# Patient Record
Sex: Male | Born: 1965 | Race: White | Hispanic: No | Smoking: Current every day smoker
Health system: Southern US, Community
[De-identification: ages and names within clinical notes are randomized; demographics above are authoritative.]

## PROBLEM LIST (undated history)

## (undated) DIAGNOSIS — M549 Dorsalgia, unspecified: Secondary | ICD-10-CM

## (undated) DIAGNOSIS — E785 Hyperlipidemia, unspecified: Secondary | ICD-10-CM

## (undated) DIAGNOSIS — E119 Type 2 diabetes mellitus without complications: Secondary | ICD-10-CM

## (undated) DIAGNOSIS — F329 Major depressive disorder, single episode, unspecified: Secondary | ICD-10-CM

## (undated) DIAGNOSIS — N200 Calculus of kidney: Secondary | ICD-10-CM

## (undated) DIAGNOSIS — K589 Irritable bowel syndrome without diarrhea: Secondary | ICD-10-CM

## (undated) DIAGNOSIS — F209 Schizophrenia, unspecified: Secondary | ICD-10-CM

## (undated) DIAGNOSIS — J45909 Unspecified asthma, uncomplicated: Secondary | ICD-10-CM

## (undated) DIAGNOSIS — F319 Bipolar disorder, unspecified: Secondary | ICD-10-CM

## (undated) DIAGNOSIS — I10 Essential (primary) hypertension: Secondary | ICD-10-CM

## (undated) DIAGNOSIS — IMO0002 Reserved for concepts with insufficient information to code with codable children: Secondary | ICD-10-CM

## (undated) DIAGNOSIS — I4510 Unspecified right bundle-branch block: Secondary | ICD-10-CM

## (undated) DIAGNOSIS — G44209 Tension-type headache, unspecified, not intractable: Secondary | ICD-10-CM

## (undated) DIAGNOSIS — T1491XA Suicide attempt, initial encounter: Secondary | ICD-10-CM

## (undated) DIAGNOSIS — R440 Auditory hallucinations: Secondary | ICD-10-CM

## (undated) DIAGNOSIS — K219 Gastro-esophageal reflux disease without esophagitis: Secondary | ICD-10-CM

## (undated) DIAGNOSIS — M1990 Unspecified osteoarthritis, unspecified site: Secondary | ICD-10-CM

## (undated) DIAGNOSIS — E669 Obesity, unspecified: Secondary | ICD-10-CM

## (undated) DIAGNOSIS — K760 Fatty (change of) liver, not elsewhere classified: Secondary | ICD-10-CM

## (undated) DIAGNOSIS — J449 Chronic obstructive pulmonary disease, unspecified: Secondary | ICD-10-CM

## (undated) DIAGNOSIS — F419 Anxiety disorder, unspecified: Secondary | ICD-10-CM

## (undated) DIAGNOSIS — G8929 Other chronic pain: Secondary | ICD-10-CM

## (undated) DIAGNOSIS — N289 Disorder of kidney and ureter, unspecified: Secondary | ICD-10-CM

## (undated) HISTORY — DX: Reserved for concepts with insufficient information to code with codable children: IMO0002

## (undated) HISTORY — DX: Major depressive disorder, single episode, unspecified: F32.9

## (undated) HISTORY — DX: Unspecified osteoarthritis, unspecified site: M19.90

## (undated) HISTORY — DX: Irritable bowel syndrome, unspecified: K58.9

## (undated) HISTORY — DX: Gastro-esophageal reflux disease without esophagitis: K21.9

## (undated) HISTORY — PX: OTHER SURGICAL HISTORY: SHX169

## (undated) HISTORY — DX: Type 2 diabetes mellitus without complications: E11.9

## (undated) HISTORY — DX: Unspecified right bundle-branch block: I45.10

## (undated) HISTORY — DX: Calculus of kidney: N20.0

## (undated) HISTORY — DX: Hyperlipidemia, unspecified: E78.5

## (undated) HISTORY — DX: Bipolar disorder, unspecified: F31.9

## (undated) HISTORY — DX: Essential (primary) hypertension: I10

## (undated) HISTORY — DX: Auditory hallucinations: R44.0

## (undated) HISTORY — DX: Tension-type headache, unspecified, not intractable: G44.209

## (undated) HISTORY — DX: Suicide attempt, initial encounter: T14.91XA

## (undated) HISTORY — DX: Dorsalgia, unspecified: M54.9

## (undated) HISTORY — DX: Anxiety disorder, unspecified: F41.9

## (undated) HISTORY — DX: Fatty (change of) liver, not elsewhere classified: K76.0

## (undated) HISTORY — DX: Schizophrenia, unspecified: F20.9

## (undated) HISTORY — DX: Obesity, unspecified: E66.9

## (undated) HISTORY — DX: Other chronic pain: G89.29

---

## 1995-02-27 DIAGNOSIS — F32A Depression, unspecified: Secondary | ICD-10-CM

## 1995-02-27 HISTORY — DX: Depression, unspecified: F32.A

## 1997-09-13 ENCOUNTER — Emergency Department (HOSPITAL_COMMUNITY): Admission: EM | Admit: 1997-09-13 | Discharge: 1997-09-13 | Payer: Self-pay | Admitting: Emergency Medicine

## 1999-02-27 HISTORY — PX: OTHER SURGICAL HISTORY: SHX169

## 2000-08-22 ENCOUNTER — Emergency Department (HOSPITAL_COMMUNITY): Admission: EM | Admit: 2000-08-22 | Discharge: 2000-08-22 | Payer: Self-pay | Admitting: Emergency Medicine

## 2000-09-25 ENCOUNTER — Inpatient Hospital Stay (HOSPITAL_COMMUNITY): Admission: EM | Admit: 2000-09-25 | Discharge: 2000-09-28 | Payer: Self-pay | Admitting: *Deleted

## 2000-11-26 ENCOUNTER — Ambulatory Visit (HOSPITAL_COMMUNITY): Admission: RE | Admit: 2000-11-26 | Discharge: 2000-11-26 | Payer: Self-pay | Admitting: Orthopaedic Surgery

## 2000-12-06 ENCOUNTER — Emergency Department (HOSPITAL_COMMUNITY): Admission: EM | Admit: 2000-12-06 | Discharge: 2000-12-06 | Payer: Self-pay | Admitting: Emergency Medicine

## 2000-12-11 ENCOUNTER — Encounter: Payer: Self-pay | Admitting: Orthopedic Surgery

## 2000-12-11 ENCOUNTER — Ambulatory Visit (HOSPITAL_COMMUNITY): Admission: RE | Admit: 2000-12-11 | Discharge: 2000-12-11 | Payer: Self-pay | Admitting: Orthopedic Surgery

## 2001-02-26 DIAGNOSIS — I10 Essential (primary) hypertension: Secondary | ICD-10-CM

## 2001-02-26 HISTORY — DX: Essential (primary) hypertension: I10

## 2002-06-19 ENCOUNTER — Emergency Department (HOSPITAL_COMMUNITY): Admission: EM | Admit: 2002-06-19 | Discharge: 2002-06-19 | Payer: Self-pay | Admitting: Internal Medicine

## 2002-06-19 ENCOUNTER — Encounter: Payer: Self-pay | Admitting: Internal Medicine

## 2003-02-27 DIAGNOSIS — T1491XA Suicide attempt, initial encounter: Secondary | ICD-10-CM

## 2003-02-27 HISTORY — DX: Suicide attempt, initial encounter: T14.91XA

## 2003-11-19 ENCOUNTER — Ambulatory Visit (HOSPITAL_COMMUNITY): Admission: RE | Admit: 2003-11-19 | Discharge: 2003-11-19 | Payer: Self-pay | Admitting: Orthopaedic Surgery

## 2003-12-14 ENCOUNTER — Inpatient Hospital Stay (HOSPITAL_COMMUNITY): Admission: RE | Admit: 2003-12-14 | Discharge: 2003-12-20 | Payer: Self-pay | Admitting: Psychiatry

## 2003-12-14 ENCOUNTER — Ambulatory Visit: Payer: Self-pay | Admitting: Psychiatry

## 2004-01-03 ENCOUNTER — Ambulatory Visit: Payer: Self-pay | Admitting: Psychiatry

## 2004-01-10 ENCOUNTER — Ambulatory Visit: Payer: Self-pay | Admitting: Psychiatry

## 2004-01-17 ENCOUNTER — Ambulatory Visit: Payer: Self-pay | Admitting: Psychiatry

## 2004-02-11 ENCOUNTER — Ambulatory Visit: Payer: Self-pay | Admitting: Psychiatry

## 2004-02-25 ENCOUNTER — Ambulatory Visit (HOSPITAL_COMMUNITY): Admission: RE | Admit: 2004-02-25 | Discharge: 2004-02-25 | Payer: Self-pay | Admitting: Family Medicine

## 2004-03-10 ENCOUNTER — Ambulatory Visit (HOSPITAL_COMMUNITY): Admission: RE | Admit: 2004-03-10 | Discharge: 2004-03-10 | Payer: Self-pay | Admitting: General Surgery

## 2004-03-24 ENCOUNTER — Ambulatory Visit: Payer: Self-pay | Admitting: Psychiatry

## 2004-05-03 ENCOUNTER — Ambulatory Visit: Payer: Self-pay | Admitting: Psychiatry

## 2004-06-15 ENCOUNTER — Ambulatory Visit: Payer: Self-pay | Admitting: Psychiatry

## 2004-06-29 ENCOUNTER — Ambulatory Visit: Payer: Self-pay | Admitting: Psychiatry

## 2004-07-17 ENCOUNTER — Emergency Department (HOSPITAL_COMMUNITY): Admission: EM | Admit: 2004-07-17 | Discharge: 2004-07-17 | Payer: Self-pay | Admitting: Emergency Medicine

## 2004-07-22 ENCOUNTER — Ambulatory Visit (HOSPITAL_COMMUNITY): Admission: RE | Admit: 2004-07-22 | Discharge: 2004-07-22 | Payer: Self-pay | Admitting: Orthopaedic Surgery

## 2004-08-09 ENCOUNTER — Ambulatory Visit: Payer: Self-pay | Admitting: Psychiatry

## 2004-08-30 ENCOUNTER — Emergency Department (HOSPITAL_COMMUNITY): Admission: EM | Admit: 2004-08-30 | Discharge: 2004-08-30 | Payer: Self-pay | Admitting: Emergency Medicine

## 2004-09-08 ENCOUNTER — Emergency Department (HOSPITAL_COMMUNITY): Admission: EM | Admit: 2004-09-08 | Discharge: 2004-09-08 | Payer: Self-pay | Admitting: Emergency Medicine

## 2004-09-12 ENCOUNTER — Ambulatory Visit: Payer: Self-pay | Admitting: Psychiatry

## 2004-10-31 ENCOUNTER — Ambulatory Visit: Payer: Self-pay | Admitting: Psychiatry

## 2004-12-10 ENCOUNTER — Emergency Department (HOSPITAL_COMMUNITY): Admission: EM | Admit: 2004-12-10 | Discharge: 2004-12-10 | Payer: Self-pay | Admitting: Emergency Medicine

## 2004-12-11 ENCOUNTER — Ambulatory Visit: Payer: Self-pay | Admitting: Psychiatry

## 2004-12-22 ENCOUNTER — Ambulatory Visit (HOSPITAL_COMMUNITY): Admission: RE | Admit: 2004-12-22 | Discharge: 2004-12-22 | Payer: Self-pay | Admitting: Orthopaedic Surgery

## 2005-01-16 ENCOUNTER — Ambulatory Visit: Payer: Self-pay | Admitting: Psychiatry

## 2005-02-05 ENCOUNTER — Ambulatory Visit: Payer: Self-pay | Admitting: Psychiatry

## 2005-03-21 ENCOUNTER — Ambulatory Visit: Payer: Self-pay | Admitting: Psychiatry

## 2005-04-04 ENCOUNTER — Ambulatory Visit: Payer: Self-pay | Admitting: Psychiatry

## 2005-05-03 ENCOUNTER — Ambulatory Visit (HOSPITAL_COMMUNITY): Payer: Self-pay | Admitting: Psychiatry

## 2005-05-15 ENCOUNTER — Ambulatory Visit (HOSPITAL_COMMUNITY): Payer: Self-pay | Admitting: Psychiatry

## 2005-05-29 ENCOUNTER — Ambulatory Visit (HOSPITAL_COMMUNITY): Payer: Self-pay | Admitting: Psychiatry

## 2005-06-07 ENCOUNTER — Ambulatory Visit (HOSPITAL_COMMUNITY): Payer: Self-pay | Admitting: Psychiatry

## 2005-06-14 ENCOUNTER — Ambulatory Visit (HOSPITAL_COMMUNITY): Payer: Self-pay | Admitting: Psychiatry

## 2005-06-28 ENCOUNTER — Ambulatory Visit (HOSPITAL_COMMUNITY): Payer: Self-pay | Admitting: Psychiatry

## 2005-07-19 ENCOUNTER — Ambulatory Visit (HOSPITAL_COMMUNITY): Payer: Self-pay | Admitting: Psychiatry

## 2005-08-07 ENCOUNTER — Ambulatory Visit (HOSPITAL_COMMUNITY): Payer: Self-pay | Admitting: Psychiatry

## 2005-08-09 ENCOUNTER — Ambulatory Visit (HOSPITAL_COMMUNITY): Payer: Self-pay | Admitting: Psychiatry

## 2005-08-30 ENCOUNTER — Ambulatory Visit (HOSPITAL_COMMUNITY): Payer: Self-pay | Admitting: Psychiatry

## 2005-09-18 ENCOUNTER — Ambulatory Visit (HOSPITAL_COMMUNITY): Payer: Self-pay | Admitting: Psychiatry

## 2005-10-02 ENCOUNTER — Ambulatory Visit (HOSPITAL_COMMUNITY): Payer: Self-pay | Admitting: Psychiatry

## 2005-10-04 ENCOUNTER — Ambulatory Visit (HOSPITAL_COMMUNITY): Payer: Self-pay | Admitting: Psychiatry

## 2005-10-08 ENCOUNTER — Ambulatory Visit: Payer: Self-pay | Admitting: Family Medicine

## 2005-10-12 ENCOUNTER — Encounter (INDEPENDENT_AMBULATORY_CARE_PROVIDER_SITE_OTHER): Payer: Self-pay | Admitting: Family Medicine

## 2005-10-22 ENCOUNTER — Ambulatory Visit: Payer: Self-pay | Admitting: Family Medicine

## 2005-10-23 ENCOUNTER — Ambulatory Visit (HOSPITAL_COMMUNITY): Payer: Self-pay | Admitting: Psychiatry

## 2005-11-06 ENCOUNTER — Ambulatory Visit (HOSPITAL_COMMUNITY): Payer: Self-pay | Admitting: Psychiatry

## 2005-11-22 ENCOUNTER — Encounter (INDEPENDENT_AMBULATORY_CARE_PROVIDER_SITE_OTHER): Payer: Self-pay | Admitting: Family Medicine

## 2005-11-27 ENCOUNTER — Ambulatory Visit (HOSPITAL_COMMUNITY): Payer: Self-pay | Admitting: Psychiatry

## 2005-11-29 ENCOUNTER — Ambulatory Visit (HOSPITAL_COMMUNITY): Payer: Self-pay | Admitting: Psychiatry

## 2005-12-03 ENCOUNTER — Ambulatory Visit: Payer: Self-pay | Admitting: Family Medicine

## 2005-12-04 ENCOUNTER — Encounter (INDEPENDENT_AMBULATORY_CARE_PROVIDER_SITE_OTHER): Payer: Self-pay | Admitting: Family Medicine

## 2005-12-17 ENCOUNTER — Ambulatory Visit: Payer: Self-pay | Admitting: Family Medicine

## 2005-12-18 ENCOUNTER — Encounter (INDEPENDENT_AMBULATORY_CARE_PROVIDER_SITE_OTHER): Payer: Self-pay | Admitting: Family Medicine

## 2005-12-18 ENCOUNTER — Ambulatory Visit (HOSPITAL_COMMUNITY): Admission: RE | Admit: 2005-12-18 | Discharge: 2005-12-18 | Payer: Self-pay | Admitting: Family Medicine

## 2005-12-19 ENCOUNTER — Ambulatory Visit (HOSPITAL_COMMUNITY): Payer: Self-pay | Admitting: Psychiatry

## 2005-12-21 ENCOUNTER — Telehealth (INDEPENDENT_AMBULATORY_CARE_PROVIDER_SITE_OTHER): Payer: Self-pay | Admitting: Family Medicine

## 2005-12-27 ENCOUNTER — Ambulatory Visit (HOSPITAL_COMMUNITY): Payer: Self-pay | Admitting: Psychiatry

## 2006-01-07 ENCOUNTER — Ambulatory Visit: Payer: Self-pay | Admitting: Family Medicine

## 2006-01-09 ENCOUNTER — Ambulatory Visit (HOSPITAL_COMMUNITY): Payer: Self-pay | Admitting: Psychiatry

## 2006-01-11 ENCOUNTER — Encounter (INDEPENDENT_AMBULATORY_CARE_PROVIDER_SITE_OTHER): Payer: Self-pay | Admitting: Family Medicine

## 2006-01-24 ENCOUNTER — Ambulatory Visit (HOSPITAL_COMMUNITY): Payer: Self-pay | Admitting: Psychiatry

## 2006-02-04 ENCOUNTER — Ambulatory Visit: Payer: Self-pay | Admitting: Family Medicine

## 2006-02-14 ENCOUNTER — Ambulatory Visit (HOSPITAL_COMMUNITY): Payer: Self-pay | Admitting: Psychiatry

## 2006-02-28 ENCOUNTER — Ambulatory Visit (HOSPITAL_COMMUNITY): Payer: Self-pay | Admitting: Psychiatry

## 2006-03-07 ENCOUNTER — Telehealth (INDEPENDENT_AMBULATORY_CARE_PROVIDER_SITE_OTHER): Payer: Self-pay | Admitting: Family Medicine

## 2006-03-07 ENCOUNTER — Ambulatory Visit (HOSPITAL_COMMUNITY): Payer: Self-pay | Admitting: Psychiatry

## 2006-03-08 ENCOUNTER — Encounter (INDEPENDENT_AMBULATORY_CARE_PROVIDER_SITE_OTHER): Payer: Self-pay | Admitting: Family Medicine

## 2006-03-18 ENCOUNTER — Ambulatory Visit: Payer: Self-pay | Admitting: Family Medicine

## 2006-04-04 ENCOUNTER — Ambulatory Visit (HOSPITAL_COMMUNITY): Payer: Self-pay | Admitting: Psychiatry

## 2006-04-29 ENCOUNTER — Ambulatory Visit: Payer: Self-pay | Admitting: Family Medicine

## 2006-04-29 ENCOUNTER — Encounter: Payer: Self-pay | Admitting: Family Medicine

## 2006-04-29 DIAGNOSIS — K7689 Other specified diseases of liver: Secondary | ICD-10-CM

## 2006-04-29 DIAGNOSIS — E663 Overweight: Secondary | ICD-10-CM

## 2006-04-29 DIAGNOSIS — K589 Irritable bowel syndrome without diarrhea: Secondary | ICD-10-CM | POA: Insufficient documentation

## 2006-04-29 DIAGNOSIS — F411 Generalized anxiety disorder: Secondary | ICD-10-CM | POA: Insufficient documentation

## 2006-04-29 DIAGNOSIS — F319 Bipolar disorder, unspecified: Secondary | ICD-10-CM

## 2006-04-29 DIAGNOSIS — E785 Hyperlipidemia, unspecified: Secondary | ICD-10-CM

## 2006-04-29 DIAGNOSIS — I10 Essential (primary) hypertension: Secondary | ICD-10-CM

## 2006-04-29 DIAGNOSIS — F329 Major depressive disorder, single episode, unspecified: Secondary | ICD-10-CM

## 2006-04-29 DIAGNOSIS — M549 Dorsalgia, unspecified: Secondary | ICD-10-CM | POA: Insufficient documentation

## 2006-04-29 LAB — CONVERTED CEMR LAB
Cholesterol, target level: 200 mg/dL
HDL goal, serum: 40 mg/dL
LDL Goal: 160 mg/dL

## 2006-05-01 ENCOUNTER — Encounter (INDEPENDENT_AMBULATORY_CARE_PROVIDER_SITE_OTHER): Payer: Self-pay | Admitting: Family Medicine

## 2006-05-02 ENCOUNTER — Ambulatory Visit (HOSPITAL_COMMUNITY): Payer: Self-pay | Admitting: Psychiatry

## 2006-05-03 LAB — CONVERTED CEMR LAB
AST: 7 units/L (ref 0–37)
Albumin: 5.1 g/dL (ref 3.5–5.2)
BUN: 19 mg/dL (ref 6–23)
Basophils Relative: 0 % (ref 0–1)
Calcium: 9.7 mg/dL (ref 8.4–10.5)
Chloride: 105 meq/L (ref 96–112)
Glucose, Bld: 91 mg/dL (ref 70–99)
HDL: 36 mg/dL — ABNORMAL LOW (ref >39)
Lymphs Abs: 3 10*3/uL (ref 0.7–3.3)
Monocytes Relative: 6 % (ref 3–11)
Neutro Abs: 5.2 10*3/uL (ref 1.7–7.7)
Neutrophils Relative %: 54 % (ref 43–77)
Potassium: 5.4 meq/L — ABNORMAL HIGH (ref 3.5–5.3)
RBC: 5.09 M/uL (ref 4.22–5.81)
TSH: 1.893 microintl units/mL (ref 0.350–5.50)
WBC: 9.7 10*3/uL (ref 4.0–10.5)

## 2006-05-06 ENCOUNTER — Ambulatory Visit (HOSPITAL_COMMUNITY): Admission: RE | Admit: 2006-05-06 | Discharge: 2006-05-06 | Payer: Self-pay | Admitting: Family Medicine

## 2006-05-06 ENCOUNTER — Encounter (INDEPENDENT_AMBULATORY_CARE_PROVIDER_SITE_OTHER): Payer: Self-pay | Admitting: Family Medicine

## 2006-05-28 ENCOUNTER — Ambulatory Visit: Payer: Self-pay | Admitting: Family Medicine

## 2006-05-29 ENCOUNTER — Encounter (INDEPENDENT_AMBULATORY_CARE_PROVIDER_SITE_OTHER): Payer: Self-pay | Admitting: Family Medicine

## 2006-05-29 ENCOUNTER — Ambulatory Visit (HOSPITAL_COMMUNITY): Admission: RE | Admit: 2006-05-29 | Discharge: 2006-05-29 | Payer: Self-pay | Admitting: Family Medicine

## 2006-05-29 LAB — CONVERTED CEMR LAB: Potassium: 4.3 meq/L (ref 3.5–5.3)

## 2006-05-30 ENCOUNTER — Ambulatory Visit (HOSPITAL_COMMUNITY): Payer: Self-pay | Admitting: Psychiatry

## 2006-05-31 ENCOUNTER — Ambulatory Visit (HOSPITAL_COMMUNITY): Payer: Self-pay | Admitting: Psychiatry

## 2006-06-04 ENCOUNTER — Encounter (INDEPENDENT_AMBULATORY_CARE_PROVIDER_SITE_OTHER): Payer: Self-pay | Admitting: Family Medicine

## 2006-06-21 ENCOUNTER — Ambulatory Visit (HOSPITAL_COMMUNITY): Payer: Self-pay | Admitting: Psychiatry

## 2006-07-03 ENCOUNTER — Encounter (INDEPENDENT_AMBULATORY_CARE_PROVIDER_SITE_OTHER): Payer: Self-pay | Admitting: Family Medicine

## 2006-07-09 ENCOUNTER — Ambulatory Visit: Payer: Self-pay | Admitting: Family Medicine

## 2006-07-10 ENCOUNTER — Encounter (INDEPENDENT_AMBULATORY_CARE_PROVIDER_SITE_OTHER): Payer: Self-pay | Admitting: Family Medicine

## 2006-07-11 LAB — CONVERTED CEMR LAB
ALT: 18 units/L (ref 0–53)
AST: 7 units/L (ref 0–37)
Albumin: 4.8 g/dL (ref 3.5–5.2)
Alkaline Phosphatase: 72 units/L (ref 39–117)
Glucose, Bld: 96 mg/dL (ref 70–99)
Potassium: 4.7 meq/L (ref 3.5–5.3)
Sodium: 135 meq/L (ref 135–145)
Total Protein: 7.3 g/dL (ref 6.0–8.3)

## 2006-07-17 ENCOUNTER — Ambulatory Visit (HOSPITAL_COMMUNITY): Payer: Self-pay | Admitting: Psychiatry

## 2006-08-07 ENCOUNTER — Ambulatory Visit (HOSPITAL_COMMUNITY): Payer: Self-pay | Admitting: Psychiatry

## 2006-08-26 ENCOUNTER — Ambulatory Visit: Payer: Self-pay | Admitting: Family Medicine

## 2006-08-27 ENCOUNTER — Ambulatory Visit (HOSPITAL_COMMUNITY): Payer: Self-pay | Admitting: Psychiatry

## 2006-08-28 ENCOUNTER — Ambulatory Visit (HOSPITAL_COMMUNITY): Payer: Self-pay | Admitting: Psychiatry

## 2006-09-17 ENCOUNTER — Ambulatory Visit (HOSPITAL_COMMUNITY): Payer: Self-pay | Admitting: Psychiatry

## 2006-10-01 ENCOUNTER — Ambulatory Visit (HOSPITAL_COMMUNITY): Payer: Self-pay | Admitting: Psychiatry

## 2006-10-04 ENCOUNTER — Encounter (INDEPENDENT_AMBULATORY_CARE_PROVIDER_SITE_OTHER): Payer: Self-pay | Admitting: Family Medicine

## 2006-10-09 ENCOUNTER — Encounter (INDEPENDENT_AMBULATORY_CARE_PROVIDER_SITE_OTHER): Payer: Self-pay | Admitting: Family Medicine

## 2006-10-14 ENCOUNTER — Ambulatory Visit (HOSPITAL_COMMUNITY): Payer: Self-pay | Admitting: Psychiatry

## 2006-10-29 ENCOUNTER — Ambulatory Visit (HOSPITAL_COMMUNITY): Payer: Self-pay | Admitting: Psychiatry

## 2006-11-11 ENCOUNTER — Ambulatory Visit (HOSPITAL_COMMUNITY): Payer: Self-pay | Admitting: Psychiatry

## 2006-11-18 ENCOUNTER — Ambulatory Visit (HOSPITAL_COMMUNITY): Payer: Self-pay | Admitting: Psychiatry

## 2006-11-21 ENCOUNTER — Ambulatory Visit: Payer: Self-pay | Admitting: Family Medicine

## 2006-11-25 ENCOUNTER — Encounter (INDEPENDENT_AMBULATORY_CARE_PROVIDER_SITE_OTHER): Payer: Self-pay | Admitting: Family Medicine

## 2006-11-28 ENCOUNTER — Encounter (INDEPENDENT_AMBULATORY_CARE_PROVIDER_SITE_OTHER): Payer: Self-pay | Admitting: Family Medicine

## 2006-11-28 ENCOUNTER — Ambulatory Visit (HOSPITAL_COMMUNITY): Payer: Self-pay | Admitting: Psychiatry

## 2006-11-29 LAB — CONVERTED CEMR LAB
Basophils Absolute: 0.1 10*3/uL (ref 0.0–0.1)
Basophils Relative: 1 % (ref 0–1)
Eosinophils Absolute: 1.1 10*3/uL — ABNORMAL HIGH (ref 0.0–0.7)
MCHC: 34.1 g/dL (ref 30.0–36.0)
Neutro Abs: 5.9 10*3/uL (ref 1.7–7.7)
Neutrophils Relative %: 55 % (ref 43–77)
Platelets: 404 10*3/uL — ABNORMAL HIGH (ref 150–400)
RBC: 4.95 M/uL (ref 4.22–5.81)
RDW: 14.8 % — ABNORMAL HIGH (ref 11.5–14.0)

## 2006-12-03 ENCOUNTER — Telehealth (INDEPENDENT_AMBULATORY_CARE_PROVIDER_SITE_OTHER): Payer: Self-pay | Admitting: *Deleted

## 2006-12-04 ENCOUNTER — Encounter (INDEPENDENT_AMBULATORY_CARE_PROVIDER_SITE_OTHER): Payer: Self-pay | Admitting: Family Medicine

## 2006-12-05 ENCOUNTER — Encounter (INDEPENDENT_AMBULATORY_CARE_PROVIDER_SITE_OTHER): Payer: Self-pay | Admitting: Family Medicine

## 2006-12-05 ENCOUNTER — Ambulatory Visit (HOSPITAL_COMMUNITY): Payer: Self-pay | Admitting: Psychiatry

## 2006-12-17 ENCOUNTER — Encounter (INDEPENDENT_AMBULATORY_CARE_PROVIDER_SITE_OTHER): Payer: Self-pay | Admitting: Family Medicine

## 2006-12-19 ENCOUNTER — Ambulatory Visit: Payer: Self-pay | Admitting: Family Medicine

## 2006-12-20 ENCOUNTER — Ambulatory Visit (HOSPITAL_COMMUNITY): Payer: Self-pay | Admitting: Psychiatry

## 2007-01-03 ENCOUNTER — Ambulatory Visit (HOSPITAL_COMMUNITY): Payer: Self-pay | Admitting: Psychiatry

## 2007-01-14 ENCOUNTER — Encounter (INDEPENDENT_AMBULATORY_CARE_PROVIDER_SITE_OTHER): Payer: Self-pay | Admitting: Family Medicine

## 2007-01-17 ENCOUNTER — Ambulatory Visit (HOSPITAL_COMMUNITY): Payer: Self-pay | Admitting: Psychiatry

## 2007-01-20 ENCOUNTER — Encounter (INDEPENDENT_AMBULATORY_CARE_PROVIDER_SITE_OTHER): Payer: Self-pay | Admitting: Family Medicine

## 2007-01-31 ENCOUNTER — Ambulatory Visit (HOSPITAL_COMMUNITY): Payer: Self-pay | Admitting: Psychiatry

## 2007-02-06 ENCOUNTER — Ambulatory Visit (HOSPITAL_COMMUNITY): Payer: Self-pay | Admitting: Psychiatry

## 2007-02-07 ENCOUNTER — Ambulatory Visit (HOSPITAL_COMMUNITY): Admission: RE | Admit: 2007-02-07 | Discharge: 2007-02-07 | Payer: Self-pay | Admitting: Family Medicine

## 2007-02-07 ENCOUNTER — Ambulatory Visit: Payer: Self-pay | Admitting: Family Medicine

## 2007-02-08 ENCOUNTER — Encounter (INDEPENDENT_AMBULATORY_CARE_PROVIDER_SITE_OTHER): Payer: Self-pay | Admitting: Family Medicine

## 2007-02-08 LAB — CONVERTED CEMR LAB
ALT: 34 units/L (ref 0–53)
AST: 10 units/L (ref 0–37)
Albumin: 4.3 g/dL (ref 3.5–5.2)
Amylase: 17 units/L (ref 0–105)
BUN: 15 mg/dL (ref 6–23)
Basophils Absolute: 0 10*3/uL (ref 0.0–0.1)
CO2: 23 meq/L (ref 19–32)
Calcium: 8.9 mg/dL (ref 8.4–10.5)
Chloride: 104 meq/L (ref 96–112)
Creatinine, Ser: 1.13 mg/dL (ref 0.40–1.50)
Hemoglobin: 14.4 g/dL (ref 13.0–17.0)
Lymphocytes Relative: 28 % (ref 12–46)
Monocytes Absolute: 0.8 10*3/uL (ref 0.1–1.0)
Monocytes Relative: 6 % (ref 3–12)
Neutro Abs: 6.9 10*3/uL (ref 1.7–7.7)
Neutrophils Relative %: 57 % (ref 43–77)
Potassium: 3.9 meq/L (ref 3.5–5.3)
RBC: 4.51 M/uL (ref 4.22–5.81)
WBC: 12.1 10*3/uL — ABNORMAL HIGH (ref 4.0–10.5)

## 2007-02-10 ENCOUNTER — Encounter (INDEPENDENT_AMBULATORY_CARE_PROVIDER_SITE_OTHER): Payer: Self-pay | Admitting: Family Medicine

## 2007-02-10 ENCOUNTER — Telehealth (INDEPENDENT_AMBULATORY_CARE_PROVIDER_SITE_OTHER): Payer: Self-pay | Admitting: *Deleted

## 2007-02-13 ENCOUNTER — Ambulatory Visit (HOSPITAL_COMMUNITY): Payer: Self-pay | Admitting: Psychiatry

## 2007-02-14 ENCOUNTER — Ambulatory Visit (HOSPITAL_COMMUNITY): Payer: Self-pay | Admitting: Psychiatry

## 2007-02-14 ENCOUNTER — Ambulatory Visit: Payer: Self-pay | Admitting: Family Medicine

## 2007-02-27 HISTORY — PX: OTHER SURGICAL HISTORY: SHX169

## 2007-02-28 ENCOUNTER — Ambulatory Visit (HOSPITAL_COMMUNITY): Payer: Self-pay | Admitting: Psychiatry

## 2007-03-06 ENCOUNTER — Ambulatory Visit (HOSPITAL_COMMUNITY): Payer: Self-pay | Admitting: Psychiatry

## 2007-03-14 ENCOUNTER — Ambulatory Visit: Payer: Self-pay | Admitting: Family Medicine

## 2007-03-14 ENCOUNTER — Ambulatory Visit (HOSPITAL_COMMUNITY): Payer: Self-pay | Admitting: Psychiatry

## 2007-03-15 ENCOUNTER — Encounter (INDEPENDENT_AMBULATORY_CARE_PROVIDER_SITE_OTHER): Payer: Self-pay | Admitting: Family Medicine

## 2007-03-15 LAB — CONVERTED CEMR LAB
Eosinophils Absolute: 1.3 10*3/uL — ABNORMAL HIGH (ref 0.0–0.7)
Eosinophils Relative: 10 % — ABNORMAL HIGH (ref 0–5)
HCT: 46.1 % (ref 39.0–52.0)
Hemoglobin: 15.6 g/dL (ref 13.0–17.0)
Lymphocytes Relative: 26 % (ref 12–46)
Lymphs Abs: 3.2 10*3/uL (ref 0.7–4.0)
MCV: 93.1 fL (ref 78.0–100.0)
Monocytes Absolute: 0.8 10*3/uL (ref 0.1–1.0)
Monocytes Relative: 6 % (ref 3–12)
RBC: 4.95 M/uL (ref 4.22–5.81)
WBC: 12.2 10*3/uL — ABNORMAL HIGH (ref 4.0–10.5)

## 2007-03-17 ENCOUNTER — Telehealth (INDEPENDENT_AMBULATORY_CARE_PROVIDER_SITE_OTHER): Payer: Self-pay | Admitting: *Deleted

## 2007-03-17 ENCOUNTER — Encounter (INDEPENDENT_AMBULATORY_CARE_PROVIDER_SITE_OTHER): Payer: Self-pay | Admitting: Family Medicine

## 2007-03-18 ENCOUNTER — Telehealth (INDEPENDENT_AMBULATORY_CARE_PROVIDER_SITE_OTHER): Payer: Self-pay | Admitting: *Deleted

## 2007-03-28 ENCOUNTER — Ambulatory Visit (HOSPITAL_COMMUNITY): Payer: Self-pay | Admitting: Psychiatry

## 2007-04-03 ENCOUNTER — Ambulatory Visit (HOSPITAL_COMMUNITY): Payer: Self-pay | Admitting: Psychiatry

## 2007-04-08 ENCOUNTER — Encounter (INDEPENDENT_AMBULATORY_CARE_PROVIDER_SITE_OTHER): Payer: Self-pay | Admitting: Family Medicine

## 2007-04-10 ENCOUNTER — Telehealth (INDEPENDENT_AMBULATORY_CARE_PROVIDER_SITE_OTHER): Payer: Self-pay | Admitting: *Deleted

## 2007-04-17 ENCOUNTER — Ambulatory Visit (HOSPITAL_COMMUNITY): Payer: Self-pay | Admitting: Psychiatry

## 2007-04-25 ENCOUNTER — Encounter (INDEPENDENT_AMBULATORY_CARE_PROVIDER_SITE_OTHER): Payer: Self-pay | Admitting: Family Medicine

## 2007-05-01 ENCOUNTER — Ambulatory Visit (HOSPITAL_COMMUNITY): Payer: Self-pay | Admitting: Psychiatry

## 2007-05-13 ENCOUNTER — Ambulatory Visit (HOSPITAL_COMMUNITY): Payer: Self-pay | Admitting: Psychiatry

## 2007-05-22 ENCOUNTER — Ambulatory Visit: Payer: Self-pay | Admitting: Family Medicine

## 2007-05-23 LAB — CONVERTED CEMR LAB
Basophils Relative: 1 % (ref 0–1)
Eosinophils Absolute: 1.3 10*3/uL — ABNORMAL HIGH (ref 0.0–0.7)
Eosinophils Relative: 10 % — ABNORMAL HIGH (ref 0–5)
HCT: 42.9 % (ref 39.0–52.0)
Lymphs Abs: 3.1 10*3/uL (ref 0.7–4.0)
MCHC: 35.4 g/dL (ref 30.0–36.0)
MCV: 91.5 fL (ref 78.0–100.0)
Neutrophils Relative %: 59 % (ref 43–77)
Platelets: 477 10*3/uL — ABNORMAL HIGH (ref 150–400)
WBC: 12.4 10*3/uL — ABNORMAL HIGH (ref 4.0–10.5)

## 2007-05-27 ENCOUNTER — Telehealth (INDEPENDENT_AMBULATORY_CARE_PROVIDER_SITE_OTHER): Payer: Self-pay | Admitting: *Deleted

## 2007-05-27 ENCOUNTER — Ambulatory Visit (HOSPITAL_COMMUNITY): Payer: Self-pay | Admitting: Psychiatry

## 2007-06-05 ENCOUNTER — Ambulatory Visit: Payer: Self-pay | Admitting: Family Medicine

## 2007-06-05 ENCOUNTER — Ambulatory Visit (HOSPITAL_COMMUNITY): Admission: RE | Admit: 2007-06-05 | Discharge: 2007-06-05 | Payer: Self-pay | Admitting: Family Medicine

## 2007-06-05 ENCOUNTER — Telehealth (INDEPENDENT_AMBULATORY_CARE_PROVIDER_SITE_OTHER): Payer: Self-pay | Admitting: *Deleted

## 2007-06-10 ENCOUNTER — Ambulatory Visit (HOSPITAL_COMMUNITY): Payer: Self-pay | Admitting: Psychiatry

## 2007-07-02 ENCOUNTER — Ambulatory Visit (HOSPITAL_COMMUNITY): Payer: Self-pay | Admitting: Psychiatry

## 2007-07-03 ENCOUNTER — Ambulatory Visit (HOSPITAL_COMMUNITY): Payer: Self-pay | Admitting: Psychiatry

## 2007-07-10 ENCOUNTER — Ambulatory Visit (HOSPITAL_COMMUNITY): Payer: Self-pay | Admitting: Psychiatry

## 2007-07-18 ENCOUNTER — Ambulatory Visit (HOSPITAL_COMMUNITY): Payer: Self-pay | Admitting: Psychiatry

## 2007-07-25 ENCOUNTER — Ambulatory Visit (HOSPITAL_COMMUNITY): Payer: Self-pay | Admitting: Psychiatry

## 2007-08-01 ENCOUNTER — Ambulatory Visit (HOSPITAL_COMMUNITY): Payer: Self-pay | Admitting: Psychiatry

## 2007-08-01 ENCOUNTER — Ambulatory Visit: Payer: Self-pay | Admitting: Family Medicine

## 2007-08-09 ENCOUNTER — Inpatient Hospital Stay (HOSPITAL_COMMUNITY): Admission: EM | Admit: 2007-08-09 | Discharge: 2007-08-25 | Payer: Self-pay | Admitting: Emergency Medicine

## 2007-08-13 ENCOUNTER — Encounter (INDEPENDENT_AMBULATORY_CARE_PROVIDER_SITE_OTHER): Payer: Self-pay | Admitting: Orthopedic Surgery

## 2007-08-14 ENCOUNTER — Ambulatory Visit: Payer: Self-pay | Admitting: Internal Medicine

## 2007-08-15 ENCOUNTER — Encounter (INDEPENDENT_AMBULATORY_CARE_PROVIDER_SITE_OTHER): Payer: Self-pay | Admitting: Family Medicine

## 2007-08-17 ENCOUNTER — Encounter (INDEPENDENT_AMBULATORY_CARE_PROVIDER_SITE_OTHER): Payer: Self-pay | Admitting: Internal Medicine

## 2007-09-01 ENCOUNTER — Ambulatory Visit: Payer: Self-pay | Admitting: Family Medicine

## 2007-09-01 DIAGNOSIS — S41109A Unspecified open wound of unspecified upper arm, initial encounter: Secondary | ICD-10-CM | POA: Insufficient documentation

## 2007-09-01 DIAGNOSIS — S46999A Other injury of unspecified muscle, fascia and tendon at shoulder and upper arm level, unspecified arm, initial encounter: Secondary | ICD-10-CM

## 2007-09-02 LAB — CONVERTED CEMR LAB
Basophils Relative: 1 % (ref 0–1)
Chloride: 106 meq/L (ref 96–112)
Eosinophils Absolute: 0.8 10*3/uL — ABNORMAL HIGH (ref 0.0–0.7)
Lymphs Abs: 2.5 10*3/uL (ref 0.7–4.0)
MCHC: 30.2 g/dL (ref 30.0–36.0)
MCV: 96.4 fL (ref 78.0–100.0)
Neutro Abs: 6 10*3/uL (ref 1.7–7.7)
Neutrophils Relative %: 60 % (ref 43–77)
Platelets: 594 10*3/uL — ABNORMAL HIGH (ref 150–400)
Potassium: 4.3 meq/L (ref 3.5–5.3)
RBC: 3.36 M/uL — ABNORMAL LOW (ref 4.22–5.81)
Sodium: 140 meq/L (ref 135–145)
WBC: 10.1 10*3/uL (ref 4.0–10.5)

## 2007-09-08 ENCOUNTER — Ambulatory Visit (HOSPITAL_COMMUNITY): Payer: Self-pay | Admitting: Psychiatry

## 2007-09-15 ENCOUNTER — Ambulatory Visit: Payer: Self-pay | Admitting: Family Medicine

## 2007-09-16 ENCOUNTER — Ambulatory Visit (HOSPITAL_COMMUNITY): Payer: Self-pay | Admitting: Psychiatry

## 2007-09-16 LAB — CONVERTED CEMR LAB
Eosinophils Absolute: 1.4 10*3/uL — ABNORMAL HIGH (ref 0.0–0.7)
Iron: 58 ug/dL (ref 42–165)
Lymphocytes Relative: 29 % (ref 12–46)
Lymphs Abs: 2.9 10*3/uL (ref 0.7–4.0)
Monocytes Relative: 6 % (ref 3–12)
Neutro Abs: 5.2 10*3/uL (ref 1.7–7.7)
Neutrophils Relative %: 52 % (ref 43–77)
Platelets: 490 10*3/uL — ABNORMAL HIGH (ref 150–400)
RBC: 3.92 M/uL — ABNORMAL LOW (ref 4.22–5.81)
Retic Ct Pct: 2.5 % (ref 0.4–3.1)
Saturation Ratios: 18 % — ABNORMAL LOW (ref 20–55)
UIBC: 269 ug/dL
WBC: 10 10*3/uL (ref 4.0–10.5)

## 2007-09-22 ENCOUNTER — Ambulatory Visit (HOSPITAL_COMMUNITY): Payer: Self-pay | Admitting: Psychiatry

## 2007-09-29 ENCOUNTER — Ambulatory Visit: Payer: Self-pay | Admitting: Family Medicine

## 2007-10-01 ENCOUNTER — Encounter (INDEPENDENT_AMBULATORY_CARE_PROVIDER_SITE_OTHER): Payer: Self-pay | Admitting: Family Medicine

## 2007-10-06 ENCOUNTER — Ambulatory Visit (HOSPITAL_COMMUNITY): Payer: Self-pay | Admitting: Psychiatry

## 2007-10-06 LAB — CONVERTED CEMR LAB
ALT: 34 units/L (ref 0–53)
Albumin: 4.8 g/dL (ref 3.5–5.2)
CO2: 21 meq/L (ref 19–32)
Chloride: 104 meq/L (ref 96–112)
Cholesterol: 253 mg/dL — ABNORMAL HIGH (ref 0–200)
Potassium: 4.6 meq/L (ref 3.5–5.3)
Sodium: 139 meq/L (ref 135–145)
Total Bilirubin: 0.4 mg/dL (ref 0.3–1.2)
Total Protein: 7.9 g/dL (ref 6.0–8.3)
VLDL: 48 mg/dL — ABNORMAL HIGH (ref 0–40)

## 2007-10-16 ENCOUNTER — Ambulatory Visit (HOSPITAL_COMMUNITY): Payer: Self-pay | Admitting: Psychiatry

## 2007-10-20 ENCOUNTER — Ambulatory Visit (HOSPITAL_COMMUNITY): Payer: Self-pay | Admitting: Psychiatry

## 2007-11-02 ENCOUNTER — Encounter (INDEPENDENT_AMBULATORY_CARE_PROVIDER_SITE_OTHER): Payer: Self-pay | Admitting: Family Medicine

## 2007-11-06 ENCOUNTER — Ambulatory Visit (HOSPITAL_COMMUNITY): Payer: Self-pay | Admitting: Psychiatry

## 2007-11-07 ENCOUNTER — Ambulatory Visit (HOSPITAL_COMMUNITY): Payer: Self-pay | Admitting: Psychiatry

## 2007-11-10 ENCOUNTER — Ambulatory Visit: Payer: Self-pay | Admitting: Family Medicine

## 2007-11-10 LAB — CONVERTED CEMR LAB
Bilirubin Urine: NEGATIVE
Blood in Urine, dipstick: NEGATIVE
Glucose, Urine, Semiquant: NEGATIVE
Ketones, urine, test strip: NEGATIVE
Nitrite: POSITIVE
Specific Gravity, Urine: 1.01

## 2007-11-28 ENCOUNTER — Ambulatory Visit (HOSPITAL_COMMUNITY): Payer: Self-pay | Admitting: Psychiatry

## 2007-12-09 ENCOUNTER — Encounter (INDEPENDENT_AMBULATORY_CARE_PROVIDER_SITE_OTHER): Payer: Self-pay | Admitting: Family Medicine

## 2007-12-15 ENCOUNTER — Encounter (HOSPITAL_COMMUNITY): Admission: RE | Admit: 2007-12-15 | Discharge: 2008-01-14 | Payer: Self-pay | Admitting: Neurology

## 2007-12-19 ENCOUNTER — Ambulatory Visit (HOSPITAL_COMMUNITY): Payer: Self-pay | Admitting: Psychiatry

## 2007-12-22 ENCOUNTER — Ambulatory Visit: Payer: Self-pay | Admitting: Family Medicine

## 2007-12-22 LAB — CONVERTED CEMR LAB
Blood Glucose, Fasting: 80 mg/dL
Glucose, Urine, Semiquant: NEGATIVE
Protein, U semiquant: 30
Specific Gravity, Urine: 1.01
WBC Urine, dipstick: NEGATIVE
pH: 5.5

## 2008-01-02 ENCOUNTER — Ambulatory Visit (HOSPITAL_COMMUNITY): Payer: Self-pay | Admitting: Psychiatry

## 2008-01-15 ENCOUNTER — Encounter (HOSPITAL_COMMUNITY): Admission: RE | Admit: 2008-01-15 | Discharge: 2008-02-26 | Payer: Self-pay | Admitting: Neurology

## 2008-01-15 ENCOUNTER — Ambulatory Visit (HOSPITAL_COMMUNITY): Payer: Self-pay | Admitting: Psychiatry

## 2008-01-19 ENCOUNTER — Ambulatory Visit: Payer: Self-pay | Admitting: Family Medicine

## 2008-02-03 ENCOUNTER — Ambulatory Visit (HOSPITAL_COMMUNITY): Payer: Self-pay | Admitting: Psychiatry

## 2008-02-06 ENCOUNTER — Ambulatory Visit (HOSPITAL_COMMUNITY): Payer: Self-pay | Admitting: Psychiatry

## 2008-02-25 ENCOUNTER — Ambulatory Visit (HOSPITAL_COMMUNITY): Payer: Self-pay | Admitting: Psychiatry

## 2008-03-02 ENCOUNTER — Encounter (HOSPITAL_COMMUNITY): Admission: RE | Admit: 2008-03-02 | Discharge: 2008-03-19 | Payer: Self-pay | Admitting: Neurology

## 2008-03-10 ENCOUNTER — Ambulatory Visit (HOSPITAL_COMMUNITY): Payer: Self-pay | Admitting: Psychiatry

## 2008-03-11 ENCOUNTER — Ambulatory Visit (HOSPITAL_COMMUNITY): Payer: Self-pay | Admitting: Psychiatry

## 2008-03-19 ENCOUNTER — Ambulatory Visit (HOSPITAL_COMMUNITY): Payer: Self-pay | Admitting: Psychiatry

## 2008-03-31 ENCOUNTER — Ambulatory Visit (HOSPITAL_COMMUNITY): Payer: Self-pay | Admitting: Psychiatry

## 2008-04-08 ENCOUNTER — Ambulatory Visit (HOSPITAL_COMMUNITY): Payer: Self-pay | Admitting: Psychiatry

## 2008-04-12 ENCOUNTER — Ambulatory Visit: Payer: Self-pay | Admitting: Family Medicine

## 2008-04-20 ENCOUNTER — Ambulatory Visit (HOSPITAL_COMMUNITY): Payer: Self-pay | Admitting: Psychiatry

## 2008-04-26 ENCOUNTER — Ambulatory Visit: Payer: Self-pay | Admitting: Family Medicine

## 2008-04-27 ENCOUNTER — Ambulatory Visit (HOSPITAL_COMMUNITY): Payer: Self-pay | Admitting: Psychiatry

## 2008-04-27 ENCOUNTER — Encounter (INDEPENDENT_AMBULATORY_CARE_PROVIDER_SITE_OTHER): Payer: Self-pay | Admitting: Family Medicine

## 2008-04-29 ENCOUNTER — Encounter (INDEPENDENT_AMBULATORY_CARE_PROVIDER_SITE_OTHER): Payer: Self-pay | Admitting: *Deleted

## 2008-04-29 ENCOUNTER — Emergency Department (HOSPITAL_COMMUNITY): Admission: EM | Admit: 2008-04-29 | Discharge: 2008-04-29 | Payer: Self-pay | Admitting: Emergency Medicine

## 2008-04-29 LAB — CONVERTED CEMR LAB
ALT: 20 units/L (ref 0–53)
AST: 7 units/L (ref 0–37)
Band Neutrophils: 0 % (ref 0–10)
Basophils Relative: 0 % (ref 0–1)
Cholesterol: 290 mg/dL — ABNORMAL HIGH (ref 0–200)
Creatinine, Ser: 1.24 mg/dL (ref 0.40–1.50)
Eosinophils Absolute: 1.2 10*3/uL — ABNORMAL HIGH (ref 0.0–0.7)
MCHC: 34.2 g/dL (ref 30.0–36.0)
Neutrophils Relative %: 50 % (ref 43–77)
RDW: 13.8 % (ref 11.5–15.5)
Total Bilirubin: 0.3 mg/dL (ref 0.3–1.2)
Total CHOL/HDL Ratio: 10
VLDL: 77 mg/dL — ABNORMAL HIGH (ref 0–40)

## 2008-04-30 ENCOUNTER — Encounter (INDEPENDENT_AMBULATORY_CARE_PROVIDER_SITE_OTHER): Payer: Self-pay | Admitting: Internal Medicine

## 2008-04-30 ENCOUNTER — Encounter (INDEPENDENT_AMBULATORY_CARE_PROVIDER_SITE_OTHER): Payer: Self-pay | Admitting: Family Medicine

## 2008-05-06 ENCOUNTER — Encounter (INDEPENDENT_AMBULATORY_CARE_PROVIDER_SITE_OTHER): Payer: Self-pay | Admitting: Family Medicine

## 2008-05-06 ENCOUNTER — Ambulatory Visit (HOSPITAL_COMMUNITY): Admission: RE | Admit: 2008-05-06 | Discharge: 2008-05-06 | Payer: Self-pay | Admitting: Family Medicine

## 2008-05-17 ENCOUNTER — Ambulatory Visit (HOSPITAL_COMMUNITY): Payer: Self-pay | Admitting: Psychiatry

## 2008-05-18 ENCOUNTER — Telehealth (INDEPENDENT_AMBULATORY_CARE_PROVIDER_SITE_OTHER): Payer: Self-pay | Admitting: *Deleted

## 2008-05-21 ENCOUNTER — Telehealth (INDEPENDENT_AMBULATORY_CARE_PROVIDER_SITE_OTHER): Payer: Self-pay | Admitting: *Deleted

## 2008-05-31 ENCOUNTER — Ambulatory Visit: Payer: Self-pay | Admitting: Family Medicine

## 2008-05-31 DIAGNOSIS — F17200 Nicotine dependence, unspecified, uncomplicated: Secondary | ICD-10-CM

## 2008-05-31 DIAGNOSIS — J449 Chronic obstructive pulmonary disease, unspecified: Secondary | ICD-10-CM

## 2008-05-31 DIAGNOSIS — J301 Allergic rhinitis due to pollen: Secondary | ICD-10-CM | POA: Insufficient documentation

## 2008-05-31 DIAGNOSIS — D759 Disease of blood and blood-forming organs, unspecified: Secondary | ICD-10-CM | POA: Insufficient documentation

## 2008-05-31 DIAGNOSIS — K219 Gastro-esophageal reflux disease without esophagitis: Secondary | ICD-10-CM | POA: Insufficient documentation

## 2008-06-03 ENCOUNTER — Ambulatory Visit (HOSPITAL_COMMUNITY): Payer: Self-pay | Admitting: Psychiatry

## 2008-06-07 ENCOUNTER — Ambulatory Visit (HOSPITAL_COMMUNITY): Payer: Self-pay | Admitting: Psychiatry

## 2008-06-22 ENCOUNTER — Ambulatory Visit (HOSPITAL_COMMUNITY): Payer: Self-pay | Admitting: Psychiatry

## 2008-06-22 ENCOUNTER — Encounter (INDEPENDENT_AMBULATORY_CARE_PROVIDER_SITE_OTHER): Payer: Self-pay | Admitting: Family Medicine

## 2008-07-06 ENCOUNTER — Ambulatory Visit (HOSPITAL_COMMUNITY): Payer: Self-pay | Admitting: Psychiatry

## 2008-07-12 ENCOUNTER — Ambulatory Visit: Payer: Self-pay | Admitting: Family Medicine

## 2008-07-13 ENCOUNTER — Encounter (INDEPENDENT_AMBULATORY_CARE_PROVIDER_SITE_OTHER): Payer: Self-pay | Admitting: Family Medicine

## 2008-07-22 ENCOUNTER — Emergency Department (HOSPITAL_COMMUNITY): Admission: EM | Admit: 2008-07-22 | Discharge: 2008-07-22 | Payer: Self-pay | Admitting: Emergency Medicine

## 2008-08-02 ENCOUNTER — Ambulatory Visit: Payer: Self-pay | Admitting: Family Medicine

## 2008-08-02 LAB — CONVERTED CEMR LAB
Basophils Absolute: 0 10*3/uL (ref 0.0–0.1)
Basophils Relative: 0 % (ref 0–1)
Eosinophils Absolute: 1.1 10*3/uL — ABNORMAL HIGH (ref 0.0–0.7)
Lymphs Abs: 3.5 10*3/uL (ref 0.7–4.0)
MCV: 87.7 fL (ref 78.0–100.0)
Neutrophils Relative %: 50 % (ref 43–77)
Platelets: 410 10*3/uL — ABNORMAL HIGH (ref 150–400)
RDW: 14.1 % (ref 11.5–15.5)
WBC: 10.9 10*3/uL — ABNORMAL HIGH (ref 4.0–10.5)

## 2008-08-03 ENCOUNTER — Ambulatory Visit (HOSPITAL_COMMUNITY): Payer: Self-pay | Admitting: Psychiatry

## 2008-08-09 ENCOUNTER — Ambulatory Visit: Payer: Self-pay | Admitting: Family Medicine

## 2008-08-09 ENCOUNTER — Ambulatory Visit (HOSPITAL_COMMUNITY): Payer: Self-pay | Admitting: Psychiatry

## 2008-08-11 ENCOUNTER — Encounter (INDEPENDENT_AMBULATORY_CARE_PROVIDER_SITE_OTHER): Payer: Self-pay | Admitting: Family Medicine

## 2008-08-26 ENCOUNTER — Ambulatory Visit (HOSPITAL_COMMUNITY): Payer: Self-pay | Admitting: Psychiatry

## 2008-08-31 ENCOUNTER — Emergency Department (HOSPITAL_COMMUNITY): Admission: EM | Admit: 2008-08-31 | Discharge: 2008-08-31 | Payer: Self-pay | Admitting: Emergency Medicine

## 2008-09-10 ENCOUNTER — Ambulatory Visit (HOSPITAL_COMMUNITY): Payer: Self-pay | Admitting: Psychiatry

## 2008-09-24 ENCOUNTER — Ambulatory Visit (HOSPITAL_COMMUNITY): Payer: Self-pay | Admitting: Psychiatry

## 2008-09-29 ENCOUNTER — Encounter (HOSPITAL_COMMUNITY): Admission: RE | Admit: 2008-09-29 | Discharge: 2008-10-29 | Payer: Self-pay | Admitting: Sports Medicine

## 2008-09-30 ENCOUNTER — Ambulatory Visit (HOSPITAL_COMMUNITY): Payer: Self-pay | Admitting: Psychiatry

## 2008-10-07 ENCOUNTER — Ambulatory Visit (HOSPITAL_COMMUNITY): Payer: Self-pay | Admitting: Psychiatry

## 2008-10-28 ENCOUNTER — Ambulatory Visit: Payer: Self-pay | Admitting: Family Medicine

## 2008-10-28 DIAGNOSIS — R490 Dysphonia: Secondary | ICD-10-CM | POA: Insufficient documentation

## 2008-10-28 DIAGNOSIS — R55 Syncope and collapse: Secondary | ICD-10-CM | POA: Insufficient documentation

## 2008-10-28 DIAGNOSIS — R1013 Epigastric pain: Secondary | ICD-10-CM | POA: Insufficient documentation

## 2008-10-28 DIAGNOSIS — E162 Hypoglycemia, unspecified: Secondary | ICD-10-CM

## 2008-11-02 ENCOUNTER — Ambulatory Visit (HOSPITAL_COMMUNITY): Admission: RE | Admit: 2008-11-02 | Discharge: 2008-11-02 | Payer: Self-pay | Admitting: Family Medicine

## 2008-11-02 ENCOUNTER — Ambulatory Visit (HOSPITAL_COMMUNITY): Payer: Self-pay | Admitting: Psychiatry

## 2008-11-04 ENCOUNTER — Ambulatory Visit (HOSPITAL_COMMUNITY): Payer: Self-pay | Admitting: Psychiatry

## 2008-11-08 LAB — CONVERTED CEMR LAB
AST: 5 units/L (ref 0–37)
Albumin: 3.8 g/dL (ref 3.5–5.2)
Alkaline Phosphatase: 96 units/L (ref 39–117)
Amphetamine Screen, Ur: NEGATIVE
Amylase: 12 units/L (ref 0–105)
Barbiturate Quant, Ur: NEGATIVE
Basophils Absolute: 0 10*3/uL (ref 0.0–0.1)
Basophils Relative: 0 % (ref 0–1)
Cocaine Metabolites: NEGATIVE
Creatinine,U: 82.1 mg/dL
Hemoglobin, Urine: NEGATIVE
Hemoglobin: 13.3 g/dL (ref 13.0–17.0)
LDL Cholesterol: 84 mg/dL (ref 0–99)
Leukocytes, UA: NEGATIVE
Lymphocytes Relative: 24 % (ref 12–46)
Neutro Abs: 6.3 10*3/uL (ref 1.7–7.7)
Neutrophils Relative %: 56 % (ref 43–77)
Nitrite: NEGATIVE
Phencyclidine (PCP): NEGATIVE
Platelets: 434 10*3/uL — ABNORMAL HIGH (ref 150–400)
Potassium: 4.3 meq/L (ref 3.5–5.3)
Prolactin: 3.6 ng/mL (ref 2.1–17.1)
RDW: 14.5 % (ref 11.5–15.5)
Sodium: 140 meq/L (ref 135–145)
Specific Gravity, Urine: 1.008 (ref 1.005–1.030)
TSH: 1.302 microintl units/mL (ref 0.350–4.500)
Total Bilirubin: 0.3 mg/dL (ref 0.3–1.2)
Total Protein: 6.2 g/dL (ref 6.0–8.3)
Urine Glucose: NEGATIVE mg/dL
VLDL: 54 mg/dL — ABNORMAL HIGH (ref 0–40)
pH: 7 (ref 5.0–8.0)

## 2008-11-09 ENCOUNTER — Ambulatory Visit (HOSPITAL_COMMUNITY): Payer: Self-pay | Admitting: Psychiatry

## 2008-11-11 ENCOUNTER — Ambulatory Visit: Payer: Self-pay | Admitting: Family Medicine

## 2008-11-11 ENCOUNTER — Encounter (INDEPENDENT_AMBULATORY_CARE_PROVIDER_SITE_OTHER): Payer: Self-pay | Admitting: *Deleted

## 2008-11-12 ENCOUNTER — Encounter (INDEPENDENT_AMBULATORY_CARE_PROVIDER_SITE_OTHER): Payer: Self-pay | Admitting: Family Medicine

## 2008-11-12 ENCOUNTER — Encounter (INDEPENDENT_AMBULATORY_CARE_PROVIDER_SITE_OTHER): Payer: Self-pay | Admitting: *Deleted

## 2008-11-15 ENCOUNTER — Encounter (INDEPENDENT_AMBULATORY_CARE_PROVIDER_SITE_OTHER): Payer: Self-pay | Admitting: Family Medicine

## 2008-11-26 ENCOUNTER — Ambulatory Visit (HOSPITAL_COMMUNITY): Payer: Self-pay | Admitting: Psychiatry

## 2008-12-06 ENCOUNTER — Telehealth: Payer: Self-pay | Admitting: Family Medicine

## 2008-12-10 ENCOUNTER — Ambulatory Visit (HOSPITAL_COMMUNITY): Payer: Self-pay | Admitting: Psychiatry

## 2008-12-16 ENCOUNTER — Ambulatory Visit (HOSPITAL_COMMUNITY): Payer: Self-pay | Admitting: Psychiatry

## 2009-01-03 ENCOUNTER — Ambulatory Visit: Payer: Self-pay | Admitting: Family Medicine

## 2009-01-06 ENCOUNTER — Ambulatory Visit (HOSPITAL_COMMUNITY): Payer: Self-pay | Admitting: Psychiatry

## 2009-01-11 ENCOUNTER — Encounter: Payer: Self-pay | Admitting: Family Medicine

## 2009-01-19 ENCOUNTER — Ambulatory Visit (HOSPITAL_COMMUNITY): Payer: Self-pay | Admitting: Psychiatry

## 2009-01-27 ENCOUNTER — Encounter: Payer: Self-pay | Admitting: Family Medicine

## 2009-02-02 ENCOUNTER — Ambulatory Visit: Payer: Self-pay | Admitting: Family Medicine

## 2009-02-02 DIAGNOSIS — M79609 Pain in unspecified limb: Secondary | ICD-10-CM | POA: Insufficient documentation

## 2009-02-03 ENCOUNTER — Ambulatory Visit (HOSPITAL_COMMUNITY): Payer: Self-pay | Admitting: Psychiatry

## 2009-02-15 ENCOUNTER — Ambulatory Visit (HOSPITAL_COMMUNITY): Payer: Self-pay | Admitting: Psychiatry

## 2009-03-03 ENCOUNTER — Encounter: Payer: Self-pay | Admitting: Family Medicine

## 2009-03-03 ENCOUNTER — Ambulatory Visit (HOSPITAL_COMMUNITY): Payer: Self-pay | Admitting: Psychiatry

## 2009-04-01 ENCOUNTER — Encounter: Payer: Self-pay | Admitting: Family Medicine

## 2009-04-01 LAB — CONVERTED CEMR LAB
AST: 7 units/L (ref 0–37)
Albumin: 4.5 g/dL (ref 3.5–5.2)
Bilirubin, Direct: 0.1 mg/dL (ref 0.0–0.3)
CO2: 25 meq/L (ref 19–32)
Calcium: 9.6 mg/dL (ref 8.4–10.5)
Chloride: 101 meq/L (ref 96–112)
Glucose, Bld: 84 mg/dL (ref 70–99)
HDL: 39 mg/dL — ABNORMAL LOW (ref >39)
LDL Cholesterol: 109 mg/dL — ABNORMAL HIGH (ref 0–99)
Potassium: 4.9 meq/L (ref 3.5–5.3)
Sodium: 138 meq/L (ref 135–145)
Total Bilirubin: 0.6 mg/dL (ref 0.3–1.2)
Total CHOL/HDL Ratio: 4.4
VLDL: 23 mg/dL (ref 0–40)

## 2009-04-05 ENCOUNTER — Ambulatory Visit: Payer: Self-pay | Admitting: Family Medicine

## 2009-04-05 DIAGNOSIS — M24549 Contracture, unspecified hand: Secondary | ICD-10-CM

## 2009-04-11 ENCOUNTER — Telehealth: Payer: Self-pay | Admitting: Family Medicine

## 2009-04-28 ENCOUNTER — Ambulatory Visit: Payer: Self-pay | Admitting: Family Medicine

## 2009-04-28 DIAGNOSIS — F1021 Alcohol dependence, in remission: Secondary | ICD-10-CM

## 2009-04-28 DIAGNOSIS — L089 Local infection of the skin and subcutaneous tissue, unspecified: Secondary | ICD-10-CM | POA: Insufficient documentation

## 2009-04-29 ENCOUNTER — Ambulatory Visit (HOSPITAL_COMMUNITY): Payer: Self-pay | Admitting: Psychiatry

## 2009-05-12 ENCOUNTER — Ambulatory Visit (HOSPITAL_COMMUNITY): Payer: Self-pay | Admitting: Psychiatry
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
## 2009-05-20 ENCOUNTER — Telehealth: Payer: Self-pay | Admitting: Physician Assistant

## 2009-05-24 ENCOUNTER — Telehealth: Payer: Self-pay | Admitting: Family Medicine

## 2009-05-25 IMAGING — US US RENAL
1 series · 14 of 25 positions shown · non-contrast
Comparison: 03/10/2004

CLINICAL DATA: Renal failure

RENAL/URINARY TRACT ULTRASOUND
TECHNIQUE: Complete ultrasound examination of the urinary tract
was performed including evaluation of the kidneys renal collecting
systems and urinary bladder.

[Series 1: unknown · 0.45mm/px · 14 of 29 slices shown]
[im 1/29]
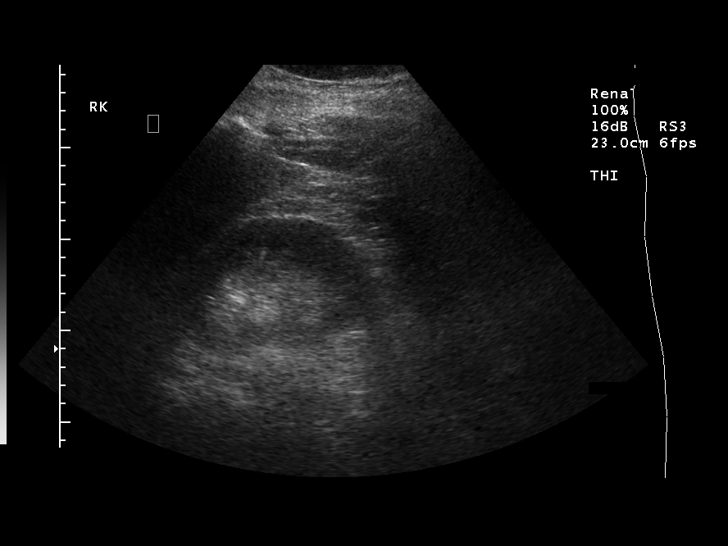
[im 3/29]
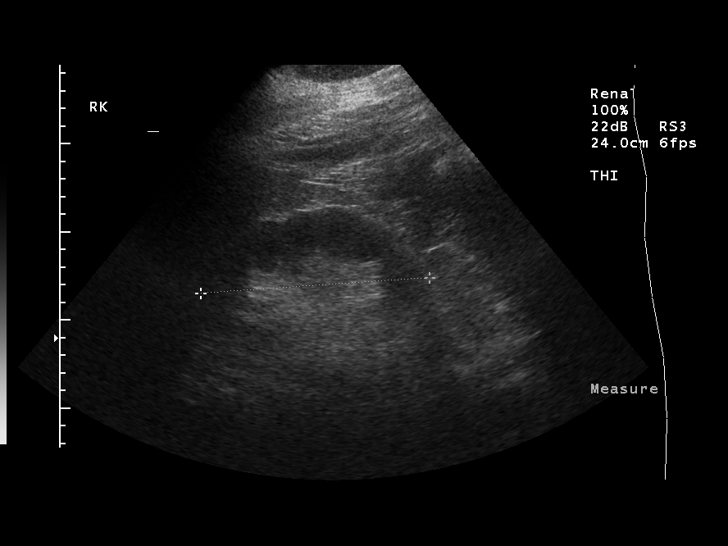
[im 5/29]
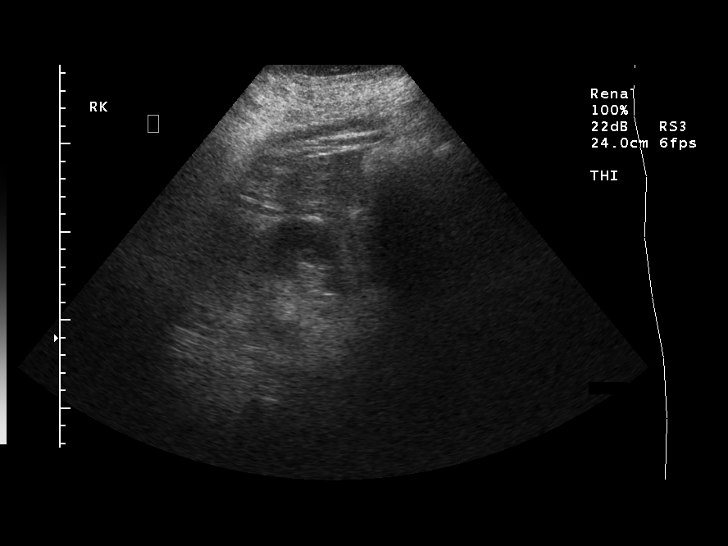
[im 8/29]
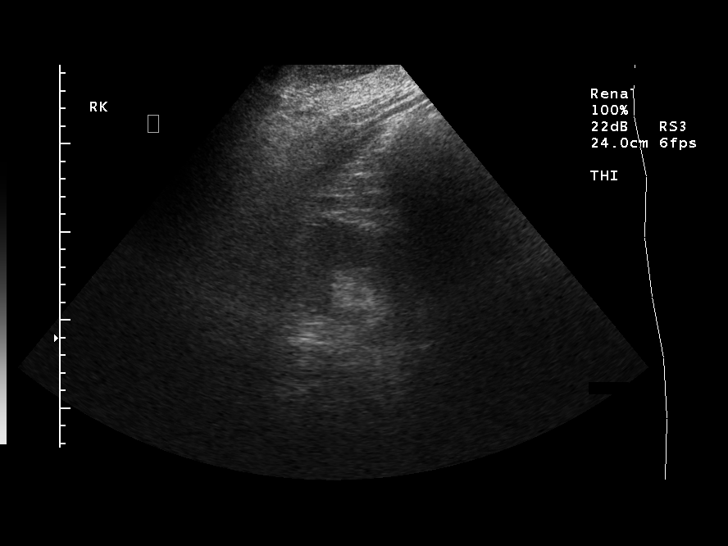
[im 10/29]
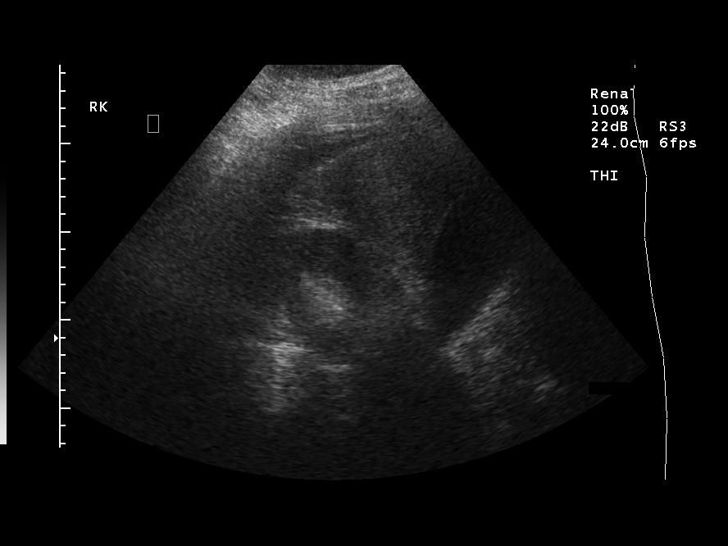
[im 11/29]
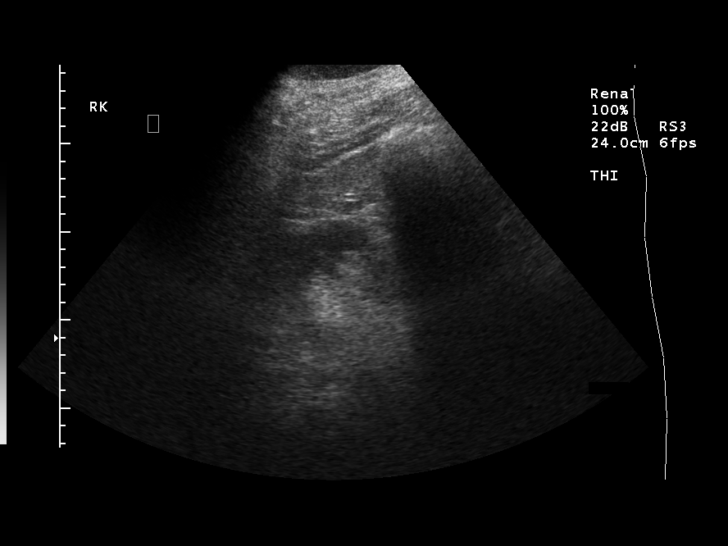
[im 13/29]
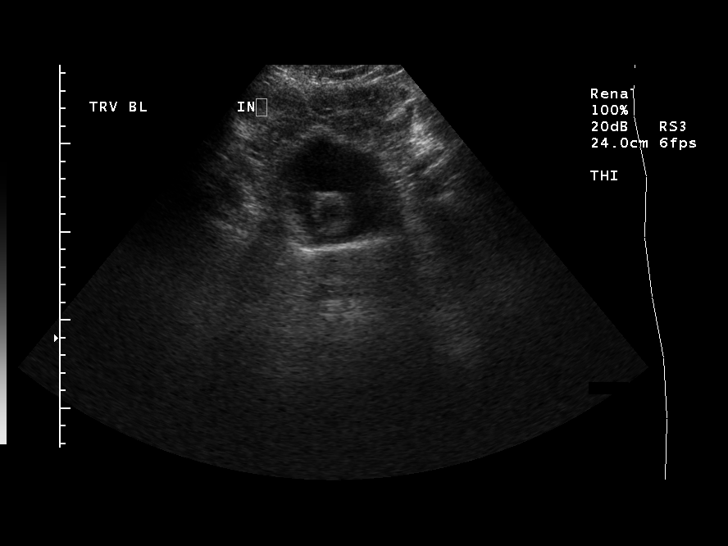
[im 16/29]
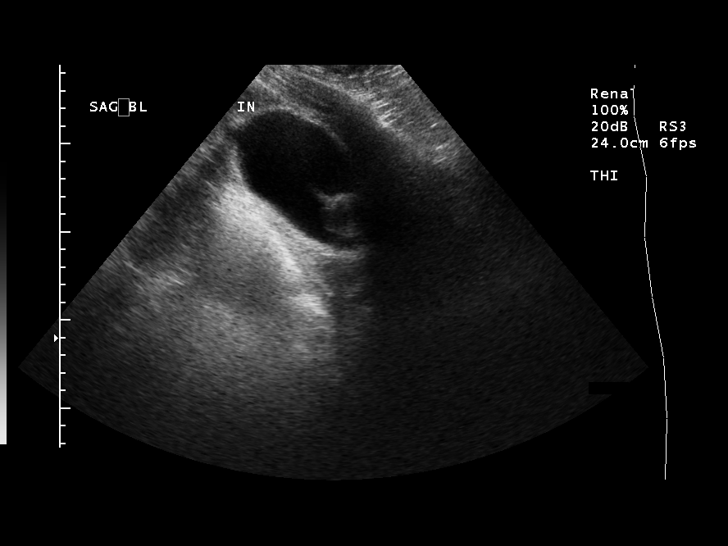
[im 18/29]
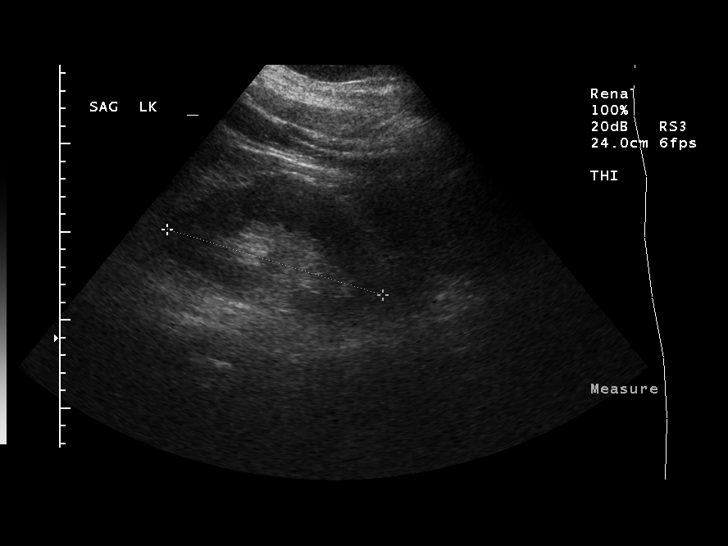
[im 19/29]
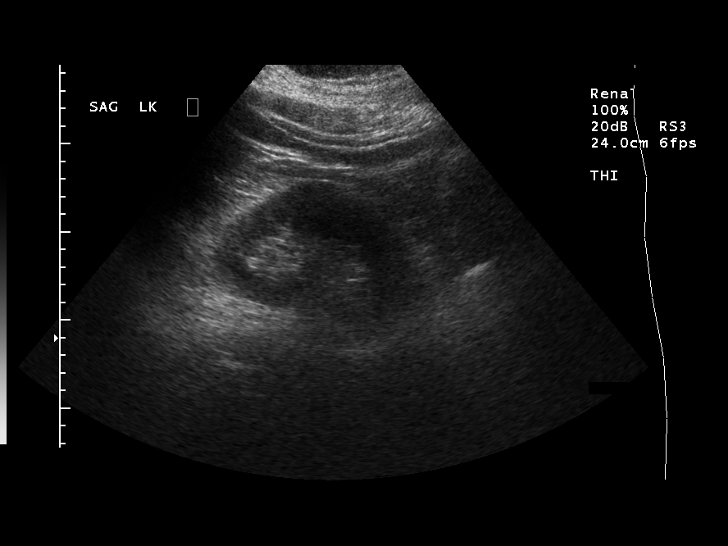
[im 22/29]
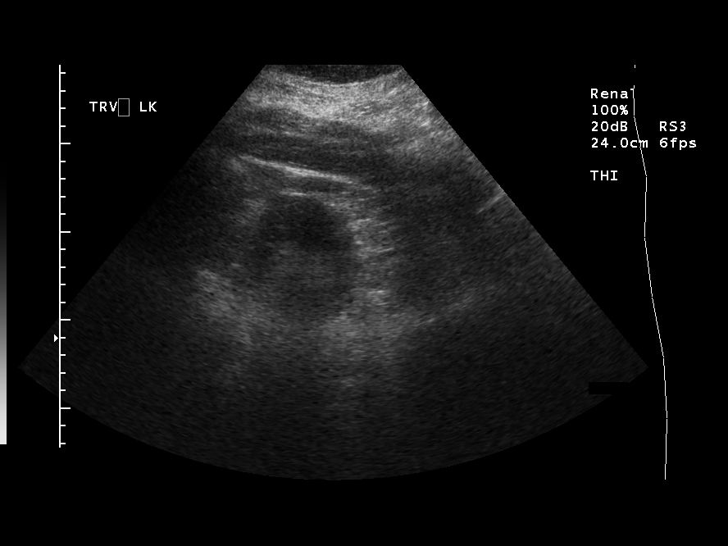
[im 24/29]
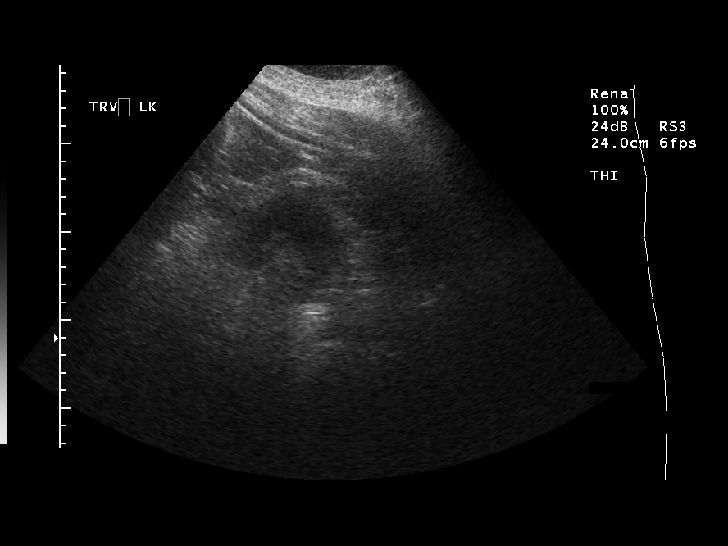
[im 26/29]
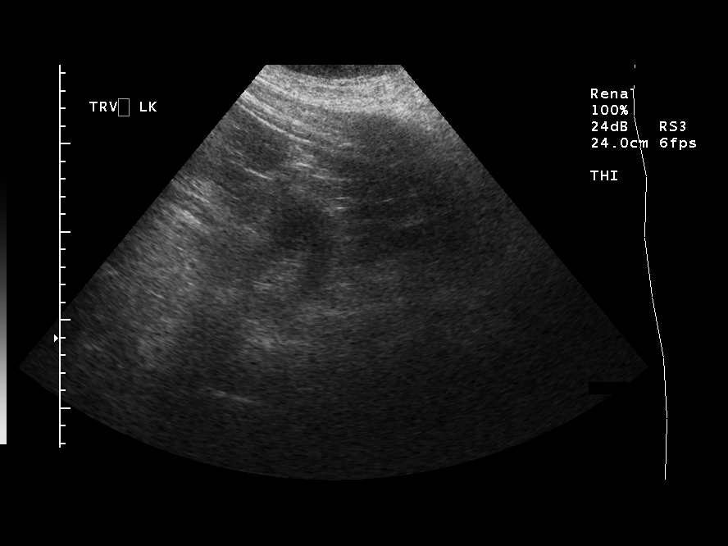
[im 29/29]
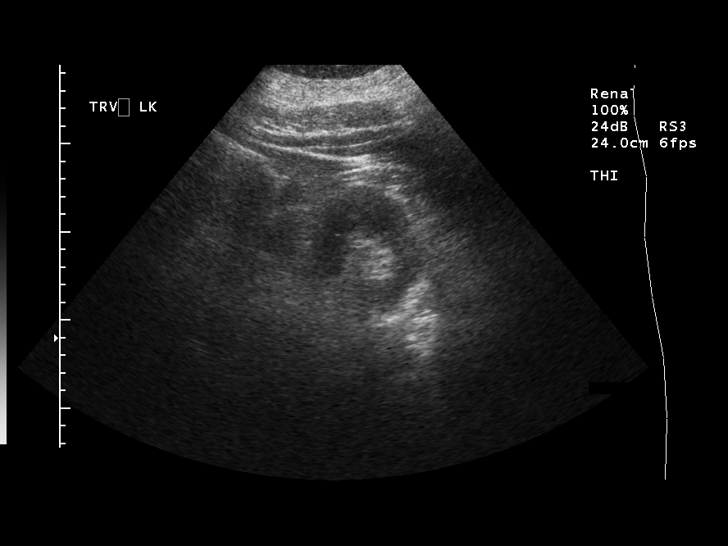

[14 of 25 positions shown; findings below may reference images not displayed]

FINDINGS: The right kidney measures least 13 cm in length, left
12.8 cm.  No hydronephrosis or focal renal lesion.  Urinary bladder
partially decompressed by Foley catheter.
IMPRESSION: 1.  Negative

## 2009-06-02 ENCOUNTER — Telehealth: Payer: Self-pay | Admitting: Family Medicine

## 2009-06-03 ENCOUNTER — Encounter: Payer: Self-pay | Admitting: Family Medicine

## 2009-06-06 ENCOUNTER — Ambulatory Visit (HOSPITAL_COMMUNITY): Payer: Self-pay | Admitting: Psychiatry

## 2009-06-10 ENCOUNTER — Telehealth: Payer: Self-pay | Admitting: Family Medicine

## 2009-06-28 ENCOUNTER — Ambulatory Visit (HOSPITAL_COMMUNITY): Payer: Self-pay | Admitting: Psychiatry

## 2009-06-28 ENCOUNTER — Encounter: Payer: Self-pay | Admitting: Family Medicine

## 2009-07-03 ENCOUNTER — Emergency Department (HOSPITAL_COMMUNITY): Admission: EM | Admit: 2009-07-03 | Discharge: 2009-07-03 | Payer: Self-pay | Admitting: Emergency Medicine

## 2009-07-03 ENCOUNTER — Encounter: Payer: Self-pay | Admitting: Family Medicine

## 2009-07-05 ENCOUNTER — Ambulatory Visit: Payer: Self-pay | Admitting: Family Medicine

## 2009-07-05 DIAGNOSIS — N10 Acute tubulo-interstitial nephritis: Secondary | ICD-10-CM | POA: Insufficient documentation

## 2009-07-05 LAB — CONVERTED CEMR LAB
Glucose, Urine, Semiquant: NEGATIVE
Specific Gravity, Urine: 1.025

## 2009-07-06 LAB — CONVERTED CEMR LAB
ALT: 34 units/L (ref 0–53)
AST: 15 units/L (ref 0–37)
BUN: 19 mg/dL (ref 6–23)
Bilirubin, Direct: 0.1 mg/dL (ref 0.0–0.3)
Calcium: 8.4 mg/dL (ref 8.4–10.5)
Cholesterol: 129 mg/dL (ref 0–200)
Glucose, Bld: 110 mg/dL — ABNORMAL HIGH (ref 70–99)
Indirect Bilirubin: 0.3 mg/dL (ref 0.0–0.9)
Potassium: 4.7 meq/L (ref 3.5–5.3)
Sodium: 136 meq/L (ref 135–145)
Total CHOL/HDL Ratio: 11.7
Total Protein: 6.6 g/dL (ref 6.0–8.3)
Triglycerides: 394 mg/dL — ABNORMAL HIGH (ref <150)
VLDL: 79 mg/dL — ABNORMAL HIGH (ref 0–40)

## 2009-07-07 ENCOUNTER — Encounter: Payer: Self-pay | Admitting: Family Medicine

## 2009-07-11 ENCOUNTER — Ambulatory Visit (HOSPITAL_COMMUNITY): Payer: Self-pay | Admitting: Psychiatry

## 2009-07-12 ENCOUNTER — Telehealth: Payer: Self-pay | Admitting: Physician Assistant

## 2009-07-13 ENCOUNTER — Encounter: Payer: Self-pay | Admitting: Family Medicine

## 2009-08-01 ENCOUNTER — Encounter: Payer: Self-pay | Admitting: Family Medicine

## 2009-08-02 ENCOUNTER — Ambulatory Visit (HOSPITAL_COMMUNITY): Payer: Self-pay | Admitting: Psychiatry

## 2009-08-02 ENCOUNTER — Telehealth: Payer: Self-pay | Admitting: Family Medicine

## 2009-08-09 ENCOUNTER — Ambulatory Visit: Payer: Self-pay | Admitting: Family Medicine

## 2009-08-09 LAB — CONVERTED CEMR LAB
Alkaline Phosphatase: 86 units/L (ref 39–117)
BUN: 19 mg/dL (ref 6–23)
Bilirubin Urine: NEGATIVE
CO2: 23 meq/L (ref 19–32)
Creatinine, Ser: 1.22 mg/dL (ref 0.40–1.50)
Eosinophils Absolute: 1 10*3/uL — ABNORMAL HIGH (ref 0.0–0.7)
Eosinophils Relative: 10 % — ABNORMAL HIGH (ref 0–5)
Glucose, Bld: 85 mg/dL (ref 70–99)
HCT: 42.2 % (ref 39.0–52.0)
Hemoglobin: 13.8 g/dL (ref 13.0–17.0)
Lymphs Abs: 2.9 10*3/uL (ref 0.7–4.0)
MCHC: 32.7 g/dL (ref 30.0–36.0)
MCV: 95 fL (ref 78.0–100.0)
Monocytes Absolute: 0.8 10*3/uL (ref 0.1–1.0)
Monocytes Relative: 8 % (ref 3–12)
Neutrophils Relative %: 50 % (ref 43–77)
RBC: 4.44 M/uL (ref 4.22–5.81)
Total Bilirubin: 0.5 mg/dL (ref 0.3–1.2)
Urobilinogen, UA: 0.2
WBC: 9.3 10*3/uL (ref 4.0–10.5)

## 2009-08-10 ENCOUNTER — Encounter: Payer: Self-pay | Admitting: Physician Assistant

## 2009-08-11 ENCOUNTER — Ambulatory Visit (HOSPITAL_COMMUNITY): Payer: Self-pay | Admitting: Psychiatry

## 2009-08-31 ENCOUNTER — Ambulatory Visit (HOSPITAL_COMMUNITY): Payer: Self-pay | Admitting: Psychiatry

## 2009-09-01 ENCOUNTER — Encounter: Payer: Self-pay | Admitting: Family Medicine

## 2009-09-29 ENCOUNTER — Encounter: Payer: Self-pay | Admitting: Family Medicine

## 2009-09-29 ENCOUNTER — Ambulatory Visit (HOSPITAL_COMMUNITY): Payer: Self-pay | Admitting: Psychiatry

## 2009-10-14 ENCOUNTER — Encounter: Payer: Self-pay | Admitting: Family Medicine

## 2009-10-20 ENCOUNTER — Ambulatory Visit (HOSPITAL_COMMUNITY): Payer: Self-pay | Admitting: Psychiatry

## 2009-10-21 ENCOUNTER — Encounter: Payer: Self-pay | Admitting: Family Medicine

## 2009-10-26 ENCOUNTER — Encounter: Payer: Self-pay | Admitting: Family Medicine

## 2009-11-10 ENCOUNTER — Ambulatory Visit: Payer: Self-pay | Admitting: Family Medicine

## 2009-11-10 ENCOUNTER — Ambulatory Visit (HOSPITAL_COMMUNITY): Payer: Self-pay | Admitting: Psychiatry

## 2009-11-10 DIAGNOSIS — N23 Unspecified renal colic: Secondary | ICD-10-CM

## 2009-11-10 DIAGNOSIS — K625 Hemorrhage of anus and rectum: Secondary | ICD-10-CM | POA: Insufficient documentation

## 2009-11-16 ENCOUNTER — Ambulatory Visit (HOSPITAL_COMMUNITY): Payer: Self-pay | Admitting: Psychiatry

## 2009-11-18 ENCOUNTER — Telehealth: Payer: Self-pay | Admitting: Family Medicine

## 2009-11-28 ENCOUNTER — Encounter: Payer: Self-pay | Admitting: Family Medicine

## 2009-12-01 ENCOUNTER — Ambulatory Visit (HOSPITAL_COMMUNITY): Payer: Self-pay | Admitting: Psychiatry

## 2009-12-07 ENCOUNTER — Telehealth: Payer: Self-pay | Admitting: Family Medicine

## 2009-12-08 ENCOUNTER — Ambulatory Visit (HOSPITAL_COMMUNITY): Payer: Self-pay | Admitting: Psychiatry

## 2009-12-12 ENCOUNTER — Encounter (INDEPENDENT_AMBULATORY_CARE_PROVIDER_SITE_OTHER): Payer: Self-pay | Admitting: *Deleted

## 2009-12-22 ENCOUNTER — Ambulatory Visit (HOSPITAL_COMMUNITY): Payer: Self-pay | Admitting: Psychiatry

## 2009-12-28 ENCOUNTER — Encounter: Payer: Self-pay | Admitting: Family Medicine

## 2010-01-09 ENCOUNTER — Encounter: Payer: Self-pay | Admitting: Family Medicine

## 2010-01-10 ENCOUNTER — Ambulatory Visit (HOSPITAL_COMMUNITY): Payer: Self-pay | Admitting: Psychiatry

## 2010-01-26 ENCOUNTER — Ambulatory Visit (HOSPITAL_COMMUNITY): Payer: Self-pay | Admitting: Psychiatry

## 2010-01-26 ENCOUNTER — Encounter: Payer: Self-pay | Admitting: Family Medicine

## 2010-01-31 LAB — CONVERTED CEMR LAB
Albumin: 4.4 g/dL (ref 3.5–5.2)
Alkaline Phosphatase: 72 units/L (ref 39–117)
Bilirubin, Direct: <0.1 mg/dL (ref 0.0–0.3)
CO2: 27 meq/L (ref 19–32)
Chloride: 104 meq/L (ref 96–112)
Creatinine, Ser: 1.44 mg/dL (ref 0.40–1.50)
Glucose, Bld: 90 mg/dL (ref 70–99)
HDL: 40 mg/dL (ref >39)
LDL Cholesterol: 184 mg/dL — ABNORMAL HIGH (ref 0–99)
PSA: 0.46 ng/mL (ref <=4.00)
Total Bilirubin: 0.3 mg/dL (ref 0.3–1.2)

## 2010-02-07 ENCOUNTER — Ambulatory Visit (HOSPITAL_COMMUNITY): Payer: Self-pay | Admitting: Psychiatry

## 2010-02-09 ENCOUNTER — Ambulatory Visit: Payer: Self-pay | Admitting: Family Medicine

## 2010-02-10 LAB — CONVERTED CEMR LAB: TSH: 2.884 microintl units/mL (ref 0.350–4.500)

## 2010-02-13 ENCOUNTER — Telehealth: Payer: Self-pay | Admitting: Family Medicine

## 2010-02-16 ENCOUNTER — Ambulatory Visit (HOSPITAL_COMMUNITY): Payer: Self-pay | Admitting: Psychiatry

## 2010-02-28 ENCOUNTER — Encounter: Payer: Self-pay | Admitting: Family Medicine

## 2010-03-06 ENCOUNTER — Ambulatory Visit (HOSPITAL_COMMUNITY)
Admission: RE | Admit: 2010-03-06 | Discharge: 2010-03-06 | Payer: Self-pay | Source: Home / Self Care | Attending: Psychiatry | Admitting: Psychiatry

## 2010-03-13 ENCOUNTER — Telehealth: Payer: Self-pay | Admitting: Family Medicine

## 2010-03-19 ENCOUNTER — Encounter: Payer: Self-pay | Admitting: Family Medicine

## 2010-03-19 ENCOUNTER — Encounter: Payer: Self-pay | Admitting: *Deleted

## 2010-03-20 ENCOUNTER — Telehealth: Payer: Self-pay | Admitting: Family Medicine

## 2010-03-20 ENCOUNTER — Ambulatory Visit (HOSPITAL_COMMUNITY): Admit: 2010-03-20 | Payer: Self-pay | Admitting: Psychiatry

## 2010-03-23 ENCOUNTER — Telehealth: Payer: Self-pay | Admitting: Family Medicine

## 2010-03-23 ENCOUNTER — Ambulatory Visit (HOSPITAL_COMMUNITY): Admit: 2010-03-23 | Payer: Self-pay | Admitting: Psychiatry

## 2010-03-30 ENCOUNTER — Telehealth (INDEPENDENT_AMBULATORY_CARE_PROVIDER_SITE_OTHER): Payer: Self-pay | Admitting: *Deleted

## 2010-03-30 NOTE — Progress Notes (Signed)
  Phone Note From Pharmacy   Caller: aarp Summary of Call: ventolin hfa not covered Initial call taken by: Adella Hare LPN,  May 24, 2009 11:58 AM  Follow-up for Phone Call        try proventil same directions, or rarther call pharmacy and find out what is, the directions for use is the same as long as it is albuterol, questions, pls ask  Follow-up by: Syliva Overman MD,  May 24, 2009 8:52 PM  Additional Follow-up for Phone Call Additional follow up Details #1::        sent proventil in the place of pro air Additional Follow-up by: Adella Hare LPN,  May 26, 2009 3:44 PM    New/Updated Medications: PROVENTIL HFA 108 (90 BASE) MCG/ACT AERS (ALBUTEROL SULFATE) two puffs every six hours  discontinue pro air PROVENTIL HFA 108 (90 BASE) MCG/ACT AERS (ALBUTEROL SULFATE) two puffs every six hours as needed discontinue pro air Prescriptions: PROVENTIL HFA 108 (90 BASE) MCG/ACT AERS (ALBUTEROL SULFATE) two puffs every six hours as needed discontinue pro air  #1 x 2   Entered by:   Adella Hare LPN   Authorized by:   Syliva Overman MD   Signed by:   Adella Hare LPN on 16/11/9602   Method used:   Electronically to        [facility] (retail)       [address]       [place], [place]  [address]       Ph: [phone]       Fax: [phone]   RxID:   [id number] PROVENTIL HFA 108 (90 BASE) MCG/ACT AERS (ALBUTEROL SULFATE) two puffs every six hours  discontinue pro air  #1 x 2   Entered by:   Adella Hare LPN   Authorized by:   Syliva Overman MD   Signed by:   Adella Hare LPN on 12/23/2534   Method used:   Electronically to        [facility] (retail)       [address]       [place], [place]  [address]       Ph: [phone]       Fax: [phone]   RxID:   [id number]

## 2010-03-30 NOTE — Progress Notes (Signed)
Summary: erectile disfuctional  Phone Note Call from Patient   Summary of Call: pt needs to speak with nurse about having some erectile disfuction and would like to get a rx [phone]. Initial call taken by: Rudene Anda,  November 18, 2009 1:58 PM  Follow-up for Phone Call        pls let pt know viagra has beensent to his pharmacy, it is very expensive and may not  be  covered , then i suggest he justbuy 1 pill at a time Follow-up by: Syliva Overman MD,  November 18, 2009 2:30 PM  Additional Follow-up for Phone Call Additional follow up Details #1::        Phone Call Completed Additional Follow-up by: Adella Hare LPN,  November 18, 2009 2:45 PM    New/Updated Medications: VIAGRA 50 MG TABS (SILDENAFIL CITRATE) one tablet 30 minutes bwefore intercourse as needed Prescriptions: VIAGRA 50 MG TABS (SILDENAFIL CITRATE) one tablet 30 minutes bwefore intercourse as needed  #8 x 1   Entered and Authorized by:   Syliva Overman MD   Signed by:   Syliva Overman MD on 11/18/2009   Method used:   Electronically to        [facility] (retail)       [address]       [place], [place]  [address]       Ph: [phone]       Fax: [phone]   RxID:   [id number]

## 2010-03-30 NOTE — Assessment & Plan Note (Signed)
Summary: office visit   Vital Signs:  Patient profile:   45 year old male Height:      73 inches Weight:      296.25 pounds O2 Sat:      99 % on Room air Pulse rate:   101 / minute Pulse rhythm:   regular Resp:     16 per minute BP sitting:   120 / 80  (left arm)  Vitals Entered By: Worthy Keeler LPN (April 05, 2009 12:58 PM)  O2 Flow:  Room air CC: follow-up visit Is Patient Diabetic? No Pain Assessment Patient in pain? no        Primary Care Provider:  Syliva Overman MD  CC:  follow-up visit.  History of Present Illness: Pt reports that he is experiencing inc pain and is requesting referral to pT dfor help with the severe contracture of his hand. He denies ny recent fever or chills. Though he is depressed, he states that this is controlled and he continues to follow closely with mentl health. He c/o inability to clean between his fingers and wants  cleanser.  Current Medications (verified): 1)  Nexium 40 Mg Cpdr (Esomeprazole Magnesium) .... One Daily 2)  Xanax 1 Mg Tabs (Alprazolam) .... Three Times A Day 3)  Propranolol Hcl 20 Mg  Tabs (Propranolol Hcl) .... One Four Times A Day For Mood Swings and Headache Per Psychiatry 4)  Trazodone Hcl 150 Mg Tabs (Trazodone Hcl) .... 2 Tabs At Bedtime 5)  Voltaren 75 Mg  Tbec (Diclofenac Sodium) .... Two Times A Day  - Dr. Hilda Lias 6)  Maxalt-Mlt 10 Mg  Tbdp (Rizatriptan Benzoate) .... Dissolve One Under Tongue With Severe Headaches. May Repeat Times One in 2 Hours - Do Not Exceed Two Pills in 24 Hours. 7)  Benztropine Mesylate 0.5 Mg  Tabs (Benztropine Mesylate) .... One At Bedtime - Dr. Lolly Mustache 8)  Haloperidol 10 Mg  Tabs (Haloperidol) .... Two At Bedtime Per Dr. Lolly Mustache 9)  Proair Hfa 108 [phone] Base) Mcg/act  Aers (Albuterol Sulfate) .... 2 Puffs Every 6 Hours As Needed 10)  Lotrel 10-40 Mg  Caps (Amlodipine Besy-Benazepril Hcl) .... One Daily 11)  Zantac 150 Mg Tabs (Ranitidine Hcl) .... One Two Times A Day 12)  Flexeril 10  Mg Tabs (Cyclobenzaprine Hcl) .... One Three Times A Day As Needed 13)  Niaspan 500 Mg Cr-Tabs (Niacin (Antihyperlipidemic)) .... One Daily At Bedtime 14)  Duoneb 0.5-2.5 (3) Mg/44ml Soln (Ipratropium-Albuterol) .... Three Times A Day 15)  Oxycodone-Acetaminophen 10-325 Mg Tabs (Oxycodone-Acetaminophen) .... One Tab By Mouth Qid 16)  Ventolin Hfa 108 (90 Base) Mcg/act Aers (Albuterol Sulfate) .... Two Puffs Every 6 Hours As Needed 17)  Lipitor 40 Mg Tabs (Atorvastatin Calcium) .... Take 1 Tab By Mouth At Bedtime 18)  Neurontin 400 Mg Caps (Gabapentin) .... One Capsule Twice Daily and Three At Bedtime 19)  Antabuse 500 Mg Tabs (Disulfiram) .... One Half Tab By Mouth Qd  Allergies (verified): 1)  ! Sulfa  Review of Systems General:  Complains of fatigue; denies chills and fever. Eyes:  Denies discharge and red eye. ENT:  Denies hoarseness, nasal congestion, and sinus pressure. CV:  Denies chest pain or discomfort, palpitations, and swelling of feet. Resp:  Complains of cough, shortness of breath, and wheezing; denies sputum productive; no plan to quit nicotine. GI:  Denies abdominal pain, constipation, diarrhea, nausea, and vomiting. GU:  Denies dysuria and urinary frequency. MS:  Complains of joint pain, low back pain, mid back  pain, muscle aches, muscle weakness, and stiffness; c/o contracture of hand severe enough to limit function requests PT. Psych:  Complains of anxiety, depression, mental problems, and panic attacks; denies suicidal thoughts/plans, thoughts of violence, and unusual visions or sounds; fdollowed by mentl health. Endo:  Denies cold intolerance, excessive hunger, excessive thirst, excessive urination, heat intolerance, polyuria, and weight change. Heme:  Denies abnormal bruising and bleeding. Allergy:  Denies hives or rash and itching eyes.  Physical Exam  General:  Well-developed,morbidly obese in no acute distress; alert,appropriate and cooperative throughout  examination HEENT: No facial asymmetry,  EOMI, No sinus tenderness, TM's Clear, oropharynx  pink and moist.   Chest: Clear to auscultation bilaterally. Decreased air entry throughout CVS: S1, S2, No murmurs, No S3.   Abd: Soft, Nontender.  MS: decreased ROM spine, adequate in hips, shoulders and knees. Decreased left upper ext mobility.contracture of hnd Ext: No edema.   CNS: CN 2-12 intact, power tone and sensation normal throughout.   Skin: Intact, no visible lesions or rashes.  Psych: Good eye contact, bluntedl affect.  Memory intact, not anxious or depressed appearing.    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 2 puffs every 6 hours as needed  discontinue ventolin    Duoneb 0.5-2.5 (3) Mg/15ml Soln (Ipratropium-albuterol) .Marland Kitchen... Three times a day  Problem # 4:  OBESITY NOS (ICD-278.00) Assessment: Unchanged  Ht: 73 (04/05/2009)   Wt: 296.25 (04/05/2009)   BMI: 39.16 (02/02/2009)  Problem # 5:  HYPERTENSION (ICD-401.9) Assessment: Improved  His updated medication list for this problem includes:    Propranolol Hcl 20 Mg Tabs (Propranolol hcl) ..... One four times a day for mood swings and headache per psychiatry    Lotrel 10-40 Mg Caps (Amlodipine besy-benazepril hcl) ..... One daily  BP today: 120/80 Prior BP: 130/86 (02/02/2009)  Prior 10 Yr Risk Heart Disease: 7 % (11/11/2008)  Labs Reviewed: K+: 4.9 (04/01/2009) Creat: : 1.21 (04/01/2009)   Chol: 171  (04/01/2009)   HDL: 39 (04/01/2009)   LDL: 109 (04/01/2009)   TG: 116 (04/01/2009)  Problem # 6:  TOBACCO ABUSE (ICD-305.1) Assessment: Unchanged  Encouraged smoking cessation and discussed different methods for smoking cessation.   Complete Medication List: 1)  Nexium 40 Mg Cpdr (Esomeprazole magnesium) .... One daily 2)  Xanax 1 Mg Tabs (Alprazolam) .... Three times a day 3)  Propranolol Hcl 20 Mg Tabs (Propranolol hcl) .... One four times a day for mood swings and headache per psychiatry 4)  Trazodone Hcl 150 Mg Tabs (Trazodone hcl) .... 2 tabs at bedtime 5)  Voltaren 75 Mg Tbec (Diclofenac sodium) .... Two times a day  - dr. Hilda Lias 6)  Maxalt-mlt 10 Mg Tbdp (Rizatriptan benzoate) .... Dissolve one under tongue with severe headaches. may repeat times one in 2 hours - do not exceed two pills in 24 hours. 7)  Benztropine Mesylate 0.5 Mg Tabs (Benztropine mesylate) .... One at bedtime - dr. Lolly Mustache 8)  Haloperidol 10 Mg Tabs (Haloperidol) .... Two at bedtime per  dr. Lolly Mustache 9)  Proair Hfa 108 [phone] Base) Mcg/act Aers (Albuterol sulfate) .... 2 puffs every 6 hours as needed  discontinue ventolin 10)  Lotrel 10-40 Mg Caps (Amlodipine besy-benazepril hcl) .... One daily 11)  Zantac 150 Mg Tabs (Ranitidine hcl) .... One two times a day 12)  Flexeril 10 Mg Tabs (Cyclobenzaprine hcl) .... One three times a day as needed 13)  Niaspan 500 Mg Cr-tabs (Niacin (antihyperlipidemic)) .... One daily at bedtime 14)  Duoneb 0.5-2.5 (3) Mg/85ml Soln (Ipratropium-albuterol) .... Three times a day 15)  Oxycodone-acetaminophen 10-325 Mg Tabs (Oxycodone-acetaminophen) .... One tab by mouth qid 16)  Lipitor 40 Mg Tabs (Atorvastatin calcium) .... Take 1 tab by mouth at bedtime 17)  Neurontin 400 Mg Caps (Gabapentin) .... One capsule twice daily and three at bedtime 18)  Antabuse 500 Mg Tabs (Disulfiram) .... One half tab by mouth qd 19)  Lidoderm 5 % Ptch (Lidocaine) .... Apply daily for 12 hours 20)  Niaspan 500 Mg  Cr-tabs (Niacin (antihyperlipidemic)) .... 2 tablets daily 21)  Hibiclens 4 % Liqd (Chlorhexidine gluconate) .... Use to wash left hand 3 times daily  Other Orders: T-Hepatic Function [phone]) T-Lipid Profile [phone])  Patient Instructions: 1)  Please schedule a follow-up appointment in 3 months. 2)  You need to lose weight. Consider a lower calorie diet and regular exercise.  3)  Hepatic Panel prior to visit, ICD-9: 4)  Lipid Panel prior to visit, ICD-9:   fasting in 3 months 5)  New dose of niaspan 2 at bedtime. 6)  New meds as discussed Prescriptions: PROAIR HFA 108 (90 BASE) MCG/ACT  AERS (ALBUTEROL SULFATE) 2 puffs every 6 hours as needed  discontinue ventolin  #1 x 2   Entered by:   Adella Hare LPN   Authorized by:   Syliva Overman MD   Signed by:   Adella Hare LPN on 86/57/8469   Method used:   Electronically to        [facility] (retail)       [address]       [place], [place]  [address]       Ph: [phone]       Fax: [phone]   RxID:   [id number] OXYCODONE-ACETAMINOPHEN 10-325 MG TABS (OXYCODONE-ACETAMINOPHEN) one tab by mouth qid  #120 x 0   Entered and Authorized by:   Syliva Overman MD   Signed by:   Adella Hare LPN on 32/95/1884   Method used:   Handwritten   RxID:   [id number] NIASPAN 500 MG CR-TABS (NIACIN (ANTIHYPERLIPIDEMIC)) One daily at bedtime  #30 x 5   Entered by:   Adella Hare LPN   Authorized by:   Syliva Overman MD   Signed by:   Adella Hare LPN on 55/73/2202   Method used:   Electronically to        [facility] (retail)       [address]       [place], [place]  [address]       Ph: [phone]       Fax: [phone]   RxID:   [id number] LOTREL 10-40 MG  CAPS (AMLODIPINE BESY-BENAZEPRIL HCL) One daily  #30 x 5   Entered by:   Adella Hare LPN   Authorized by:   Syliva Overman MD   Signed by:   Adella Hare LPN on 09/38/1829   Method used:  Electronically to         [facility] (retail)       [address]       [place], [place]  [address]       Ph: [phone]       Fax: [phone]   RxID:   [id number] HIBICLENS 4 % LIQD (CHLORHEXIDINE GLUCONATE) use to wash left hand 3 times daily  #8 ounces x 3   Entered and Authorized by:   Syliva Overman MD   Signed by:   Syliva Overman MD on 04/05/2009   Method used:   Electronically to        [facility] (retail)       [address]       [place], [place]  [address]       Ph: [phone]       Fax: [phone]   RxID:   [id number] NIASPAN 500 MG CR-TABS (NIACIN (ANTIHYPERLIPIDEMIC)) 2 tablets daily  #60 x 4   Entered and Authorized by:   Syliva Overman MD   Signed by:   Syliva Overman MD on 04/05/2009   Method used:   Printed then faxed to ...       [facility]* (retail)       [address]       [place], [place]  [address]       Ph: [phone]       Fax: [phone]   RxID:   [id number] LIDODERM 5 % PTCH (LIDOCAINE) apply daily for 12 hours  #90 x 4   Entered and Authorized by:   Syliva Overman MD   Signed by:   Syliva Overman MD on 04/05/2009   Method used:   Electronically to        [facility] (retail)       [address]       [place], [place]  [address]       Ph: [phone]       Fax: [phone]   RxID:   [id number]

## 2010-03-30 NOTE — Miscellaneous (Signed)
Summary: NARC REFILL  Clinical Lists Changes  Medications: Rx of OXYCODONE-ACETAMINOPHEN 10-325 MG TABS (OXYCODONE-ACETAMINOPHEN) one tab by mouth qid;  #120 x 0;  Signed;  Entered by: Everitt Amber LPN;  Authorized by: Syliva Overman MD;  Method used: Handwritten    Prescriptions: OXYCODONE-ACETAMINOPHEN 10-325 MG TABS (OXYCODONE-ACETAMINOPHEN) one tab by mouth qid  #120 x 0   Entered by:   Everitt Amber LPN   Authorized by:   Syliva Overman MD   Signed by:   Everitt Amber LPN on 16/11/9602   Method used:   Handwritten   RxID:   [id number]

## 2010-03-30 NOTE — Miscellaneous (Signed)
Summary: NARCREFILL  Clinical Lists Changes  Medications: Rx of OXYCODONE-ACETAMINOPHEN 10-325 MG TABS (OXYCODONE-ACETAMINOPHEN) one tab by mouth qid;  #120 x 0;  Signed;  Entered by: Everitt Amber LPN;  Authorized by: Syliva Overman MD;  Method used: Handwritten    Prescriptions: OXYCODONE-ACETAMINOPHEN 10-325 MG TABS (OXYCODONE-ACETAMINOPHEN) one tab by mouth qid  #120 x 0   Entered by:   Everitt Amber LPN   Authorized by:   Syliva Overman MD   Signed by:   Everitt Amber LPN on 16/11/9602   Method used:   Handwritten   RxID:   [id number]
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
## 2010-03-30 NOTE — Letter (Signed)
Summary: Unable to Reach, Consult Scheduled  [facility]  [address]   [place], [place] [address]   Phone: [phone]  Fax: [phone]    12/12/2009  Gavin Gray [address] [place], [place]  [address] [birth year]   Dear Mr. OLEKSY,   We have been unable to reach you by phone.  Please contact our office with an updated phone number.  At the recommendation of DR SIMPSON  we have been asked to schedule you a consult with DR Jena Gauss for RECTAL BLEEDING.  Please call our office at [phone].     Thank you,    Diana Eves  [facility] R. Roetta Sessions, M.D.    Jonette Eva, M.D. Lorenza Burton, FNP-BC    Tana Coast, PA-C Phone: [phone]    Fax: [phone]

## 2010-03-30 NOTE — Miscellaneous (Signed)
Summary: NARC REFILL  Clinical Lists Changes  Medications: Rx of OXYCODONE-ACETAMINOPHEN 10-325 MG TABS (OXYCODONE-ACETAMINOPHEN) one tab by mouth qid;  #120 x 0;  Signed;  Entered by: Everitt Amber LPN;  Authorized by: Syliva Overman MD;  Method used: Handwritten    Prescriptions: OXYCODONE-ACETAMINOPHEN 10-325 MG TABS (OXYCODONE-ACETAMINOPHEN) one tab by mouth qid  #120 x 0   Entered by:   Everitt Amber LPN   Authorized by:   Syliva Overman MD   Signed by:   Everitt Amber LPN on 32/44/0102   Method used:   Handwritten   RxID:   [id number]

## 2010-03-30 NOTE — Assessment & Plan Note (Signed)
Summary: 6 WK F/U   Vital Signs:  Patient Profile:   45 Years Old Male Height:     73 inches (185.42 cm) Weight:      272 pounds BMI:     36.02 O2 Sat:      99 % Temp:     97.9 degrees F Pulse rate:   80 / minute Resp:     14 per minute BP sitting:   123 / 87  Vitals Entered By: Sherilyn Banker (Jul 09, 2006 1:02 PM)               PCP:  Franchot Heidelberg, MD  Chief Complaint:  needs lotrel script.  History of Present Illness: Pt in for recheck.  He notes he is doing well. He had hyperkalemia on recent visit and lab was rechecked with level 4.3  He also has a hx of mid back pain. He states pain is 6/10. Worse with motion, lifting and pulling. Better with ice and it seems to help. Note hx of roof trusses falling on him in the late 1990s. Xray done of T-spine and L-spine and all was normal.  He states pain is localized. Does not radiate to legs  - occasionally side discomfort and pain to hips. Flexeril helps a lot. Would not be opposed to PT.      Hypertension History:      He denies headache, chest pain, palpitations, dyspnea with exertion, orthopnea, PND, peripheral edema, visual symptoms, neurologic problems, syncope, and side effects from treatment.  He notes no problems with any antihypertensive medication side effects.  Further comments include: Needs refill on Lotrel.        Positive major cardiovascular risk factors include hyperlipidemia, hypertension, and current tobacco user.  Negative major cardiovascular risk factors include male age less than 26 years old, no history of diabetes, and negative family history for ischemic heart disease.        Further assessment for target organ damage reveals no history of ASHD, stroke/TIA, or peripheral vascular disease.    Lipid Management History:      Positive NCEP/ATP III risk factors include HDL cholesterol less than 40, current tobacco user, and hypertension.  Negative NCEP/ATP III risk factors include male age less than 41  years old, non-diabetic, no family history for ischemic heart disease, no ASHD (atherosclerotic heart disease), no prior stroke/TIA, no peripheral vascular disease, and no history of aortic aneurysm.        The patient states that he knows about the "Therapeutic Lifestyle Change" diet.  His compliance with the TLC diet is fair.  Adjunctive measures started by the patient include aerobic exercise, fiber, and omega-3 supplements.  He expresses no side effects from his lipid-lowering medication.  The patient denies any symptoms to suggest myopathy or liver disease from his "statin" therapy.  Comments: Due for LFTs - not exersizing but eating healthier.    Current Allergies (reviewed today): No known allergies   Past Medical History:    Reviewed history from 04/29/2006 and no changes required:       Anxiety       Depression       Hyperlipidemia       Hypertension  Past Surgical History:    Reviewed history from 04/29/2006 and no changes required:       R knee 2002   Family History:    Reviewed history from 04/29/2006 and no changes required:       father- A&W  mother-DM,HTN,hyperlipidemia       3 sisters- A&W       3 children- A&W  Social History:    Reviewed history from 04/29/2006 and no changes required:       Divorced       Current Smoker       Alcohol use-no       Drug use-no    Review of Systems      See HPI   Physical Exam  General:     Well-developed,well-nourished,in no acute distress; alert,appropriate and cooperative throughout examination Lungs:     Normal respiratory effort, chest expands symmetrically. Lungs are clear to auscultation, no crackles or wheezes. Heart:     Normal rate and regular rhythm. S1 and S2 normal without gallop, murmur, click, rub or other extra sounds. Abdomen:     Bowel sounds positive,abdomen soft and non-tender without masses, organomegaly or hernias noted. Extremities:     No clubbing, cyanosis, edema, or deformity noted  with normal full range of motion of all joints.      Impression & Recommendations:  Problem # 1:  BACK PAIN, CHRONIC (ICD-724.5) Discussed. Xrays do not show acute bony abnormality. Will refer to PT to eval and treat. Will see how he does with this and if sx do not improve, will need MRI. Also, need to consider tapering narcotic and muscle relaxer use as started by previous MD with NSAID substitution. Await response to PT and optomize. His updated medication list for this problem includes:    Vicodin 5-500 Mg Tabs (Hydrocodone-acetaminophen) ..... Qid as needed    Flexeril 10 Mg Tabs (Cyclobenzaprine hcl) ..... One by mouth three times daily   Problem # 2:  HYPERTENSION (ICD-401) At goal. Med refilled. Diet, exersize, weight loss and low salt intake with yearly eye exam encouraged. His updated medication list for this problem includes:    Lotrel 5-10 Mg Caps (Amlodipine besy-benazepril hcl) ..... Once daily    Inderal Tabs (Propranolol hcl tabs) ..... 20mg  qid   Problem # 3:  OBESITY NOS (ICD-278.00) See above.  Problem # 4:  TOBACCO ABUSE (ICD-305.1) Open to quitting. Insurance will be problem to cover medication. Script given for patch. Refer smoking cessation class. Follow closely. His updated medication list for this problem includes:    Nicotine 21 Mg/24hr Pt24 (Nicotine) .Marland Kitchen... As directed   Problem # 5:  HYPERKALEMIA (ICD-276.7) Resolved. Suspect hemolyisis.  Problem # 6:  DEPRESSION (ICD-311) Per Psych. His updated medication list for this problem includes:    Xanax 1 Mg Tabs (Alprazolam) .Marland Kitchen... Three times a day    Trazodone Hcl Tabs (Trazodone hcl tabs) ..... 400mg  at bedtime   Medications Added to Medication List This Visit: 1)  Nicotine 21 Mg/24hr Pt24 (Nicotine) .... As directed  Other Orders: T-Comprehensive Metabolic Panel [phone])  Hypertension Assessment/Plan:      The patient's hypertensive risk group is category B: At least one risk factor  (excluding diabetes) with no target organ damage.  His calculated 10 year risk of coronary heart disease is 11 %.  Today's blood pressure is 123/87.  His blood pressure goal is < 140/90.  Lipid Assessment/Plan:      Based on NCEP/ATP III, the patient's risk factor category is "2 or more risk factors and a calculated 10 year CAD risk of < 20%".  From this information, the patient's calculated lipid goals are as follows: Total cholesterol goal is 200; LDL cholesterol goal is 130; HDL cholesterol goal is 40; Triglyceride goal  is 150.     Patient Instructions: 1)  Please schedule a follow-up appointment in 6 weeks. Prescriptions: NICOTINE 21 MG/24HR PT24 (NICOTINE) As directed  #1 x 0   Entered and Authorized by:   Franchot Heidelberg MD   Signed by:   Franchot Heidelberg MD on 07/09/2006   Method used:   Print then Give to Patient   RxID:   [id number] LOTREL 5-10 MG CAPS (AMLODIPINE BESY-BENAZEPRIL HCL) once daily  #30 x 3   Entered and Authorized by:   Franchot Heidelberg MD   Signed by:   Franchot Heidelberg MD on 07/09/2006   Method used:   Print then Give to Patient   RxID:   [id number]   Appended Document: Orders Update Prescriptions: LOTREL 10-40 MG CAPS (AMLODIPINE BESY-BENAZEPRIL HCL) once daily  #30 x 3   Entered by:   Sherilyn Banker   Authorized by:   Franchot Heidelberg MD   Signed by:   Sherilyn Banker on 07/09/2006   Method used:   Print then Give to Patient   RxID:   [id number]     Clinical Lists Changes  Medications: Changed medication from LOTREL 5-10 MG CAPS (AMLODIPINE BESY-BENAZEPRIL HCL) once daily to LOTREL 10-40 MG CAPS (AMLODIPINE BESY-BENAZEPRIL HCL) once daily - Signed Rx of LOTREL 10-40 MG CAPS (AMLODIPINE BESY-BENAZEPRIL HCL) once daily;  #30 x 3;  Signed;  Entered by: Sherilyn Banker;  Authorized by: Franchot Heidelberg MD;  Method used: Print then Give to Patient  Appended Document: 6 WK F/U order faxed to PT  Appended Document: 6 WK F/U unable to  send to PT at this time, patient has no phone

## 2010-03-30 NOTE — Medication Information (Signed)
Summary: Tax adviser   Imported By: Lind Guest 10/26/2009 09:15:40  _____________________________________________________________________  External Attachment:    Type:   Image     Comment:   External Document

## 2010-03-30 NOTE — Assessment & Plan Note (Signed)
Summary: f up   Vital Signs:  Patient profile:   45 year old male Weight:      326.12 pounds O2 Sat:      97 % on Room air Pulse (ortho):   101 / minute Pulse rhythm:   regular BP sitting:   100 / 80  (left arm)  O2 Flow:  Room air  Primary Care Nahiem Dredge:  Syliva Overman MD   History of Present Illness: Reports  that he has been doing fairly well, except for excessive weight gain and continued tobacco use. Denies recent fever or chills. Denies sinus pressure, nasal congestion , ear pain or sore throat. Denies chest congestion, or cough productive of sputum. Denies chest pain, palpitations, PND, orthopnea or leg swelling. Denies abdominal pain, nausea, vomitting, diarrhea or constipation. Denies change in bowel movements or bloody stool. Denies dysuria , frequency, incontinence or hesitancy. Chronic   joint pain, swelling, and  reduced mobility. Denies headaches, vertigo, seizures. Denies depression, anxiety or insomnia. Denies  rash, lesions, or itch.     Preventive Screening-Counseling & Management  Alcohol-Tobacco     Smoking Cessation Counseling: yes  Allergies: 1)  ! Sulfa 2)  ! Morphine  Review of Systems      See HPI General:  Denies fatigue. Eyes:  Denies blurring, discharge, eye pain, and red eye. MS:  Complains of low back pain and mid back pain; 2 day h/o acute LBP was changing a tyre and developed back pain radiating to rle. Psych:  Complains of anxiety, depression, and mental problems; denies suicidal thoughts/plans, thoughts of violence, and unusual visions or sounds. Endo:  Denies cold intolerance, excessive hunger, excessive thirst, and excessive urination. Heme:  Denies abnormal bruising and bleeding. Allergy:  Denies hives or rash, itching eyes, and persistent infections.  Physical Exam  General:  Well-developed,obese,in no acute distress; alert,appropriate and cooperative throughout examination HEENT: No facial asymmetry,  EOMI, No sinus  tenderness, TM's Clear, oropharynx  pink and moist.   Chest: Clear to auscultation bilaterally.  CVS: S1, S2, No murmurs, No S3.   Abd: Soft, Nontender.  MS: decreased  ROM spine,adequate in  hips, shoulders and knees.  Ext: No edema.   CNS: CN 2-12 intact, power tone and sensation normal throughout.   Skin: Intact, no visible lesions or rashes.  Psych: Good eye contact, normal affect.  Memory intact, not anxious or depressed appearing.    Impression & Recommendations:  Problem # 1:  HYPERTENSION (ICD-401.9) Assessment Unchanged  His updated medication list for this problem includes:    Lotrel 10-40 Mg Caps (Amlodipine besy-benazepril hcl) ..... One daily    Propranolol Hcl 20 Mg Tabs (Propranolol hcl) ..... One tab by mouth 5 times daily for mood swings and headache  Orders: T-Basic Metabolic Panel [phone])  BP today: 100/80 Prior BP: 114/94 (11/10/2009)  Prior 10 Yr Risk Heart Disease: 7 % (11/11/2008)  Labs Reviewed: K+: 4.8 (01/30/2010) Creat: : 1.44 (01/30/2010)   Chol: 283 (01/30/2010)   HDL: 40 (01/30/2010)   LDL: 184 (01/30/2010)   TG: 294 (01/30/2010)  Problem # 2:  OBESITY NOS (ICD-278.00) Assessment: Comment Only  Ht: 73 (11/10/2009)   Wt: 326.12 (02/08/2010)   BMI: 39.19 (11/10/2009) therapeutic lifestyle change discussed and encouraged  Problem # 3:  TOBACCO ABUSE (ICD-305.1) Assessment: Comment Only  His updated medication list for this problem includes:    Nicotine 14 Mg/24hr Pt24 (Nicotine) .Marland Kitchen... Apply 1 patch daily  Encouraged smoking cessation and discussed different methods for smoking cessation.  Problem # 4:  BACK PAIN, CHRONIC (ICD-724.5) Assessment: Unchanged  His updated medication list for this problem includes:    Voltaren 75 Mg Tbec (Diclofenac sodium) .Marland Kitchen..Marland Kitchen Two times a day  - dr. Hilda Lias    Flexeril 10 Mg Tabs (Cyclobenzaprine hcl) ..... One three times a day as needed    Oxycodone-acetaminophen 10-325 Mg Tabs  (Oxycodone-acetaminophen) ..... One tab by mouth qid    Ibuprofen 800 Mg Tabs (Ibuprofen) .Marland Kitchen... Take 1 tablet by mouth three times a day for 1week, then as needed  Complete Medication List: 1)  Nexium 40 Mg Cpdr (Esomeprazole magnesium) .... One daily 2)  Xanax 1 Mg Tabs (Alprazolam) .... Three times a day 3)  Trazodone Hcl 150 Mg Tabs (Trazodone hcl) .... 2 tabs at bedtime 4)  Voltaren 75 Mg Tbec (Diclofenac sodium) .... Two times a day  - dr. Hilda Lias 5)  Maxalt-mlt 10 Mg Tbdp (Rizatriptan benzoate) .... Dissolve one under tongue with severe headaches. may repeat times one in 2 hours - do not exceed two pills in 24 hours. 6)  Haloperidol 10 Mg Tabs (Haloperidol) .... Two at bedtime per dr. Lolly Mustache 7)  Lotrel 10-40 Mg Caps (Amlodipine besy-benazepril hcl) .... One daily 8)  Zantac 150 Mg Tabs (Ranitidine hcl) .... One two times a day 9)  Flexeril 10 Mg Tabs (Cyclobenzaprine hcl) .... One three times a day as needed 10)  Niaspan 500 Mg Cr-tabs (Niacin (antihyperlipidemic)) .... One daily at bedtime 11)  Duoneb 0.5-2.5 (3) Mg/24ml Soln (Ipratropium-albuterol) .... Three times a day 12)  Oxycodone-acetaminophen 10-325 Mg Tabs (Oxycodone-acetaminophen) .... One tab by mouth qid 13)  Lipitor 40 Mg Tabs (Atorvastatin calcium) .... Take 1 tab by mouth at bedtime 14)  Neurontin 400 Mg Caps (Gabapentin) .... Take 3 capsules two times a day 15)  Antabuse 500 Mg Tabs (Disulfiram) .... One half tab by mouth qd 16)  Lidoderm 5 % Ptch (Lidocaine) .... Apply daily for 12 hours 17)  Hibiclens 4 % Liqd (Chlorhexidine gluconate) .... Use to wash left hand 3 times daily 18)  Nicotine 14 Mg/24hr Pt24 (Nicotine) .... Apply 1 patch daily 19)  Proventil Hfa 108 (90 Base) Mcg/act Aers (Albuterol sulfate) .... Two puffs every six hours as needed discontinue pro air 20)  Ciprofloxacin Hcl 500 Mg Tabs (Ciprofloxacin hcl) .... Take 1 two times a day x 7 days 21)  Benztropine Mesylate 1 Mg Tabs (Benztropine mesylate) ....  One tab by mouth at bedtime 22)  Propranolol Hcl 20 Mg Tabs (Propranolol hcl) .... One tab by mouth 5 times daily for mood swings and headache 23)  Viagra 50 Mg Tabs (Sildenafil citrate) .... One tablet 30 minutes bwefore intercourse as needed 24)  Ibuprofen 800 Mg Tabs (Ibuprofen) .... Take 1 tablet by mouth three times a day for 1week, then as needed  Other Orders: Medicare Electronic Prescription [id number]) T- Hemoglobin A1C (60454-09811) T-TSH [phone]) T-Hepatic Function [id number]) T-Lipid Profile [id number]) T- Hemoglobin A1C (24401-02725)  Patient Instructions: 1)  Please schedule a follow-up appointment in 3.5 months. 2)  Tobacco is very bad for your health and your loved ones! You Should stop smoking!.current 3PPD 3)  Stop Smoking Tips: Choose a Quit date. Cut down before the Quit date. decide what you will do as a substitute when you feel the urge to smoke(gum,toothpick,exercise). 4)  It is important that you exercise regularly at least 20 minutes 5 times a week. If you develop chest pain, have severe difficulty breathing, or feel very tired , stop exercising  immediately and seek medical attention. 5)  You need to lose weight. Consider a lower calorie diet and regular exercise.  6)  You need to change your eating, you have gained 34 pounds. 7)  hBA1C and tSH  today. 8)  Meds are sent in for back pain with sciatica also for dental pain. 9)  BMP prior to visit, ICD-9: 10)  Hepatic Panel prior to visit, ICD-9: 11)  Lipid Panel prior to visit, ICD-9:fasting in 3.5 months 12)  HbgA1C prior to visit, ICD-9: Prescriptions: IBUPROFEN 800 MG TABS (IBUPROFEN) Take 1 tablet by mouth three times a day for 1week, then as needed  #42 x 0   Entered and Authorized by:   Syliva Overman MD   Signed by:   Syliva Overman MD on 02/08/2010   Method used:   Electronically to        [facility], [facility] (retail)       [address]       [place], [place]   [address]       Ph: [phone]       Fax: [phone]   RxID:   [id number]    Orders Added: 1)  Est. Patient Level IV [53664] 2)  Medicare Electronic Prescription [G8553] 3)  T- Hemoglobin A1C [83036-23375] 4)  T-TSH [[id number]] 5)  T-Basic Metabolic Panel [[id number]] 6)  T-Hepatic Function [80076-22960] 7)  T-Lipid Profile [80061-22930] 8)  T- Hemoglobin A1C [83036-23375]

## 2010-03-30 NOTE — Miscellaneous (Signed)
Summary: NARC REFILL  Clinical Lists Changes  Medications: Rx of OXYCODONE-ACETAMINOPHEN 10-325 MG TABS (OXYCODONE-ACETAMINOPHEN) one tab by mouth qid;  #120 x 0;  Signed;  Entered by: Everitt Amber LPN;  Authorized by: Syliva Overman MD;  Method used: Handwritten    Prescriptions: OXYCODONE-ACETAMINOPHEN 10-325 MG TABS (OXYCODONE-ACETAMINOPHEN) one tab by mouth qid  #120 x 0   Entered by:   Everitt Amber LPN   Authorized by:   Syliva Overman MD   Signed by:   Everitt Amber LPN on 95/18/8416   Method used:   Handwritten   RxID:   [id number]

## 2010-03-30 NOTE — Progress Notes (Signed)
Summary: ZANTAC  Phone Note Call from Patient   Summary of Call: NEEDS HIS ZANTAC SENT TO [facility] Initial call taken by: Lind Guest,  November 18, 2009 10:41 AM    Prescriptions: ZANTAC 150 MG TABS (RANITIDINE HCL) One two times a day  #60 Each x 3   Entered by:   Adella Hare LPN   Authorized by:   Syliva Overman MD   Signed by:   Adella Hare LPN on 16/11/9602   Method used:   Electronically to        [facility], [facility] (retail)       [address]       [place], [place]  [address]       Ph: [phone]       Fax: [phone]   RxID:   [id number]

## 2010-03-30 NOTE — Progress Notes (Signed)
  Phone Note From Pharmacy   Caller: [facility] Summary of Call: requesting refill on maxalt- patient was here last month may i refill? Initial call taken by: Adella Hare LPN,  August 02, 1608 2:35 PM  Follow-up for Phone Call        No, denied. We have been trying to get ahold of pt.  He needs an appt. Follow-up by: Esperanza Sheets PA,  August 03, 2009 5:14 PM  Additional Follow-up for Phone Call Additional follow up Details #1::        noted Additional Follow-up by: Adella Hare LPN,  August 04, 9602 5:16 PM

## 2010-03-30 NOTE — Progress Notes (Signed)
Summary: please advise  Phone Note From Other Clinic   Summary of Call: Melissa from [facility] pharmacy, needs an rx for his abuterol for his August prescription.  Can you please call her at [phone].  She states that she faxed over a request last week.  Please advise. Initial call taken by: Lind Guest,  March 20, 2010 11:20 AM  Follow-up for Phone Call        Patient wants to use [facility] pharmacy because its too hard to get medications through the company Follow-up by: Everitt Amber LPN,  March 20, 2010 11:48 AM
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
## 2010-03-30 NOTE — Medication Information (Signed)
Summary: Tax adviser   Imported By: Lind Guest 03/01/2010 08:02:30  _____________________________________________________________________  External Attachment:    Type:   Image     Comment:   External Document

## 2010-03-30 NOTE — Progress Notes (Signed)
Summary: med  Phone Note Call from Patient   Summary of Call: left message to fax over his gab to [employer] [facility] [facility]  Initial call taken by: Lind Guest,  December 07, 2009 12:47 PM  Follow-up for Phone Call        faxed in to [facility]  Follow-up by: Everitt Amber LPN,  December 07, 2009 1:24 PM    Prescriptions: NEURONTIN 400 MG CAPS (GABAPENTIN) take 3 capsules two times a day  #180 x 3   Entered by:   Everitt Amber LPN   Authorized by:   Syliva Overman MD   Signed by:   Everitt Amber LPN on 16/11/9602   Method used:   Electronically to        [facility] (retail)       [address]       [place], [place]  [address]       Ph: [phone]       Fax: [phone]   RxID:   [id number]

## 2010-03-30 NOTE — Medication Information (Signed)
Summary: Tax adviser   Imported By: Lind Guest 03/03/2009 15:42:22  _____________________________________________________________________  External Attachment:    Type:   Image     Comment:   External Document

## 2010-03-30 NOTE — Assessment & Plan Note (Signed)
Summary: F UP FROM ED- room 1   Vital Signs:  Patient profile:   45 year old male Height:      73 inches Weight:      301 pounds BMI:     39.86 O2 Sat:      95 % on Room air Pulse rate:   89 / minute Resp:     16 per minute BP sitting:   90 / 58  (left arm)  Vitals Entered By: Adella Hare LPN (Jul 05, 2009 1:14 PM)  Nutrition Counseling: [name] BMI is greater than 25 and therefore counseled on weight management options. CC: er follow up Is Patient Diabetic? No Comments chronic arm and back pain   Primary Provider:  Syliva Overman MD  CC:  er follow up.  History of Present Illness: Pt was seen in the ER on  07-03-09.  Dx with Pyelonephritis.  States when he went to the ER he was having low back pain, dysuria and fever.  He is feeling better now & these syptoms have improved. He did receive an injection of Dilaudid while in ER, but declined prescription for oral pain meds due to pain contract with Dr Lodema Hong.  ER prescribed Augmentin.  Pt states he cannot take this.  Within 2 days of starting it became lightheaded and chest has become tight.  He has circled these possible side effects listed on the ER information sheet.  He has been  using his Duoneb at home & also has an albuterol MDI he uses as needed.  Pt had labs drawn this am. He is still seeing psychiatrist.  Is wanting to discontinue Antibuse, but psychiatrist told him to wait one more month.  Pt states he has no desire to drink.  States that he mentally has been doing well & will continue to f/u with mental health. He has been having problems with consitpation from pain meds.  Wonders if it is OK to use a stool softener.  States his current pain meds are working well though for his chronic pain.  Hx of htn. Taking medications as prescribed. No headache, chest pain or palpitations. Hx of Hyperlipidemia.  Taking medications as prescribed.  Not following a low fat diet or exercising regularly.          Current  Medications (verified): 1)  Nexium 40 Mg Cpdr (Esomeprazole Magnesium) .... One Daily 2)  Xanax 1 Mg Tabs (Alprazolam) .... Three Times A Day 3)  Propranolol Hcl 20 Mg  Tabs (Propranolol Hcl) .... One Four Times A Day For Mood Swings and Headache Per Psychiatry 4)  Trazodone Hcl 150 Mg Tabs (Trazodone Hcl) .... 2 Tabs At Bedtime 5)  Voltaren 75 Mg  Tbec (Diclofenac Sodium) .... Two Times A Day  - Dr. Hilda Lias 6)  Maxalt-Mlt 10 Mg  Tbdp (Rizatriptan Benzoate) .... Dissolve One Under Tongue With Severe Headaches. May Repeat Times One in 2 Hours - Do Not Exceed Two Pills in 24 Hours. 7)  Benztropine Mesylate 0.5 Mg  Tabs (Benztropine Mesylate) .... One At Bedtime - Dr. Lolly Mustache 8)  Haloperidol 10 Mg  Tabs (Haloperidol) .... Two At Bedtime Per Dr. Lolly Mustache 9)  Lotrel 10-40 Mg  Caps (Amlodipine Besy-Benazepril Hcl) .... One Daily 10)  Zantac 150 Mg Tabs (Ranitidine Hcl) .... One Two Times A Day 11)  Flexeril 10 Mg Tabs (Cyclobenzaprine Hcl) .... One Three Times A Day As Needed 12)  Niaspan 500 Mg Cr-Tabs (Niacin (Antihyperlipidemic)) .... One Daily At Bedtime 13)  Duoneb 0.5-2.5 (  3) Mg/59ml Soln (Ipratropium-Albuterol) .... Three Times A Day 14)  Oxycodone-Acetaminophen 10-325 Mg Tabs (Oxycodone-Acetaminophen) .... One Tab By Mouth Qid 15)  Lipitor 40 Mg Tabs (Atorvastatin Calcium) .... Take 1 Tab By Mouth At Bedtime 16)  Neurontin 400 Mg Caps (Gabapentin) .... One Capsule Twice Daily and Three At Bedtime 17)  Antabuse 500 Mg Tabs (Disulfiram) .... One Half Tab By Mouth Qd 18)  Lidoderm 5 % Ptch (Lidocaine) .... Apply Daily For 12 Hours 19)  Niaspan 500 Mg Cr-Tabs (Niacin (Antihyperlipidemic)) .... 2 Tablets Daily 20)  Hibiclens 4 % Liqd (Chlorhexidine Gluconate) .... Use To Wash Left Hand 3 Times Daily 21)  Nicotine 14 Mg/24hr Pt24 (Nicotine) .... Apply 1 Patch Daily 22)  Proventil Hfa 108 (90 Base) Mcg/act Aers (Albuterol Sulfate) .... Two Puffs Every Six Hours As Needed Discontinue Pro Air  Allergies  (verified): 1)  ! Sulfa  Review of Systems General:  Denies chills and fever. ENT:  Denies earache, nasal congestion, and sore throat. CV:  Denies chest pain or discomfort and palpitations. Resp:  Complains of shortness of breath and wheezing; denies cough and sputum productive. GI:  Denies abdominal pain, change in bowel habits, loss of appetite, nausea, and vomiting. GU:  Denies dysuria, hematuria, and urinary frequency. MS:  Denies low back pain and mid back pain.  Physical Exam  General:  Well-developed,well-nourished,in no acute distress; alert,appropriate and cooperative throughout examination Head:  Normocephalic and atraumatic without obvious abnormalities. No apparent alopecia or balding. Ears:  External ear exam shows no significant lesions or deformities.  Otoscopic examination reveals clear canals, tympanic membranes are intact bilaterally without bulging, retraction, inflammation or discharge. Hearing is grossly normal bilaterally. Nose:  External nasal examination shows no deformity or inflammation. Nasal mucosa are pink and moist without lesions or exudates. Mouth:  Oral mucosa and oropharynx without lesions or exudates.   Neck:  No deformities, masses, or tenderness noted. Lungs:  Normal respiratory effort, chest expands symmetrically. Lungs are clear to auscultation, no crackles or wheezes. Heart:  Normal rate and regular rhythm. S1 and S2 normal without gallop, murmur, click, rub or other extra sounds. Abdomen:  soft, non-tender, no masses, no hepatomegaly, and no splenomegaly.  Neg [employer] Extremities:  Contractures noted Lt hand. Cervical Nodes:  No lymphadenopathy noted Psych:  Cognition and judgment appear intact. Alert and cooperative with normal attention span and concentration. No apparent delusions, illusions, hallucinations   Impression & Recommendations:  Problem # 1:  PYELONEPHRITIS, ACUTE (ICD-590.10) Assessment Improved Will change antibiotic to Cipro per  pt request. Advised pt that though his syptoms have improved and urine no longer shows infection I would still recommend 7 days of antibiotic therapy.  Problem # 2:  COPD, MILD (ICD-496) Assessment: Deteriorated  His updated medication list for this problem includes:    Duoneb 0.5-2.5 (3) Mg/22ml Soln (Ipratropium-albuterol) .Marland Kitchen... Three times a day    Proventil Hfa 108 (90 Base) Mcg/act Aers (Albuterol sulfate) .Marland Kitchen..Marland Kitchen Two puffs every six hours as needed discontinue pro air  Problem # 3:  HYPERTENSION (ICD-401.9) Assessment: Comment Only  His updated medication list for this problem includes:    Propranolol Hcl 20 Mg Tabs (Propranolol hcl) ..... One four times a day for mood swings and headache per psychiatry    Lotrel 10-40 Mg Caps (Amlodipine besy-benazepril hcl) ..... One daily  BP today: 90/58 Prior BP: 100/70 (04/28/2009)  Prior 10 Yr Risk Heart Disease: 7 % (11/11/2008)  Labs Reviewed: K+: 4.9 (04/01/2009) Creat: : 1.21 (04/01/2009)  Chol: 171 (04/01/2009)   HDL: 39 (04/01/2009)   LDL: 109 (04/01/2009)   TG: 116 (04/01/2009)  Problem # 4:  HYPERLIPIDEMIA (ICD-272.4) Assessment: Comment Only  His updated medication list for this problem includes:    Niaspan 500 Mg Cr-tabs (Niacin (antihyperlipidemic)) ..... One daily at bedtime    Lipitor 40 Mg Tabs (Atorvastatin calcium) .Marland Kitchen... Take 1 tab by mouth at bedtime    Niaspan 500 Mg Cr-tabs (Niacin (antihyperlipidemic)) .Marland Kitchen... 2 tablets daily  Labs Reviewed: SGOT: 7 (04/01/2009)   SGPT: 24 (04/01/2009)  Lipid Goals: Chol Goal: 200 (04/29/2006)   HDL Goal: 40 (04/29/2006)   LDL Goal: 130 (05/28/2006)   TG Goal: 150 (04/29/2006)  Prior 10 Yr Risk Heart Disease: 7 % (11/11/2008)   HDL:39 (04/01/2009), 27 (11/03/2008)  LDL:109 (04/01/2009), 84 (62/13/0865)  Chol:171 (04/01/2009), 165 (11/03/2008)  Trig:116 (04/01/2009), 272 (11/03/2008)  Problem # 5:  ARM PAIN, LEFT (ICD-729.5) Assessment: Improved Currently controlled with pain  meds.  Complete Medication List: 1)  Nexium 40 Mg Cpdr (Esomeprazole magnesium) .... One daily 2)  Xanax 1 Mg Tabs (Alprazolam) .... Three times a day 3)  Propranolol Hcl 20 Mg Tabs (Propranolol hcl) .... One four times a day for mood swings and headache per psychiatry 4)  Trazodone Hcl 150 Mg Tabs (Trazodone hcl) .... 2 tabs at bedtime 5)  Voltaren 75 Mg Tbec (Diclofenac sodium) .... Two times a day  - dr. Hilda Lias 6)  Maxalt-mlt 10 Mg Tbdp (Rizatriptan benzoate) .... Dissolve one under tongue with severe headaches. may repeat times one in 2 hours - do not exceed two pills in 24 hours. 7)  Benztropine Mesylate 0.5 Mg Tabs (Benztropine mesylate) .... One at bedtime - dr. Lolly Mustache 8)  Haloperidol 10 Mg Tabs (Haloperidol) .... Two at bedtime per dr. Lolly Mustache 9)  Lotrel 10-40 Mg Caps (Amlodipine besy-benazepril hcl) .... One daily 10)  Zantac 150 Mg Tabs (Ranitidine hcl) .... One two times a day 11)  Flexeril 10 Mg Tabs (Cyclobenzaprine hcl) .... One three times a day as needed 12)  Niaspan 500 Mg Cr-tabs (Niacin (antihyperlipidemic)) .... One daily at bedtime 13)  Duoneb 0.5-2.5 (3) Mg/38ml Soln (Ipratropium-albuterol) .... Three times a day 14)  Oxycodone-acetaminophen 10-325 Mg Tabs (Oxycodone-acetaminophen) .... One tab by mouth qid 15)  Lipitor 40 Mg Tabs (Atorvastatin calcium) .... Take 1 tab by mouth at bedtime 16)  Neurontin 400 Mg Caps (Gabapentin) .... One capsule twice daily and three at bedtime 17)  Antabuse 500 Mg Tabs (Disulfiram) .... One half tab by mouth qd 18)  Lidoderm 5 % Ptch (Lidocaine) .... Apply daily for 12 hours 19)  Niaspan 500 Mg Cr-tabs (Niacin (antihyperlipidemic)) .... 2 tablets daily 20)  Hibiclens 4 % Liqd (Chlorhexidine gluconate) .... Use to wash left hand 3 times daily 21)  Nicotine 14 Mg/24hr Pt24 (Nicotine) .... Apply 1 patch daily 22)  Proventil Hfa 108 (90 Base) Mcg/act Aers (Albuterol sulfate) .... Two puffs every six hours as needed discontinue pro air 23)   Ciprofloxacin Hcl 500 Mg Tabs (Ciprofloxacin hcl) .... Take 1 two times a day x 7 days  Other Orders: Urinalysis ([id number]) Depo- Medrol 80mg  (J1040) Admin of Therapeutic Inj  intramuscular or subcutaneous (84132)  Patient Instructions: 1)  Please schedule a follow-up appointment in 3 months. 2)  Tobacco is very bad for your health and your loved ones! You Should stop smoking!. 3)  Stop Smoking Tips: Choose a Quit date. Cut down before the Quit date. decide what you will do as a  substitute when you feel the urge to smoke(gum,toothpick,exercise). 4)  It is important that you exercise regularly at least 20 minutes 5 times a week. If you develop chest pain, have severe difficulty breathing, or feel very tired , stop exercising immediately and seek medical attention. 5)  You need to lose weight. Consider a lower calorie diet and regular exercise.  6)  You may use an over the counter stool softener such as [place] to help with your constipation.  A high fiber diet may help too. Prescriptions: CIPROFLOXACIN HCL 500 MG TABS (CIPROFLOXACIN HCL) take 1 two times a day x 7 days  #14 x 0   Entered and Authorized by:   Esperanza Sheets PA   Signed by:   Esperanza Sheets PA on 07/05/2009   Method used:   Electronically to        [facility] (retail)       [address]       [place], [place]  [address]       Ph: [phone]       Fax: [phone]   RxID:   [id number]   Laboratory Results   Urine Tests  Date/Time Received: Jul 05, 2009 1:59 PM  Date/Time Reported: Jul 05, 2009 1:59 PM   Routine Urinalysis   Color: orange Appearance: Cloudy Glucose: negative   (Normal Range: Negative) Bilirubin: moderate   (Normal Range: Negative) Ketone: smal (15)   (Normal Range: Negative) Spec. Gravity: 1.025   (Normal Range: 1.003-1.035) Blood: small   (Normal Range: Negative) pH: 5.0   (Normal Range: 5.0-8.0) Protein: >=300   (Normal Range:  Negative) Urobilinogen: 2.0   (Normal Range: 0-1) Nitrite: negative   (Normal Range: Negative) Leukocyte Esterace: negative   (Normal Range: Negative)         Medication Administration  Injection # 1:    Medication: Depo- Medrol 80mg     Diagnosis: BACK PAIN, CHRONIC (ICD-724.5)    Route: IM    Site: RUOQ gluteus    Exp Date: 1/12    Lot #: [id number]    Mfr: [employer]    Patient tolerated injection without complications    Given by: Adella Hare LPN (Jul 05, 2009 2:01 PM)  Orders Added: 1)  Urinalysis [81003-65000] 2)  Depo- Medrol 80mg  [J1040] 3)  Admin of Therapeutic Inj  intramuscular or subcutaneous [96372] 4)  Est. Patient Level IV [84132]   Appended Document: F UP FROM ED- room 1 PMH reviewed during visit.

## 2010-03-30 NOTE — Progress Notes (Signed)
  Phone Note Call from Patient   Summary of Call: Needs Ventolin changed to proair due to insurance purposes. [facility] Initial call taken by: Everitt Amber,  April 11, 2009 1:19 PM  Follow-up for Phone Call        pls change to proair 2 puffs every 6 to 8 hrs as needed #1 refill 3 Follow-up by: Syliva Overman MD,  April 11, 2009 5:09 PM  Additional Follow-up for Phone Call Additional follow up Details #1::        Rx Called In Additional Follow-up by: Adella Hare LPN,  April 12, 2009 4:13 PM

## 2010-03-30 NOTE — Progress Notes (Signed)
Summary: PHARMACY  Phone Note Call from Patient   Summary of Call: LEFT MESSAGE [facility]. NEEDS FOR YOU TO CALL THEM ON [place] AT [phone] Initial call taken by: Lind Guest,  March 23, 2010 3:43 PM  Follow-up for Phone Call        this is a medical supply co and we do not reply to these unless a patient specifically requests [place] to do so Follow-up by: Adella Hare LPN,  March 24, 2010 1:32 PM

## 2010-03-30 NOTE — Letter (Signed)
Summary: Letter  Letter   Imported By: Lind Guest 07/07/2009 10:39:33  _____________________________________________________________________  External Attachment:    Type:   Image     Comment:   External Document

## 2010-03-30 NOTE — Miscellaneous (Signed)
Summary: labs  Clinical Lists Changes  Orders: Added new Test order of T-Basic Metabolic Panel ([id number]) - Signed Added new Test order of T-Hepatic Function ([id number]) - Signed Added new Test order of T-Lipid Profile ([id number]) - Signed 

## 2010-03-30 NOTE — Medication Information (Signed)
Summary: Tax adviser   Imported By: Lind Guest 09/01/2009 15:30:06  _____________________________________________________________________  External Attachment:    Type:   Image     Comment:   External Document

## 2010-03-30 NOTE — Progress Notes (Signed)
Summary: hand infected  Phone Note Call from Patient   Summary of Call: hand is still infected and out of anitbotics. now has a fugus on it and under nail [phone] Initial call taken by: Rudene Anda,  May 20, 2009 8:15 AM  Follow-up for Phone Call        Tell pt to use antibiotic ointment on the areas of infection & cover with bandage.  If is really red & swollen, or has a fever with it, then needs to go to urgent care. Appt on Monday.  Follow-up by: Esperanza Sheets PA,  May 20, 2009 9:51 AM  Additional Follow-up for Phone Call Additional follow up Details #1::        Luann to schedule app for monday  Patient aware Additional Follow-up by: Everitt Amber LPN,  May 20, 2009 1:18 PM

## 2010-03-30 NOTE — Progress Notes (Signed)
  Phone Note Call from Patient   Summary of Call: Patient called in and had gotten a cut a week or so ago on his leg near the knee area and it was very swollen now and draining a puss and very red and I advised he go to the urgent care or ER Initial call taken by: Everitt Amber LPN,  December 07, 2009 4:47 PM

## 2010-03-30 NOTE — Medication Information (Signed)
Summary: Tax adviser   Imported By: Lind Guest 12/28/2009 13:13:41  _____________________________________________________________________  External Attachment:    Type:   Image     Comment:   External Document

## 2010-03-30 NOTE — Progress Notes (Signed)
Summary: nebulizar  Phone Note Call from Patient   Summary of Call: needs a written rx for his nebulizar and wants to pick up to take to the pharmacy  call back at [phone] when ready he needs today please Initial call taken by: Lind Guest,  March 13, 2010 11:35 AM  Follow-up for Phone Call        script written,pls scan and call ptto collect Follow-up by: Syliva Overman MD,  March 13, 2010 12:53 PM  Additional Follow-up for Phone Call Additional follow up Details #1::        patient aware Additional Follow-up by: Lind Guest,  March 13, 2010 1:16 PM

## 2010-03-30 NOTE — Progress Notes (Signed)
Summary: lab order  Phone Note Call from Patient   Summary of Call: please make him a copy of his lab order and put up here and he will pick up  Initial call taken by: Lind Guest,  June 10, 2009 4:14 PM  Follow-up for Phone Call        labs ordered Follow-up by: Adella Hare LPN,  June 10, 2009 4:26 PM    +

## 2010-03-30 NOTE — Letter (Signed)
[facility]   Imported By: Lind Guest 10/14/2009 15:04:04  _____________________________________________________________________  External Attachment:    Type:   Image     Comment:   External Document

## 2010-03-30 NOTE — Progress Notes (Signed)
Summary: med  Phone Note Call from Patient   Summary of Call: needs medicine faxed over [phone] Initial call taken by: Rudene Anda,  December 07, 2009 9:34 AM  Follow-up for Phone Call        Please see previous message regarding same matter Follow-up by: Everitt Amber LPN,  December 07, 2009 1:24 PM

## 2010-03-30 NOTE — Medication Information (Signed)
Summary: Tax adviser   Imported By: Lind Guest 01/26/2010 14:00:42  _____________________________________________________________________  External Attachment:    Type:   Image     Comment:   External Document

## 2010-03-30 NOTE — Letter (Signed)
[facility]   Imported By: Lind Guest 01/09/2010 17:02:29  _____________________________________________________________________  External Attachment:    Type:   Image     Comment:   External Document

## 2010-03-30 NOTE — Letter (Signed)
[facility]   Imported By: Lind Guest 10/21/2009 13:24:24  _____________________________________________________________________  External Attachment:    Type:   Image     Comment:   External Document
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
## 2010-03-30 NOTE — Progress Notes (Signed)
Summary: nexium  Phone Note Call from Patient   Summary of Call: patient needs his Nexium called into [facility], he states he called them 3 hours ago and they were suppose to fax over request. Initial call taken by: Curtis Sites,  February 13, 2010 3:03 PM    Prescriptions: NEXIUM 40 MG CPDR (ESOMEPRAZOLE MAGNESIUM) One daily  #30 Each x 3   Entered by:   Adella Hare LPN   Authorized by:   Syliva Overman MD   Signed by:   Adella Hare LPN on 16/11/9602   Method used:   Electronically to        [facility] (retail)       [address]       [place], [place]  [address]       Ph: [phone]       Fax: [phone]   RxID:   [id number]

## 2010-03-30 NOTE — Progress Notes (Signed)
Summary: rx   Phone Note Call from Patient   Summary of Call: would like to know if rx is ready to be picked up tomorrow. [phone] Initial call taken by: Rudene Anda,  June 02, 2009 9:59 AM  Follow-up for Phone Call        advised yes Follow-up by: Adella Hare LPN,  June 02, 2009 10:01 AM

## 2010-03-30 NOTE — Letter (Signed)
Summary: Laboratory/X-Ray Results  [facility]  [address]   [place], [place] [address]   Phone: [phone]  Fax: [phone]    Lab/X-Ray Results  August 10, 2009  MRN: [id number]  Gavin Gray [address] [facility] [address] [place], [place]  [address]  Dear Gavin Gray,  The results of your recent lab/x-ray has been reviewed and were found:  Your lab work, including your kidney tests, were normal this time.   If you have any questions, please contact our office.     Esperanza Sheets PA

## 2010-03-30 NOTE — Miscellaneous (Signed)
Summary: Narc refill  Clinical Lists Changes  Medications: Changed medication from OXYCODONE-ACETAMINOPHEN 10-325 MG TABS (OXYCODONE-ACETAMINOPHEN) one tab by mouth qid to OXYCODONE-ACETAMINOPHEN 10-325 MG TABS (OXYCODONE-ACETAMINOPHEN) one tab by mouth qid - Signed Rx of OXYCODONE-ACETAMINOPHEN 10-325 MG TABS (OXYCODONE-ACETAMINOPHEN) one tab by mouth qid;  #120 x 0;  Signed;  Entered by: Everitt Amber;  Authorized by: Syliva Overman MD;  Method used: Handwritten    Prescriptions: OXYCODONE-ACETAMINOPHEN 10-325 MG TABS (OXYCODONE-ACETAMINOPHEN) one tab by mouth qid  #120 x 0   Entered by:   Everitt Amber   Authorized by:   Syliva Overman MD   Signed by:   Everitt Amber on 03/03/2009   Method used:   Handwritten   RxID:   [id number]

## 2010-03-30 NOTE — Progress Notes (Signed)
  Phone Note Outgoing Call   Summary of Call: Pt needs to be seen by myself or Dr Lodema Hong before he can have his pain medications in June.  I have tried to reach him by phone and mail, and he cancelled his appts with our office this week.   Initial call taken by: Esperanza Sheets PA,  Jul 12, 2009 1:37 PM  Follow-up for Phone Call        NUMBER DISCONNECTED I WILL SEND A LETTER TO HIM TODAY Follow-up by: Lind Guest,  Jul 12, 2009 4:13 PM

## 2010-03-31 ENCOUNTER — Encounter: Payer: Self-pay | Admitting: Family Medicine

## 2010-04-05 NOTE — Progress Notes (Signed)
Summary: pick up rx  Phone Note Call from Patient   Summary of Call: would like to know if he can get rx today [phone] Initial call taken by: Rudene Anda,  March 30, 2010 8:23 AM  Follow-up for Phone Call        PATIENT ALREADY PICKED UP RX  Follow-up by: Everitt Amber LPN,  March 30, 2010 1:26 PM

## 2010-04-05 NOTE — Medication Information (Signed)
Summary: Tax adviser   Imported By: Lind Guest 03/31/2010 15:51:50  _____________________________________________________________________  External Attachment:    Type:   Image     Comment:   External Document

## 2010-04-11 ENCOUNTER — Encounter (INDEPENDENT_AMBULATORY_CARE_PROVIDER_SITE_OTHER): Payer: 59 | Admitting: Psychiatry

## 2010-04-11 DIAGNOSIS — F331 Major depressive disorder, recurrent, moderate: Secondary | ICD-10-CM

## 2010-04-17 ENCOUNTER — Encounter (HOSPITAL_COMMUNITY): Payer: 59 | Admitting: Psychiatry

## 2010-04-28 ENCOUNTER — Encounter (INDEPENDENT_AMBULATORY_CARE_PROVIDER_SITE_OTHER): Payer: 59 | Admitting: Psychiatry

## 2010-04-28 ENCOUNTER — Encounter: Payer: Self-pay | Admitting: Family Medicine

## 2010-04-28 ENCOUNTER — Telehealth (INDEPENDENT_AMBULATORY_CARE_PROVIDER_SITE_OTHER): Payer: Self-pay | Admitting: *Deleted

## 2010-04-28 DIAGNOSIS — F411 Generalized anxiety disorder: Secondary | ICD-10-CM

## 2010-04-28 DIAGNOSIS — F332 Major depressive disorder, recurrent severe without psychotic features: Secondary | ICD-10-CM

## 2010-05-03 ENCOUNTER — Encounter: Payer: Self-pay | Admitting: Family Medicine

## 2010-05-04 NOTE — Miscellaneous (Signed)
Summary: NARC REFILL  Clinical Lists Changes  Medications: Rx of OXYCODONE-ACETAMINOPHEN 10-325 MG TABS (OXYCODONE-ACETAMINOPHEN) one tab by mouth qid;  #120 x 0;  Signed;  Entered by: Everitt Amber LPN;  Authorized by: Syliva Overman MD;  Method used: Handwritten    Prescriptions: OXYCODONE-ACETAMINOPHEN 10-325 MG TABS (OXYCODONE-ACETAMINOPHEN) one tab by mouth qid  #120 x 0   Entered by:   Everitt Amber LPN   Authorized by:   Syliva Overman MD   Signed by:   Everitt Amber LPN on 16/11/9602   Method used:   Handwritten   RxID:   [id number]

## 2010-05-09 NOTE — Progress Notes (Signed)
Summary: referral  Phone Note Call from Patient   Summary of Call: needs a referral to dr. Hilda Lias for left knee popping and hurts. [phone] Initial call taken by: Rudene Anda,  April 28, 2010 1:01 PM  Follow-up for Phone Call        pt was referred to dr. Jenetta Downer office. I spoke with debra and she told me to fax over last office notes and they would call pt with appt. Follow-up by: Rudene Anda,  May 01, 2010 1:51 PM

## 2010-05-12 ENCOUNTER — Other Ambulatory Visit (HOSPITAL_COMMUNITY): Payer: Self-pay | Admitting: Orthopaedic Surgery

## 2010-05-12 DIAGNOSIS — M25569 Pain in unspecified knee: Secondary | ICD-10-CM

## 2010-05-16 ENCOUNTER — Other Ambulatory Visit: Payer: Self-pay | Admitting: Family Medicine

## 2010-05-16 ENCOUNTER — Ambulatory Visit (HOSPITAL_COMMUNITY)
Admission: RE | Admit: 2010-05-16 | Discharge: 2010-05-16 | Disposition: A | Discharge status: Home / Self Care | Payer: Medicare Other | Source: Ambulatory Visit | Attending: Orthopaedic Surgery | Admitting: Orthopaedic Surgery

## 2010-05-16 ENCOUNTER — Encounter: Payer: Self-pay | Admitting: Family Medicine

## 2010-05-16 DIAGNOSIS — M25569 Pain in unspecified knee: Secondary | ICD-10-CM | POA: Insufficient documentation

## 2010-05-16 DIAGNOSIS — M7120 Synovial cyst of popliteal space [Baker], unspecified knee: Secondary | ICD-10-CM | POA: Insufficient documentation

## 2010-05-16 DIAGNOSIS — M25469 Effusion, unspecified knee: Secondary | ICD-10-CM | POA: Insufficient documentation

## 2010-05-16 LAB — COMPREHENSIVE METABOLIC PANEL
Alkaline Phosphatase: 96 U/L (ref 39–117)
BUN: 10 mg/dL (ref 6–23)
CO2: 24 mEq/L (ref 19–32)
Chloride: 104 mEq/L (ref 96–112)
Creatinine, Ser: 1.39 mg/dL (ref 0.4–1.5)
GFR calc non Af Amer: 56 mL/min — ABNORMAL LOW (ref >60)
Glucose, Bld: 106 mg/dL — ABNORMAL HIGH (ref 70–99)
Potassium: 4.2 mEq/L (ref 3.5–5.1)
Total Bilirubin: 1.2 mg/dL (ref 0.3–1.2)

## 2010-05-16 LAB — URINALYSIS, ROUTINE W REFLEX MICROSCOPIC
Glucose, UA: NEGATIVE mg/dL
Protein, ur: 100 mg/dL — AB
Specific Gravity, Urine: 1.02 (ref 1.005–1.030)
pH: 6.5 (ref 5.0–8.0)

## 2010-05-16 LAB — URINE MICROSCOPIC-ADD ON

## 2010-05-16 LAB — DIFFERENTIAL
Basophils Absolute: 0 10*3/uL (ref 0.0–0.1)
Basophils Relative: 0 % (ref 0–1)
Lymphocytes Relative: 12 % (ref 12–46)
Neutro Abs: 13.8 10*3/uL — ABNORMAL HIGH (ref 1.7–7.7)
Neutrophils Relative %: 80 % — ABNORMAL HIGH (ref 43–77)

## 2010-05-16 LAB — CBC
HCT: 40.3 % (ref 39.0–52.0)
Platelets: 345 10*3/uL (ref 150–400)
RDW: 13.5 % (ref 11.5–15.5)

## 2010-05-16 LAB — LIPASE, BLOOD: Lipase: 15 U/L (ref 11–59)

## 2010-05-17 ENCOUNTER — Encounter: Payer: Self-pay | Admitting: Family Medicine

## 2010-05-19 ENCOUNTER — Ambulatory Visit (INDEPENDENT_AMBULATORY_CARE_PROVIDER_SITE_OTHER): Payer: 59 | Admitting: Family Medicine

## 2010-05-19 ENCOUNTER — Other Ambulatory Visit: Payer: Self-pay | Admitting: *Deleted

## 2010-05-19 ENCOUNTER — Encounter: Payer: Self-pay | Admitting: Family Medicine

## 2010-05-19 VITALS — BP 130/78 | HR 82 | Resp 16 | Ht 72.5 in | Wt 324.1 lb

## 2010-05-19 DIAGNOSIS — J449 Chronic obstructive pulmonary disease, unspecified: Secondary | ICD-10-CM

## 2010-05-19 DIAGNOSIS — M899 Disorder of bone, unspecified: Secondary | ICD-10-CM

## 2010-05-19 DIAGNOSIS — J302 Other seasonal allergic rhinitis: Secondary | ICD-10-CM

## 2010-05-19 DIAGNOSIS — E66813 Obesity, class 3: Secondary | ICD-10-CM

## 2010-05-19 DIAGNOSIS — R7301 Impaired fasting glucose: Secondary | ICD-10-CM

## 2010-05-19 DIAGNOSIS — R5381 Other malaise: Secondary | ICD-10-CM

## 2010-05-19 DIAGNOSIS — R5383 Other fatigue: Secondary | ICD-10-CM

## 2010-05-19 DIAGNOSIS — J309 Allergic rhinitis, unspecified: Secondary | ICD-10-CM

## 2010-05-19 DIAGNOSIS — E785 Hyperlipidemia, unspecified: Secondary | ICD-10-CM

## 2010-05-19 DIAGNOSIS — I10 Essential (primary) hypertension: Secondary | ICD-10-CM

## 2010-05-19 MED ORDER — FLUTICASONE PROPIONATE 50 MCG/ACT NA SUSP: 1.0000 | Freq: Every day | NASAL | Status: DC

## 2010-05-19 MED ORDER — OXYCODONE-ACETAMINOPHEN 10-325 MG PO TABS: 1.0000 | ORAL_TABLET | Freq: Four times a day (QID) | ORAL | Status: DC

## 2010-05-19 NOTE — Patient Instructions (Addendum)
  F/U in 4 months.  Congrats on reducing  Cigarettes please start smoking a maximum of 6 per day. Cut down by 1 cigarette every 1 to 2 weeks pls.  We will give you extra information on smoking cessation.  You have a new medication for allergies.  Fasting labs today, lipid, hepatic, chem 7 and HBA1C.   Regular exercise and a diet low in fried and fatty foods is encouraged.  I hope your left knee gets better soon.

## 2010-05-21 ENCOUNTER — Encounter: Payer: Self-pay | Admitting: Family Medicine

## 2010-05-21 DIAGNOSIS — J302 Other seasonal allergic rhinitis: Secondary | ICD-10-CM | POA: Insufficient documentation

## 2010-05-21 NOTE — Progress Notes (Signed)
  Subjective:    Patient ID: Gavin Gray, male    DOB: 06-Feb-1966, 45 y.o.   MRN: [id number]  HPI Patient reports that he is doing fairly well.He is here for f/u of his chronic problems with no new concerns. He has cut back significantly on cigarettes with  A plan to quit. He is attempIong healthier eating with a lower fat diet, but states this is a challenge based on the food prepared at home. Denies recent fever or chills. Denies sinus pressure, nasal congestion, ear pain or sore throat. Denies chest congestion, productive cough or wheezing. Denies chest pains, palpitations, paroxysmal nocturnal dyspnea, orthopnea and leg swelling Denies abdominal pain, nausea, vomiting,diarrhea or constipation.  Denies rectal bleeding or change in bowel movement. Denies dysuria, frequency, hesitancy or incontinence. Denies headaches, seizure, numbness, or tingling. Denies uncontrolled  depression, anxiety or insomnia.maintains regular contact both with his therapist and psychiatrist .Denies skin break down or rash. Recent flare of left knee pain with reduced mobility , which is being followed by his orthopedist.       Review of Systems See above.     Objective:   Physical Exam    Patient alert and oriented and in no Cardiopulmonary distress.  HEENT: No facial asymmetry, EOMI, no sinus tenderness, TM's clear, Oropharynx pink and moist.  Neck supple no adenopathy.no JVD, no thyromegaly.  Chest: Clear to auscultation bilaterally.decreased air entry bilaterally.  CVS: S1, S2 no murmurs, no S3.  ABD: Soft non tender. Bowel sounds normal.  Ext: No edema  EP:PIRJJOACZ ROM spine, and left  Knee adequate in shoulders and hips.  Skin: Intact, no ulcerations or rash noted.  Psych: Good eye contact, normal affect. Memory intact not anxious or depressed appearing.  CNS: CN 2-12 intact, power, tone and sensation normal throughout.     Assessment & Plan:  HTN: controlled. Nicotine use  improved, pt encouraged to continue to cut back every 7 to 10 days with a view to quitting. 3. Hyperlipidemia; fasting labs to be checked. Low fat diet discussed and encouraged. 4.Obesity unchanged, pt encouraged to lower caloric intake and increase activity. 5.cOPD; unchanged, pt needs to quit, smoking, he understands.

## 2010-05-22 ENCOUNTER — Encounter (INDEPENDENT_AMBULATORY_CARE_PROVIDER_SITE_OTHER): Payer: 59 | Admitting: Psychiatry

## 2010-05-22 DIAGNOSIS — F411 Generalized anxiety disorder: Secondary | ICD-10-CM

## 2010-05-22 DIAGNOSIS — F332 Major depressive disorder, recurrent severe without psychotic features: Secondary | ICD-10-CM

## 2010-05-23 ENCOUNTER — Other Ambulatory Visit: Payer: Self-pay | Admitting: Family Medicine

## 2010-05-25 NOTE — Letter (Signed)
Summary: Medication Reconciliation Letter  [facility]  [address]   [place], [place] [address]   Phone: [phone]  Fax: [phone]    05/19/2010 MRN: [id number]  Gavin Gray [address] [facility] [address] [place], [place]  [address]  [place]  Dear Mr. FITZNER,  We are glad to see you again today!  Please take a moment to review and update your medication list and Wellness questions then give this paper to the nurse when she calls you back for your appointment.Please include the over the counter medicines that you are currently taking.  Medication List:  NEXIUM 40 MG CPDR (ESOMEPRAZOLE MAGNESIUM) One daily XANAX 1 MG TABS (ALPRAZOLAM) three times a day TRAZODONE HCL 150 MG TABS (TRAZODONE HCL) 2 tabs at bedtime VOLTAREN 75 MG  TBEC (DICLOFENAC SODIUM) two times a day  - Dr. Hilda Lias MAXALT-MLT 10 MG  TBDP (RIZATRIPTAN BENZOATE) Dissolve one under tongue with severe headaches. May repeat times one in 2 hours - do not exceed two pills in 24 hours. HALOPERIDOL 10 MG  TABS (HALOPERIDOL) two at bedtime per Dr. Lolly Mustache LOTREL 10-40 MG  CAPS (AMLODIPINE BESY-BENAZEPRIL HCL) One daily ZANTAC 150 MG TABS (RANITIDINE HCL) One two times a day FLEXERIL 10 MG TABS (CYCLOBENZAPRINE HCL) One three times a day as needed NIASPAN 500 MG CR-TABS (NIACIN (ANTIHYPERLIPIDEMIC)) One daily at bedtime DUONEB 0.5-2.5 (3) MG/3ML SOLN (IPRATROPIUM-ALBUTEROL) three times a day OXYCODONE-ACETAMINOPHEN 10-325 MG TABS (OXYCODONE-ACETAMINOPHEN) one tab by mouth qid LIPITOR 40 MG TABS (ATORVASTATIN CALCIUM) Take 1 tab by mouth at bedtime NEURONTIN 400 MG CAPS (GABAPENTIN) take 3 capsules two times a day ANTABUSE 500 MG TABS (DISULFIRAM) one half tab by mouth qd LIDODERM 5 % PTCH (LIDOCAINE) apply daily for 12 hours HIBICLENS 4 % LIQD (CHLORHEXIDINE GLUCONATE) use to wash left hand 3 times daily NICOTINE 14 MG/24HR PT24 (NICOTINE) apply 1 patch daily PROVENTIL HFA 108 (90 BASE) MCG/ACT AERS  (ALBUTEROL SULFATE) two puffs every six hours as needed discontinue pro air CIPROFLOXACIN HCL 500 MG TABS (CIPROFLOXACIN HCL) take 1 two times a day x 7 days BENZTROPINE MESYLATE 1 MG TABS (BENZTROPINE MESYLATE) one tab by mouth at bedtime PROPRANOLOL HCL 20 MG TABS (PROPRANOLOL HCL) one tab by mouth 5 times daily for mood swings and headache VIAGRA 50 MG TABS (SILDENAFIL CITRATE) one tablet 30 minutes bwefore intercourse as needed IBUPROFEN 800 MG TABS (IBUPROFEN) Take 1 tablet by mouth three times a day for 1week, then as needed      Medication allergies:______________________________________________  Name of your current Pharmacy and location:_____________________________  ____________________________________________  Last Colonoscopy: Date:_____________ Where:____________________________  Last Tetanus Shot:__________ Last Flu Shot:__________   Last Pneumonia Shot:________  If you are Diabetic when and where was your last eye exam?______________  __________________________________________________________  Daytime Phone number:____________________________  Do you currently smoke or use tobacco products: Yes _____   No _____ If "Yes", how many packs per day: ______________  Has it been more than one year since your last visit to a physician? Yes______  No_______  Have you been seen in the Emergency Department since your last visit to the office? Yes______  No_______ If yes, please provide the name, location, and date seen:__________________________________________  Have you been admitted to the hospital since your last visit to the office? Yes______  No_______ If yes, please provide the name, location, and date admitted:__________________________________________  Have you been seen by another physician since your last visit to the office? Yes______  No_______ If yes, please provide the name,  location, and date seen:__________________________________________  Have you made  any changes to your medications since your last visit to the office? Yes______  No_______ If yes, please mark changes on the list above.  When you are finished reviewing your medications, please sign one of the following statements.   I have reviewed the list of medicines above and they are correct,  Signature: ___________________________________________                                                  OR  I have reviewed the list of medicines and corrected any errors,  Signature: ___________________________________________     Thank you

## 2010-06-01 ENCOUNTER — Encounter (HOSPITAL_COMMUNITY): Payer: 59 | Admitting: Psychiatry

## 2010-06-08 LAB — BLOOD GAS, ARTERIAL
Bicarbonate: 23.3 mEq/L (ref 20.0–24.0)
O2 Saturation: 96.1 %
Patient temperature: 37
pH, Arterial: 7.423 (ref 7.350–7.450)

## 2010-06-13 ENCOUNTER — Encounter (INDEPENDENT_AMBULATORY_CARE_PROVIDER_SITE_OTHER): Payer: 59 | Admitting: Psychiatry

## 2010-06-13 DIAGNOSIS — F411 Generalized anxiety disorder: Secondary | ICD-10-CM

## 2010-06-13 DIAGNOSIS — F332 Major depressive disorder, recurrent severe without psychotic features: Secondary | ICD-10-CM

## 2010-06-16 ENCOUNTER — Other Ambulatory Visit: Payer: Self-pay

## 2010-06-16 MED ORDER — OXYCODONE-ACETAMINOPHEN 10-325 MG PO TABS: 1.0000 | ORAL_TABLET | Freq: Four times a day (QID) | ORAL | Status: DC

## 2010-06-16 MED ORDER — IPRATROPIUM-ALBUTEROL 0.5-2.5 (3) MG/3ML IN SOLN: 3.0000 mL | Freq: Three times a day (TID) | RESPIRATORY_TRACT | Status: DC

## 2010-06-26 ENCOUNTER — Telehealth: Payer: Self-pay | Admitting: Family Medicine

## 2010-06-26 NOTE — Telephone Encounter (Signed)
RX available.

## 2010-06-27 ENCOUNTER — Encounter (INDEPENDENT_AMBULATORY_CARE_PROVIDER_SITE_OTHER): Payer: Medicare Other | Admitting: Psychiatry

## 2010-06-27 DIAGNOSIS — F332 Major depressive disorder, recurrent severe without psychotic features: Secondary | ICD-10-CM

## 2010-06-27 DIAGNOSIS — F411 Generalized anxiety disorder: Secondary | ICD-10-CM

## 2010-07-06 ENCOUNTER — Other Ambulatory Visit: Payer: Self-pay

## 2010-07-06 MED ORDER — PROAIR HFA 108 (90 BASE) MCG/ACT IN AERS: 2.0000 | INHALATION_SPRAY | Freq: Four times a day (QID) | RESPIRATORY_TRACT | Status: DC | PRN

## 2010-07-11 ENCOUNTER — Encounter (INDEPENDENT_AMBULATORY_CARE_PROVIDER_SITE_OTHER): Payer: 59 | Admitting: Psychiatry

## 2010-07-11 ENCOUNTER — Telehealth: Payer: Self-pay

## 2010-07-11 DIAGNOSIS — F39 Unspecified mood [affective] disorder: Secondary | ICD-10-CM

## 2010-07-11 NOTE — Group Therapy Note (Signed)
NAME:  LUTHER, SPRINGS NO.:  [id number]   MEDICAL RECORD NO.:  [id number]          PATIENT TYPE:  INP   LOCATION:  A338                          FACILITY:  [facility]   PHYSICIAN:  Lucita Ferrara, MD         DATE OF BIRTH:  04-Jun-1965   DATE OF PROCEDURE:  DATE OF DISCHARGE:                                 PROGRESS NOTE   REFERRING PHYSICIAN:  [facility].   The patient examined by bedside today, he still has diminished senses in  his hands, he complains of pain.  He was told by Dr. Hilda Lias that a hand  specialist has been consulted.   Temperature 98.7.  Pulse 79.  Respirations 16.  Blood pressure 104/66.  Pulse ox 93% on room air.  HEENT:  Normocephalic, atraumatic.  CARDIOVASCULAR:  S1-S2.  LUNGS:  Clear to auscultation.  ABDOMEN:  Soft, nontender.  No signs of compartment syndrome.  He has  global lack of sensation to his fingers on the left side.  Median nerve examination is intact.  Still fully difficult to assess  radial nerve.  Patient is currently continued to be followed up by Dr.  Hilda Lias.   ASSESSMENT/PLAN:  As above.  Will continue neurovascular checks.  Continue per recommendations Dr. Hilda Lias, orthopedic surgery.  The  patient will likely need hand specialist.  His labs are normal, CBC.  He  has got some leukocytosis 17.8.  Hemoglobin dropped again to 8.4.  He  was transfused yesterday.  Currently he is on no antibiotics.  We will  go ahead and empirically cover him with vancomycin and Zosyn, monitor  his hemoglobin. I will call [facility] for transfer for hand-specialist.   Note Orthopedics is on this case, and furthur plans per ortho  reccomendations.      Lucita Ferrara, MD  Electronically Signed     RR/MEDQ  D:  08/11/2007  T:  08/11/2007  Job:  [id number]
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
## 2010-07-11 NOTE — Group Therapy Note (Signed)
NAME:  Gavin Gray, Gavin Gray NO.:  [id number]   MEDICAL RECORD NO.:  [id number]          PATIENT TYPE:  INP   LOCATION:  A338                          FACILITY:  [facility]   PHYSICIAN:  Lucita Ferrara, MD         DATE OF BIRTH:  June 10, 1965   DATE OF PROCEDURE:  08/13/2007  DATE OF DISCHARGE:                                 PROGRESS NOTE   ADDENDUM:  With regard to finalize disposition once the patient is  stable, I contacted the Hand Surgery- at the [facility].  Finally, I got a hold  of hand surgeon who called me back.  After having discussions with him in regards to the patient's hospital  course with presentation explaining that he had possible nerve damage.  He accepted the patient, not as a direct transfer, but will follow up as  an outpatient.  He stated that he is not going to be around next week  but will take care of the patient in the clinic.  He also told me that  he will call back with a specific appointment for the patient.  We will  attempt to call [facility] now  for transfer.      Lucita Ferrara, MD  Electronically Signed     RR/MEDQ  D:  08/13/2007  T:  08/13/2007  Job:  [id number]
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
## 2010-07-11 NOTE — Procedures (Signed)
NAME:  VERNEL, LANGENDERFER NO.:  [id number]   MEDICAL RECORD NO.:  [id number]          PATIENT TYPE:  OUT   LOCATION:  RESP                          FACILITY:  [facility]   PHYSICIAN:  Edward L. Juanetta Gosling, M.D.DATE OF BIRTH:  07-26-1965   DATE OF PROCEDURE:  DATE OF DISCHARGE:  05/06/2008                            PULMONARY FUNCTION TEST   1. Spirometry shows a mild ventilatory defect with evidence of airflow      obstruction, most marked in the smaller airways.  2. Lung volumes are normal.  3. DLCO is mildly reduced.  4. Arterial blood gases are normal.  5. This study is consistent with COPD.      Edward L. Juanetta Gosling, M.D.  Electronically Signed     ELH/MEDQ  D:  05/08/2008  T:  05/08/2008  Job:  [id number]   cc:   Franchot Heidelberg, M.D.

## 2010-07-11 NOTE — Discharge Summary (Signed)
NAME:  Gavin Gray, Gavin Gray NO.:  [id number]   MEDICAL RECORD NO.:  [id number]          PATIENT TYPE:  INP   LOCATION:  6742                         FACILITY:  [facility]   PHYSICIAN:  Beckey Rutter, MD  DATE OF BIRTH:  Dec 18, 1965   DATE OF ADMISSION:  08/13/2007  DATE OF DISCHARGE:                               DISCHARGE SUMMARY   PRIMARY CARE PHYSICIAN:  This patient is unassigned to [facility].  He  was transferred from [facility] on admission.   HOSPITAL COURSE AND DIAGNOSIS:  1. Please refer to the previously dictated discharge summary by Dr.      Midge Minium.  Recently, the patient has been improving on      intravenous vancomycin and Zosyn.  The recommendation is to      continue the 5-day antibiotic which is vancomycin and Unasyn      currently for 14 days and then to start Augmentin for 2 weeks      afterwards.  The patient will be discharged today with right upper      extremity PICC line with home health nursing care for the wound      care as well as for the intravenous medication.  Prescription was      given.  2. Renal failure.  Slowly improving, felt secondary to rhabdomyolysis      versus transient hypovolemia/hypotension.  The patient's creatinine      today is 1.8 and white blood count is 11.8.  He is stable for      discharge to continue on the regime as discussed with him.  The      patient agreed to follow up with his primary physician within a few      more days, 3-4 days presumably to further evaluate his renal      function.   DISCHARGE MEDICATIONS:  1. Unasyn 1.5 grams IV q. 8 h. for 3 more days.  2. Vancomycin 2000 mg IV q. 24 h. for 4 more days.  3. Cogentin 0.5 mg p.o. at bedtime.  4. Haldol 5 mg p.o. at bedtime  5. Clonidine 0.2 mg p.o. q.8 h.  6. Colace 100 mg p.o. at bedtime.  7. Percocet 2 tablets p.o. q.6 h. p.r.n.  8. Inderal 20 mg p.o. 3 times a day.  9. Multivitamins.  10.Flora-Q 1 tablet daily.  11.Augmentin 875 mg p.o.  b.i.d. for 14 days to start after finishing      the intravenous Zosyn and vancomycin.   DISCHARGE PLAN:  The patient is aware and agreeable to the discharge  plan.  The importance of followup with his primary physician within 5  days to check his renal function as well as the evaluation for his wound  was stressed to him.  He is aware and agreeable to the discharge plan.  Home health visiting nurse with discharge is requested.      Beckey Rutter, MD  Electronically Signed     EME/MEDQ  D:  08/24/2007  T:  08/24/2007  Job:  [id number]

## 2010-07-11 NOTE — Op Note (Signed)
NAME:  Gavin Gray, Gavin Gray NO.:  [id number]   MEDICAL RECORD NO.:  [id number]          PATIENT TYPE:  INP   LOCATION:  3314                         FACILITY:  [facility]   PHYSICIAN:  Artist Pais. Weingold, M.D.DATE OF BIRTH:  Jun 29, 1965   DATE OF PROCEDURE:  08/15/2007  DATE OF DISCHARGE:                               OPERATIVE REPORT   PREOPERATIVE DIAGNOSIS:  Deep laceration, left forearm.   POSTOPERATIVE DIAGNOSIS:  Deep laceration, left forearm.   PROCEDURE:  Repeat I&D, extensive debridement of nonviable muscle, and  microscopic repair of ulnar nerve and median nerve using a 6-mm  Neuroflex tube for the ulnar and a 4-mm Neuroflex tube for the median  nerve.   SURGEON:  Artist Pais. Mina Marble, MD   ASSISTANT:  Vanita Panda. Magnus Ivan, MD   ANESTHESIA:  General.   TOURNIQUET TIME:  1 hour and 35 minutes.   COMPLICATIONS:  None.   DRAINS:  None.   OPERATIVE REPORT:  The patient was taken to the operating suite.  After  induction of adequate general anesthesia, the left upper extremity was  prepped and draped in usual sterile fashion.  This was done after  removal of the wound VAC being placed previously.  At this point in  time, with the tourniquet deflated, entire debridement was undertaken  including 6 L of normal saline under pulse lavage.  The tourniquet was  then raised and a nonviable muscle was debrided both prior and after the  tourniquet had been raised.  After thorough debridement had been  undertaken and viable muscle was ascertained using the Bovie to look for  contractility, the ulnar nerve was repaired microscopically using a 6-mm  Neuroflex tube and 7-0 nylon followed by repair of the median nerve  using the 4-mm Neuroflex tube and 7-0 nylon.  At the end of the  procedure, the wound was irrigated, hemostasis was achieved with bipolar  cautery, and loosely closed with staples.  Xeroform, 4 x 4s, and  compressive dressing and dorsal extension block  splint was applied with  the wrist flexed.  The patient tolerated the procedure well and went to  recovery room in stable fashion.      Artist Pais Mina Marble, M.D.  Electronically Signed     MAW/MEDQ  D:  08/15/2007  T:  08/16/2007  Job:  [id number]
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
## 2010-07-13 ENCOUNTER — Encounter (HOSPITAL_COMMUNITY): Payer: 59 | Admitting: Psychiatry

## 2010-07-14 NOTE — H&P (Signed)
[facility]  Patient:    Gavin Gray, Gavin Gray                      MRN: [id number] Adm. Date:  [id number] Disc. Date: [id number] Attending:  Geoffery Lyons A Dictator:   Candi Leash. Orsini, N.P.                   Psychiatric Admission Assessment  CONTINUATION  MENTAL STATUS EXAMINATION:  Speech is soft-spoken and relevant.  Mood is anxious.  Affect is anxious and constricted.  Patient is combing his hair through his hands.  He is very fidgety in his seat.  Thought processes are coherent.  There is no evidence of psychosis.  No auditory or visual hallucinations.  No suicidal ideation.  Positive homicidal ideation. Questionable paranoia.  Patient is suspicious.  No flight of ideas.  Cognitive function is intact.  Memory is fair.  Judgment is poor.  Insight is poor. Poor impulse control.  DIAGNOSES: Axis I:    Bipolar disorder, manic. Axis II:   Deferred. Axis III:  1. Hypertension.            2. Back pain. Axis IV:   Moderate to severe (problems with primary support group, economics            and other psychosocial problems related to his psychiatric illness,            noncompliance). Axis V:    Current 25; estimated this past year 55-60.  PLAN:  Voluntary admission to [facility] for depression, agitation and patient decompensating.  Contract for safety.  Check every 15 minutes.  Will initiate Zyprexa for sleep.  Consult with Dr. Dub Mikes for medications.  Will have a family session.  Will monitor his blood pressures. We will watch patients response to medication and consider one-to-one if patient become agitated.  Our goal is to return patient to prior living arrangement, to stabilize mood and thinking so patient and others can be safe, for patient to be medication-compliant, to follow up with [facility] after discharge.  TENTATIVE LENGTH OF STAY:  Four to five days. DD:  09/26/00 TD:  09/28/00 Job: [id number]
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
## 2010-07-16 NOTE — Telephone Encounter (Signed)
Will add "new med" at next ov, pls let him know, and he needs to sched an appt if he does not have one in the next 4 to 6 weeks(not an emergency)

## 2010-07-17 ENCOUNTER — Other Ambulatory Visit: Payer: Self-pay

## 2010-07-17 ENCOUNTER — Other Ambulatory Visit: Payer: Self-pay | Admitting: Family Medicine

## 2010-07-17 ENCOUNTER — Encounter (HOSPITAL_COMMUNITY): Payer: 59 | Admitting: Psychiatry

## 2010-07-17 MED ORDER — LIDOCAINE 5 % EX PTCH: 1.0000 | MEDICATED_PATCH | CUTANEOUS | Status: DC

## 2010-07-17 NOTE — Telephone Encounter (Signed)
I had told you that it was last filled 05/2009 but you wrote it for a 90 day supply with 4 refills which has lasted him up til now. The pharmacist said it isn't a new med. He has been taking it this whole time [facility]

## 2010-07-17 NOTE — Telephone Encounter (Signed)
Refill pls for n additional 6 months

## 2010-07-18 ENCOUNTER — Encounter (HOSPITAL_COMMUNITY): Payer: 59 | Admitting: Psychiatry

## 2010-07-19 NOTE — Telephone Encounter (Signed)
refilled 

## 2010-07-20 ENCOUNTER — Encounter (HOSPITAL_COMMUNITY): Payer: 59 | Admitting: Psychiatry

## 2010-07-21 ENCOUNTER — Other Ambulatory Visit: Payer: Self-pay

## 2010-07-21 MED ORDER — OXYCODONE-ACETAMINOPHEN 10-325 MG PO TABS: 1.0000 | ORAL_TABLET | Freq: Four times a day (QID) | ORAL | Status: DC

## 2010-08-18 ENCOUNTER — Encounter (INDEPENDENT_AMBULATORY_CARE_PROVIDER_SITE_OTHER): Payer: 59 | Admitting: Psychiatry

## 2010-08-18 ENCOUNTER — Other Ambulatory Visit: Payer: Self-pay

## 2010-08-18 DIAGNOSIS — F411 Generalized anxiety disorder: Secondary | ICD-10-CM

## 2010-08-18 DIAGNOSIS — F332 Major depressive disorder, recurrent severe without psychotic features: Secondary | ICD-10-CM

## 2010-08-18 MED ORDER — OXYCODONE-ACETAMINOPHEN 10-325 MG PO TABS
1.0000 | ORAL_TABLET | Freq: Four times a day (QID) | ORAL | Status: DC
Start: 2010-08-18 — End: 2010-09-15

## 2010-09-01 ENCOUNTER — Encounter (INDEPENDENT_AMBULATORY_CARE_PROVIDER_SITE_OTHER): Payer: 59 | Admitting: Psychiatry

## 2010-09-01 DIAGNOSIS — F411 Generalized anxiety disorder: Secondary | ICD-10-CM

## 2010-09-01 DIAGNOSIS — F332 Major depressive disorder, recurrent severe without psychotic features: Secondary | ICD-10-CM

## 2010-09-11 ENCOUNTER — Encounter: Payer: Self-pay | Admitting: Family Medicine

## 2010-09-12 ENCOUNTER — Encounter (HOSPITAL_COMMUNITY): Payer: 59 | Admitting: Psychiatry

## 2010-09-15 ENCOUNTER — Other Ambulatory Visit: Payer: Self-pay

## 2010-09-15 MED ORDER — OXYCODONE-ACETAMINOPHEN 10-325 MG PO TABS: 1.0000 | ORAL_TABLET | Freq: Four times a day (QID) | ORAL | Status: DC

## 2010-09-19 ENCOUNTER — Ambulatory Visit (INDEPENDENT_AMBULATORY_CARE_PROVIDER_SITE_OTHER): Payer: 59 | Admitting: Family Medicine

## 2010-09-19 ENCOUNTER — Encounter: Payer: Self-pay | Admitting: Family Medicine

## 2010-09-19 VITALS — BP 98/68 | HR 79 | Resp 16 | Ht 73.0 in | Wt 329.1 lb

## 2010-09-19 DIAGNOSIS — M79609 Pain in unspecified limb: Secondary | ICD-10-CM

## 2010-09-19 DIAGNOSIS — R5383 Other fatigue: Secondary | ICD-10-CM

## 2010-09-19 DIAGNOSIS — E785 Hyperlipidemia, unspecified: Secondary | ICD-10-CM

## 2010-09-19 DIAGNOSIS — R5381 Other malaise: Secondary | ICD-10-CM

## 2010-09-19 DIAGNOSIS — R7301 Impaired fasting glucose: Secondary | ICD-10-CM

## 2010-09-19 DIAGNOSIS — I10 Essential (primary) hypertension: Secondary | ICD-10-CM

## 2010-09-19 DIAGNOSIS — F319 Bipolar disorder, unspecified: Secondary | ICD-10-CM

## 2010-09-19 DIAGNOSIS — J449 Chronic obstructive pulmonary disease, unspecified: Secondary | ICD-10-CM

## 2010-09-19 DIAGNOSIS — R7303 Prediabetes: Secondary | ICD-10-CM

## 2010-09-19 DIAGNOSIS — E669 Obesity, unspecified: Secondary | ICD-10-CM

## 2010-09-19 DIAGNOSIS — F17200 Nicotine dependence, unspecified, uncomplicated: Secondary | ICD-10-CM

## 2010-09-19 DIAGNOSIS — R7309 Other abnormal glucose: Secondary | ICD-10-CM

## 2010-09-19 MED ORDER — HYDROCODONE-ACETAMINOPHEN 10-500 MG PO TABS: 1.0000 | ORAL_TABLET | Freq: Four times a day (QID) | ORAL | Status: AC | PRN

## 2010-09-19 MED ORDER — HYDROCODONE-ACETAMINOPHEN 10-500 MG PO TABS: 1.0000 | ORAL_TABLET | Freq: Four times a day (QID) | ORAL | Status: DC | PRN

## 2010-09-19 NOTE — Patient Instructions (Signed)
F/u in 3 months.  pls cut back on cigarettes, you need to quit.  You will be referred for individual weight loss counselling.  Labs today  It is important that you exercise regularly at least 30 minutes 5 times a week. If you develop chest pain, have severe difficulty breathing, or feel very tired, stop exercising immediately and seek medical attention    A healthy diet is rich in fruit, vegetables and whole grains. Poultry fish, nuts and beans are a healthy choice for protein rather then red meat. A low sodium diet and drinking 64 ounces of water daily is generally recommended. Oils and sweet should be limited. Carbohydrates especially for those who are diabetic or overweight, should be limited to 34-45 gram per meal. It is important to eat on a regular schedule, at least 3 times daily. Snacks should be primarily fruits, vegetables or nuts.

## 2010-09-22 ENCOUNTER — Encounter (INDEPENDENT_AMBULATORY_CARE_PROVIDER_SITE_OTHER): Payer: 59 | Admitting: Psychiatry

## 2010-09-22 DIAGNOSIS — F332 Major depressive disorder, recurrent severe without psychotic features: Secondary | ICD-10-CM

## 2010-09-22 DIAGNOSIS — F411 Generalized anxiety disorder: Secondary | ICD-10-CM

## 2010-10-03 ENCOUNTER — Encounter (INDEPENDENT_AMBULATORY_CARE_PROVIDER_SITE_OTHER): Payer: 59 | Admitting: Psychiatry

## 2010-10-03 DIAGNOSIS — F39 Unspecified mood [affective] disorder: Secondary | ICD-10-CM

## 2010-10-04 ENCOUNTER — Other Ambulatory Visit: Payer: Self-pay | Admitting: Family Medicine

## 2010-10-04 LAB — BASIC METABOLIC PANEL
BUN: 21 mg/dL (ref 6–23)
CO2: 28 mEq/L (ref 19–32)
Chloride: 102 mEq/L (ref 96–112)
Creat: 1.44 mg/dL — ABNORMAL HIGH (ref 0.50–1.35)
Glucose, Bld: 72 mg/dL (ref 70–99)
Potassium: 4.3 mEq/L (ref 3.5–5.3)

## 2010-10-04 LAB — CBC WITH DIFFERENTIAL/PLATELET
Basophils Absolute: 0 10*3/uL (ref 0.0–0.1)
Basophils Relative: 0 % (ref 0–1)
Eosinophils Absolute: 0.8 10*3/uL — ABNORMAL HIGH (ref 0.0–0.7)
HCT: 44.2 % (ref 39.0–52.0)
Hemoglobin: 14.3 g/dL (ref 13.0–17.0)
MCH: 31.8 pg (ref 26.0–34.0)
MCHC: 32.4 g/dL (ref 30.0–36.0)
Monocytes Absolute: 0.8 10*3/uL (ref 0.1–1.0)
Monocytes Relative: 9 % (ref 3–12)
Neutro Abs: 5.1 10*3/uL (ref 1.7–7.7)
Neutrophils Relative %: 55 % (ref 43–77)
RDW: 14.7 % (ref 11.5–15.5)

## 2010-10-04 LAB — HEPATIC FUNCTION PANEL
Alkaline Phosphatase: 81 U/L (ref 39–117)
Bilirubin, Direct: 0.1 mg/dL (ref 0.0–0.3)
Indirect Bilirubin: 0.3 mg/dL (ref 0.0–0.9)
Total Bilirubin: 0.4 mg/dL (ref 0.3–1.2)

## 2010-10-04 LAB — LIPID PANEL: Triglycerides: 419 mg/dL — ABNORMAL HIGH (ref <150)

## 2010-10-04 LAB — HEMOGLOBIN A1C: Mean Plasma Glucose: 123 mg/dL — ABNORMAL HIGH (ref <117)

## 2010-10-06 ENCOUNTER — Encounter (INDEPENDENT_AMBULATORY_CARE_PROVIDER_SITE_OTHER): Payer: 59 | Admitting: Psychiatry

## 2010-10-06 DIAGNOSIS — F411 Generalized anxiety disorder: Secondary | ICD-10-CM

## 2010-10-06 DIAGNOSIS — F332 Major depressive disorder, recurrent severe without psychotic features: Secondary | ICD-10-CM

## 2010-10-08 DIAGNOSIS — R7303 Prediabetes: Secondary | ICD-10-CM | POA: Insufficient documentation

## 2010-10-08 NOTE — Assessment & Plan Note (Signed)
Deteriorated. Patient re-educated about  the importance of commitment to a  minimum of 150 minutes of exercise per week. The importance of healthy food choices with portion control discussed. Encouraged to start a food diary, count calories and to consider  joining a support group. Sample diet sheets offered. Goals set by the patient for the next several months.    

## 2010-10-08 NOTE — Assessment & Plan Note (Signed)
Worsening due to ongoing nicotine use  

## 2010-10-08 NOTE — Progress Notes (Signed)
  Subjective:    Patient ID: Gavin Gray, male    DOB: 1965/08/24, 45 y.o.   MRN: [id number]  HPI The PT is here for follow up and re-evaluation of chronic medical conditions, medication management and review of any available recent lab and radiology data.  Preventive health is updated, specifically  Cancer screening and Immunization.   Questions or concerns regarding consultations or procedures which the PT has had in the interim are  addressed. The PT denies any adverse reactions to current medications since the last visit.  There are no new concerns.  There are no specific complaint sexcept that he c/o increased arm pain       Review of Systems Denies recent fever or chills. Denies sinus pressure, nasal congestion, ear pain or sore throat. Denies chest congestion, productive cough or wheezing. Denies chest pains, palpitations and leg swelling Denies abdominal pain, nausea, vomiting,diarrhea or constipation.   Denies dysuria, frequency, hesitancy or incontinence. Denies headaches, seizures, numbness, or tingling. Denies uncontrolled  depression, anxiety or insomnia.Sees psychiatry regularly Denies skin break down or rash.        Objective:   Physical Exam Patient alert and oriented and in no cardiopulmonary distress.  HEENT: No facial asymmetry, EOMI, no sinus tenderness,  oropharynx pink and moist.  Neck supple no adenopathy.  Chest: Clear to auscultation bilaterally Decreased air entry  throughout  CVS: S1, S2 no murmurs, no S3.  ABD: Soft non tender. Bowel sounds normal.  Ext: No edema  MS: decreased  ROM spine, shoulders, hips and knees.  Skin: Intact, no ulcerations or rash noted.  Psych: Good eye contact, flatl affect. Memory mildly impaired, mildly anxious and  depressed appearing.  CNS: CN 2-12 intact, power, tone and sensation normal throughout.        Assessment & Plan:

## 2010-10-08 NOTE — Assessment & Plan Note (Signed)
Unchanged, the significance of this dx and the need to lose weight and change lifestyle to delay or abort diabetes was explained in detail, pt referred for individual counselling

## 2010-10-08 NOTE — Assessment & Plan Note (Signed)
Deteriorated, pt to try harder to quit

## 2010-10-08 NOTE — Assessment & Plan Note (Signed)
Controlled, no change in medication  

## 2010-10-08 NOTE — Assessment & Plan Note (Signed)
Uncontrolled. Medication compliance addressed. Commitment to regular exercise, and healthy  eating habits with portion control discussed. DASH diet, and low fat diet discussed, and literature offered. No changes in medication at this time.  

## 2010-10-08 NOTE — Assessment & Plan Note (Signed)
Stable and followed by pyschiatry who has him on propranolol 20mg  4 times daily and is requesting that i refill the med since blood pressure and heart rate are not measured in psych

## 2010-10-11 ENCOUNTER — Other Ambulatory Visit: Payer: Self-pay

## 2010-10-11 MED ORDER — AMLODIPINE BESY-BENAZEPRIL HCL 10-40 MG PO CAPS: ORAL_CAPSULE | ORAL | Status: DC

## 2010-10-11 MED ORDER — PROAIR HFA 108 (90 BASE) MCG/ACT IN AERS: 2.0000 | INHALATION_SPRAY | Freq: Four times a day (QID) | RESPIRATORY_TRACT | Status: DC | PRN

## 2010-10-11 MED ORDER — CYCLOBENZAPRINE HCL 10 MG PO TABS: ORAL_TABLET | ORAL | Status: DC

## 2010-10-13 ENCOUNTER — Other Ambulatory Visit: Payer: Self-pay

## 2010-10-13 MED ORDER — GABAPENTIN 400 MG PO CAPS: ORAL_CAPSULE | ORAL | Status: DC

## 2010-10-20 ENCOUNTER — Encounter (INDEPENDENT_AMBULATORY_CARE_PROVIDER_SITE_OTHER): Payer: 59 | Admitting: Psychiatry

## 2010-10-20 DIAGNOSIS — F332 Major depressive disorder, recurrent severe without psychotic features: Secondary | ICD-10-CM

## 2010-10-20 DIAGNOSIS — F411 Generalized anxiety disorder: Secondary | ICD-10-CM

## 2010-10-26 ENCOUNTER — Encounter (HOSPITAL_COMMUNITY): Payer: 59 | Admitting: Psychiatry

## 2010-11-09 ENCOUNTER — Encounter (INDEPENDENT_AMBULATORY_CARE_PROVIDER_SITE_OTHER): Payer: 59 | Admitting: Psychiatry

## 2010-11-09 DIAGNOSIS — F411 Generalized anxiety disorder: Secondary | ICD-10-CM

## 2010-11-09 DIAGNOSIS — F332 Major depressive disorder, recurrent severe without psychotic features: Secondary | ICD-10-CM

## 2010-11-16 ENCOUNTER — Other Ambulatory Visit: Payer: Self-pay | Admitting: Family Medicine

## 2010-11-17 ENCOUNTER — Encounter (INDEPENDENT_AMBULATORY_CARE_PROVIDER_SITE_OTHER): Payer: Medicare Other | Admitting: Psychiatry

## 2010-11-17 ENCOUNTER — Telehealth: Payer: Self-pay

## 2010-11-17 DIAGNOSIS — F411 Generalized anxiety disorder: Secondary | ICD-10-CM

## 2010-11-17 DIAGNOSIS — F332 Major depressive disorder, recurrent severe without psychotic features: Secondary | ICD-10-CM

## 2010-11-17 NOTE — Telephone Encounter (Signed)
Patient stopped by the office and said that he has requested a decrease on his pain medicine but it isn't helping and wants to know if you can increase it back to the oxycodone the next time its due?

## 2010-11-19 NOTE — Telephone Encounter (Signed)
Needs ov before change in dose made, he needs to schedule one around time the med refill is due,let him know

## 2010-11-20 ENCOUNTER — Encounter: Payer: Self-pay | Admitting: Family Medicine

## 2010-11-20 ENCOUNTER — Telehealth: Payer: Self-pay | Admitting: Family Medicine

## 2010-11-20 NOTE — Telephone Encounter (Signed)
Patient aware and sent to [facility] for appt

## 2010-11-20 NOTE — Telephone Encounter (Signed)
I already spoke with patient this am and he is scheduling an appt

## 2010-11-20 NOTE — Telephone Encounter (Signed)
Nurse pls return pt's call, I sent the msg already

## 2010-11-21 ENCOUNTER — Ambulatory Visit (INDEPENDENT_AMBULATORY_CARE_PROVIDER_SITE_OTHER): Payer: Medicare Other | Admitting: Family Medicine

## 2010-11-21 ENCOUNTER — Encounter: Payer: Self-pay | Admitting: Family Medicine

## 2010-11-21 VITALS — BP 110/82 | HR 84 | Resp 16 | Ht 72.0 in | Wt 340.1 lb

## 2010-11-21 DIAGNOSIS — F319 Bipolar disorder, unspecified: Secondary | ICD-10-CM

## 2010-11-21 DIAGNOSIS — K219 Gastro-esophageal reflux disease without esophagitis: Secondary | ICD-10-CM

## 2010-11-21 DIAGNOSIS — R5383 Other fatigue: Secondary | ICD-10-CM

## 2010-11-21 DIAGNOSIS — R5381 Other malaise: Secondary | ICD-10-CM

## 2010-11-21 DIAGNOSIS — Z125 Encounter for screening for malignant neoplasm of prostate: Secondary | ICD-10-CM

## 2010-11-21 DIAGNOSIS — I10 Essential (primary) hypertension: Secondary | ICD-10-CM

## 2010-11-21 DIAGNOSIS — J4489 Other specified chronic obstructive pulmonary disease: Secondary | ICD-10-CM

## 2010-11-21 DIAGNOSIS — F209 Schizophrenia, unspecified: Secondary | ICD-10-CM

## 2010-11-21 DIAGNOSIS — J449 Chronic obstructive pulmonary disease, unspecified: Secondary | ICD-10-CM

## 2010-11-21 DIAGNOSIS — F17200 Nicotine dependence, unspecified, uncomplicated: Secondary | ICD-10-CM

## 2010-11-21 DIAGNOSIS — M24549 Contracture, unspecified hand: Secondary | ICD-10-CM

## 2010-11-21 DIAGNOSIS — E785 Hyperlipidemia, unspecified: Secondary | ICD-10-CM

## 2010-11-21 DIAGNOSIS — Z23 Encounter for immunization: Secondary | ICD-10-CM

## 2010-11-21 MED ORDER — OXYCODONE-ACETAMINOPHEN 10-325 MG PO TABS: 1.0000 | ORAL_TABLET | Freq: Four times a day (QID) | ORAL | Status: DC | PRN

## 2010-11-21 MED ORDER — INFLUENZA VAC TYPES A & B PF IM SUSP: 0.5000 mL | Freq: Once | INTRAMUSCULAR | Status: DC
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
## 2010-11-21 NOTE — Patient Instructions (Addendum)
CPE in December.Cancel October appt  Your pain medication will be changed back to what you were previously on.  Flu vaccine today.  Fasting labs in December approx 1 week before next visit.  I hope you feel better.  I will order a shower chair and also a hand held shower spigot   Take one baby aspirin half an hour before the niaspan with applesauce , this should reduce the flushing  Ok to go to gym as discussed

## 2010-11-23 LAB — DIFFERENTIAL
Basophils Absolute: 0
Basophils Absolute: 0.1
Basophils Relative: 0
Basophils Relative: 1
Basophils Relative: 0
Basophils Relative: 0
Basophils Relative: 1
Eosinophils Absolute: 0.4
Eosinophils Absolute: 0.2
Eosinophils Absolute: 0.4
Eosinophils Absolute: 0.5
Eosinophils Relative: 0
Eosinophils Relative: 3
Eosinophils Relative: 5
Lymphocytes Relative: 16
Lymphs Abs: 2.9
Lymphs Abs: 3.1
Lymphs Abs: 1.6
Lymphs Abs: 2.3
Monocytes Absolute: 0.6
Monocytes Absolute: 0.6
Monocytes Absolute: 0.9
Monocytes Relative: 4
Monocytes Relative: 6
Monocytes Relative: 6
Monocytes Relative: 6
Monocytes Relative: 7
Monocytes Relative: 9
Monocytes Relative: 7
Neutro Abs: 11.2 — ABNORMAL HIGH
Neutro Abs: 8.1 — ABNORMAL HIGH
Neutrophils Relative %: 75
Neutrophils Relative %: 77
Neutrophils Relative %: 69

## 2010-11-23 LAB — CBC
HCT: 31.1 — ABNORMAL LOW
HCT: 23.8 — ABNORMAL LOW
HCT: 22.8 — ABNORMAL LOW
HCT: 25.2 — ABNORMAL LOW
HCT: 25.6 — ABNORMAL LOW
HCT: 25.9 — ABNORMAL LOW
HCT: 26 — ABNORMAL LOW
Hemoglobin: 11 — ABNORMAL LOW
Hemoglobin: 8.3 — ABNORMAL LOW
Hemoglobin: 8.5 — ABNORMAL LOW
Hemoglobin: 8.8 — ABNORMAL LOW
Hemoglobin: 8.7 — ABNORMAL LOW
Hemoglobin: 8.7 — ABNORMAL LOW
Hemoglobin: 8.9 — ABNORMAL LOW
Hemoglobin: 8.9 — ABNORMAL LOW
Hemoglobin: 9 — ABNORMAL LOW
MCHC: 35.3
MCHC: 35.8
MCHC: 35.4
MCHC: 33.6
MCHC: 33.6
MCHC: 34.2
MCHC: 34.6
MCHC: 34.7
MCV: 93.4
MCV: 93.5
MCV: 93.8
MCV: 91.4
MCV: 92.6
MCV: 92.6
MCV: 94.5
MCV: 94.5
MCV: 94.7
Platelets: 278
Platelets: 353
Platelets: 446 — ABNORMAL HIGH
Platelets: 521 — ABNORMAL HIGH
Platelets: 549 — ABNORMAL HIGH
Platelets: 720 — ABNORMAL HIGH
Platelets: 762 — ABNORMAL HIGH
Platelets: 792 — ABNORMAL HIGH
Platelets: 817 — ABNORMAL HIGH
RBC: 2.51 — ABNORMAL LOW
RBC: 3.33 — ABNORMAL LOW
RBC: 2.54 — ABNORMAL LOW
RBC: 2.57 — ABNORMAL LOW
RBC: 2.65 — ABNORMAL LOW
RBC: 2.73 — ABNORMAL LOW
RBC: 2.76 — ABNORMAL LOW
RBC: 2.86 — ABNORMAL LOW
RBC: 3.04 — ABNORMAL LOW
RDW: 15.6 — ABNORMAL HIGH
RDW: 15.7 — ABNORMAL HIGH
RDW: 15.5
RDW: 15.6 — ABNORMAL HIGH
RDW: 15.6 — ABNORMAL HIGH
RDW: 15.7 — ABNORMAL HIGH
RDW: 15.8 — ABNORMAL HIGH
RDW: 16.1 — ABNORMAL HIGH
WBC: 13.9 — ABNORMAL HIGH
WBC: 9
WBC: 16.1 — ABNORMAL HIGH
WBC: 11.8 — ABNORMAL HIGH
WBC: 15.2 — ABNORMAL HIGH
WBC: 16.2 — ABNORMAL HIGH
WBC: 17.2 — ABNORMAL HIGH
WBC: 17.3 — ABNORMAL HIGH
WBC: 18.6 — ABNORMAL HIGH
WBC: 26.6 — ABNORMAL HIGH

## 2010-11-23 LAB — COMPREHENSIVE METABOLIC PANEL
ALT: 40
ALT: 53
ALT: 62 — ABNORMAL HIGH
ALT: 49
ALT: 65 — ABNORMAL HIGH
AST: 15
AST: 26
AST: 30
AST: 41 — ABNORMAL HIGH
AST: 25
Albumin: 2.6 — ABNORMAL LOW
Albumin: 2.4 — ABNORMAL LOW
Albumin: 2.4 — ABNORMAL LOW
Albumin: 2.6 — ABNORMAL LOW
Alkaline Phosphatase: 75
Alkaline Phosphatase: 112
Alkaline Phosphatase: 114
Alkaline Phosphatase: 98
Alkaline Phosphatase: 128 — ABNORMAL HIGH
BUN: 12
BUN: 11
BUN: 2 — ABNORMAL LOW
CO2: 26
CO2: 28
CO2: 29
CO2: 26
CO2: 29
Calcium: 7.8 — ABNORMAL LOW
Calcium: 8.1 — ABNORMAL LOW
Calcium: 8.4
Calcium: 8.4
Chloride: 117 — ABNORMAL HIGH
Chloride: 101
Chloride: 101
Chloride: 102
Chloride: 102
Creatinine, Ser: 1.22
Creatinine, Ser: 0.87
Creatinine, Ser: 0.92
Creatinine, Ser: 1.07
GFR calc Af Amer: >60
GFR calc Af Amer: >60
GFR calc Af Amer: >60
GFR calc Af Amer: >60
GFR calc Af Amer: >60
GFR calc non Af Amer: >60
GFR calc non Af Amer: >60
GFR calc non Af Amer: >60
Glucose, Bld: 117 — ABNORMAL HIGH
Glucose, Bld: 101 — ABNORMAL HIGH
Glucose, Bld: 117 — ABNORMAL HIGH
Glucose, Bld: 117 — ABNORMAL HIGH
Glucose, Bld: 124 — ABNORMAL HIGH
Potassium: 4.1
Potassium: 3.3 — ABNORMAL LOW
Potassium: 3.4 — ABNORMAL LOW
Potassium: 3.6
Potassium: 3.4 — ABNORMAL LOW
Sodium: 145
Sodium: 136
Sodium: 137
Sodium: 137
Sodium: 138
Total Bilirubin: 0.5
Total Bilirubin: 0.5
Total Bilirubin: 0.8
Total Protein: 4.5 — ABNORMAL LOW
Total Protein: 5.1 — ABNORMAL LOW
Total Protein: 5.5 — ABNORMAL LOW
Total Protein: 5.5 — ABNORMAL LOW
Total Protein: 5.6 — ABNORMAL LOW

## 2010-11-23 LAB — HEMOGLOBIN AND HEMATOCRIT, BLOOD
HCT: 24.2 — ABNORMAL LOW
HCT: 26.1 — ABNORMAL LOW
Hemoglobin: 9.2 — ABNORMAL LOW

## 2010-11-23 LAB — RENAL FUNCTION PANEL
Albumin: 2.3 — ABNORMAL LOW
Albumin: 2.3 — ABNORMAL LOW
Albumin: 2.7 — ABNORMAL LOW
BUN: 7
BUN: 8
CO2: 24
CO2: 25
Calcium: 8.3 — ABNORMAL LOW
Chloride: 103
Chloride: 105
Chloride: 108
Creatinine, Ser: 2.08 — ABNORMAL HIGH
Creatinine, Ser: 2.15 — ABNORMAL HIGH
GFR calc Af Amer: 41 — ABNORMAL LOW
GFR calc Af Amer: 43 — ABNORMAL LOW
GFR calc non Af Amer: 34 — ABNORMAL LOW
GFR calc non Af Amer: 36 — ABNORMAL LOW
Glucose, Bld: 96
Glucose, Bld: 96
Phosphorus: 2.5
Phosphorus: 4.4
Potassium: 3.8
Potassium: 4
Potassium: 4.2
Sodium: 138
Sodium: 141

## 2010-11-23 LAB — IRON AND TIBC
Iron: 20 — ABNORMAL LOW
Saturation Ratios: 9 — ABNORMAL LOW
TIBC: 211 — ABNORMAL LOW
UIBC: 211

## 2010-11-23 LAB — URINALYSIS, ROUTINE W REFLEX MICROSCOPIC
Glucose, UA: NEGATIVE
Glucose, UA: NEGATIVE
Ketones, ur: NEGATIVE
Leukocytes, UA: NEGATIVE
Leukocytes, UA: NEGATIVE
Nitrite: NEGATIVE
Protein, ur: NEGATIVE
pH: 6
pH: 6

## 2010-11-23 LAB — ANAEROBIC CULTURE

## 2010-11-23 LAB — PREPARE RBC (CROSSMATCH)

## 2010-11-23 LAB — BASIC METABOLIC PANEL
BUN: 6
BUN: 7
CO2: 17 — ABNORMAL LOW
Calcium: 7.6 — ABNORMAL LOW
Calcium: 7.9 — ABNORMAL LOW
Calcium: 8.1 — ABNORMAL LOW
Calcium: 8.2 — ABNORMAL LOW
Chloride: 108
Chloride: 103
Chloride: 103
Creatinine, Ser: 1.85 — ABNORMAL HIGH
Creatinine, Ser: 1.92 — ABNORMAL HIGH
Creatinine, Ser: 1.95 — ABNORMAL HIGH
Creatinine, Ser: 2.08 — ABNORMAL HIGH
GFR calc Af Amer: 43 — ABNORMAL LOW
GFR calc Af Amer: 43 — ABNORMAL LOW
GFR calc Af Amer: 47 — ABNORMAL LOW
GFR calc Af Amer: 49 — ABNORMAL LOW
GFR calc non Af Amer: 32 — ABNORMAL LOW
GFR calc non Af Amer: 38 — ABNORMAL LOW
GFR calc non Af Amer: 39 — ABNORMAL LOW
Glucose, Bld: 110 — ABNORMAL HIGH
Potassium: 3.2 — ABNORMAL LOW
Potassium: 2.8 — ABNORMAL LOW
Potassium: 3.1 — ABNORMAL LOW
Sodium: 140
Sodium: 137
Sodium: 138
Sodium: 141

## 2010-11-23 LAB — TYPE AND SCREEN
ABO/RH(D): A POS
Antibody Screen: NEGATIVE
Antibody Screen: NEGATIVE

## 2010-11-23 LAB — CROSSMATCH
ABO/RH(D): A POS
Antibody Screen: NEGATIVE

## 2010-11-23 LAB — POCT I-STAT 4, (NA,K, GLUC, HGB,HCT)
Glucose, Bld: 126 — ABNORMAL HIGH
HCT: 25 — ABNORMAL LOW
Hemoglobin: 8.5 — ABNORMAL LOW
Operator id: 118551
Potassium: 3.4 — ABNORMAL LOW
Sodium: 137

## 2010-11-23 LAB — BLOOD GAS, ARTERIAL
Acid-Base Excess: 5.5 — ABNORMAL HIGH
Bicarbonate: 29.4 — ABNORMAL HIGH
O2 Saturation: 90.7
Patient temperature: 101.1
TCO2: 30.7
pH, Arterial: 7.432

## 2010-11-23 LAB — CK
Total CK: 627 — ABNORMAL HIGH
Total CK: 916 — ABNORMAL HIGH

## 2010-11-23 LAB — URINE MICROSCOPIC-ADD ON

## 2010-11-23 LAB — RETICULOCYTES
RBC.: 2.57 — ABNORMAL LOW
Retic Count, Absolute: 115.7
Retic Ct Pct: 4.5 — ABNORMAL HIGH

## 2010-11-23 LAB — CULTURE, BLOOD (ROUTINE X 2)
Culture: NO GROWTH
Culture: NO GROWTH
Culture: NO GROWTH
Report Status: 6202009

## 2010-11-23 LAB — CK TOTAL AND CKMB (NOT AT ARMC): Relative Index: 0.9

## 2010-11-23 LAB — GRAM STAIN

## 2010-11-23 LAB — WOUND CULTURE

## 2010-11-23 LAB — FERRITIN
Ferritin: 488 — ABNORMAL HIGH (ref 22–322)
Ferritin: 321 (ref 22–322)

## 2010-11-23 LAB — CALCIUM, IONIZED: Calcium, Ion: 1.19

## 2010-11-23 LAB — FOLATE
Folate: 8.1
Folate: 13.8

## 2010-11-23 LAB — MAGNESIUM: Magnesium: 2.4

## 2010-11-23 LAB — VITAMIN B12: Vitamin B-12: 198 — ABNORMAL LOW (ref 211–911)

## 2010-11-23 LAB — PHOSPHORUS: Phosphorus: 2.5

## 2010-11-23 LAB — ABO/RH: ABO/RH(D): A POS

## 2010-11-24 ENCOUNTER — Encounter (INDEPENDENT_AMBULATORY_CARE_PROVIDER_SITE_OTHER): Payer: Medicare Other | Admitting: Psychiatry

## 2010-11-24 DIAGNOSIS — F411 Generalized anxiety disorder: Secondary | ICD-10-CM

## 2010-11-24 DIAGNOSIS — F332 Major depressive disorder, recurrent severe without psychotic features: Secondary | ICD-10-CM

## 2010-11-26 NOTE — Assessment & Plan Note (Signed)
Currently not well controlled but receiving intense therapy and linked with psychiatry

## 2010-11-26 NOTE — Assessment & Plan Note (Signed)
Unchanged, with uncontrolled pain, med dose increased back to previous dose

## 2010-11-26 NOTE — Assessment & Plan Note (Signed)
Unchanged, counselled to quit 

## 2010-11-26 NOTE — Assessment & Plan Note (Signed)
Controlled, no change in medication  

## 2010-11-30 ENCOUNTER — Encounter (INDEPENDENT_AMBULATORY_CARE_PROVIDER_SITE_OTHER): Payer: Medicare Other | Admitting: Psychiatry

## 2010-11-30 DIAGNOSIS — F39 Unspecified mood [affective] disorder: Secondary | ICD-10-CM

## 2010-12-01 ENCOUNTER — Encounter (INDEPENDENT_AMBULATORY_CARE_PROVIDER_SITE_OTHER): Payer: Medicare Other | Admitting: Psychiatry

## 2010-12-01 DIAGNOSIS — F411 Generalized anxiety disorder: Secondary | ICD-10-CM

## 2010-12-01 DIAGNOSIS — F332 Major depressive disorder, recurrent severe without psychotic features: Secondary | ICD-10-CM

## 2010-12-08 ENCOUNTER — Encounter (INDEPENDENT_AMBULATORY_CARE_PROVIDER_SITE_OTHER): Payer: Medicare Other | Admitting: Psychiatry

## 2010-12-08 DIAGNOSIS — F411 Generalized anxiety disorder: Secondary | ICD-10-CM

## 2010-12-08 DIAGNOSIS — F332 Major depressive disorder, recurrent severe without psychotic features: Secondary | ICD-10-CM

## 2010-12-11 ENCOUNTER — Other Ambulatory Visit: Payer: Self-pay | Admitting: Family Medicine

## 2010-12-15 ENCOUNTER — Other Ambulatory Visit: Payer: Self-pay

## 2010-12-15 MED ORDER — OXYCODONE-ACETAMINOPHEN 10-325 MG PO TABS: 1.0000 | ORAL_TABLET | Freq: Four times a day (QID) | ORAL | Status: DC | PRN

## 2010-12-21 ENCOUNTER — Ambulatory Visit: Payer: 59 | Admitting: Family Medicine

## 2010-12-22 ENCOUNTER — Encounter (HOSPITAL_COMMUNITY): Payer: Medicare Other | Admitting: Psychiatry

## 2011-01-10 ENCOUNTER — Other Ambulatory Visit: Payer: Self-pay

## 2011-01-10 ENCOUNTER — Other Ambulatory Visit: Payer: Self-pay | Admitting: Family Medicine

## 2011-01-10 MED ORDER — AMLODIPINE BESY-BENAZEPRIL HCL 10-40 MG PO CAPS: ORAL_CAPSULE | ORAL | Status: DC

## 2011-01-10 MED ORDER — NIACIN ER (ANTIHYPERLIPIDEMIC) 500 MG PO TBCR: EXTENDED_RELEASE_TABLET | ORAL | Status: DC

## 2011-01-10 MED ORDER — PROAIR HFA 108 (90 BASE) MCG/ACT IN AERS: 2.0000 | INHALATION_SPRAY | Freq: Four times a day (QID) | RESPIRATORY_TRACT | Status: DC | PRN

## 2011-01-10 MED ORDER — CYCLOBENZAPRINE HCL 10 MG PO TABS: ORAL_TABLET | ORAL | Status: DC

## 2011-01-10 MED ORDER — ESOMEPRAZOLE MAGNESIUM 40 MG PO CPDR: DELAYED_RELEASE_CAPSULE | ORAL | Status: DC

## 2011-01-15 ENCOUNTER — Other Ambulatory Visit: Payer: Self-pay

## 2011-01-15 MED ORDER — OXYCODONE-ACETAMINOPHEN 10-325 MG PO TABS: 1.0000 | ORAL_TABLET | Freq: Four times a day (QID) | ORAL | Status: DC | PRN

## 2011-01-17 ENCOUNTER — Ambulatory Visit (INDEPENDENT_AMBULATORY_CARE_PROVIDER_SITE_OTHER): Payer: Medicare Other | Admitting: Psychiatry

## 2011-01-17 ENCOUNTER — Encounter (HOSPITAL_COMMUNITY): Payer: Self-pay | Admitting: Psychiatry

## 2011-01-17 DIAGNOSIS — F419 Anxiety disorder, unspecified: Secondary | ICD-10-CM

## 2011-01-17 DIAGNOSIS — F332 Major depressive disorder, recurrent severe without psychotic features: Secondary | ICD-10-CM

## 2011-01-17 DIAGNOSIS — F411 Generalized anxiety disorder: Secondary | ICD-10-CM

## 2011-01-17 NOTE — Progress Notes (Signed)
Patient:  KENTRAIL SHEW   DOB: 1965-05-09  MR Number: [id number]  Location: [facility]:  [address] [place] [place],  [place], [address]  Start: Wednesday 01/17/2011 1 PM End: Wednesday 01/17/2011 2 PM  Provider/Observer:     Florencia Reasons, MSW, LCSW   Chief Complaint:      Chief Complaint  Patient presents with  . Anxiety  . Depression    Reason For Service:     The patient initially was referred for services upon his discharge from the behavioral health hospital where he was treated for depression and suicidal ideations. Patient has a long-standing history of recurrent periods of depression and anxiety.  Interventions Strategy:  Supportive therapy, cognitive behavioral therapy  Participation Level:   Active  Participation Quality:  Appropriate      Behavioral Observation:  Fairly Groomed, Alert, and anxious  Current Psychosocial Factors: The patient continues to report stress associated with his adult children continuing to reside at his home. He also reported stress related to use girlfriend who is addicted to pain pills per patient's report. He reports additional stress related to his mother who has rapidly declining health.  Content of Session:   Reviewing symptoms, process and fillings, identify ways to set and maintain boundaries, reviewing coping and relaxation techniques  Current Status:   Patient reports anxiety, panic attacks, and auditory/visual hallucinations. He denies any command hallucinations.  Patient Progress:   Fair.  Patient reports continued anxiety but obtaining some relief when he visits his mother as he is away from home. He also reports less tension and conflict in his household but feeling overwhelmed. due to the number of people living in his home. He has set January 2013 as a deadline for his son and daughter-in-law to find their own place. Patient admits becoming irritable with his girlfriend and responding to her out of anger. Therapy is worse  with patient to identify ways to be assertive in his communication. Patient has improved self-care efforts regarding exercise as he now is going to the gym 3 days a week.   Impression/Diagnosis:   The patient has a long-standing history of recurrent periods of depression along with anxiety and panic attacks. Diagnoses: Maj. depressive disorder, anxiety disorder NOS  Diagnosis:  Axis I:  1. Major depressive disorder, recurrent episode, severe   2. Anxiety disorder             Axis II: Deferred

## 2011-01-17 NOTE — Patient Instructions (Signed)
Continue self-care efforts, improve efforts to be assertive rather than aggressive, use relaxation techniques

## 2011-01-25 ENCOUNTER — Encounter (HOSPITAL_COMMUNITY): Payer: Medicare Other | Admitting: Psychiatry

## 2011-01-25 ENCOUNTER — Ambulatory Visit (INDEPENDENT_AMBULATORY_CARE_PROVIDER_SITE_OTHER): Payer: Medicare Other | Admitting: Psychiatry

## 2011-01-25 ENCOUNTER — Encounter (HOSPITAL_COMMUNITY): Payer: Self-pay | Admitting: Psychiatry

## 2011-01-25 VITALS — Wt 341.0 lb

## 2011-01-25 DIAGNOSIS — F331 Major depressive disorder, recurrent, moderate: Secondary | ICD-10-CM

## 2011-01-25 MED ORDER — TRAZODONE HCL 150 MG PO TABS: 150.0000 mg | ORAL_TABLET | Freq: Every day | ORAL | Status: DC

## 2011-01-25 MED ORDER — BENZTROPINE MESYLATE 1 MG PO TABS: 1.0000 mg | ORAL_TABLET | Freq: Every day | ORAL | Status: DC

## 2011-01-25 MED ORDER — HALOPERIDOL 20 MG PO TABS: 20.0000 mg | ORAL_TABLET | Freq: Every day | ORAL | Status: DC

## 2011-01-25 MED ORDER — DISULFIRAM 250 MG PO TABS: 250.0000 mg | ORAL_TABLET | Freq: Every day | ORAL | Status: DC

## 2011-01-25 MED ORDER — ALPRAZOLAM 1 MG PO TABS: ORAL_TABLET | ORAL | Status: DC

## 2011-01-25 NOTE — Progress Notes (Signed)
Patient seen today he came for his followup appointment. The patient reported his mother has been hospitalized recently and his father was also very sick. He did not enjoy Thanksgiving as he was taking care of his parents. Overall he has been compliant with his medication and denies any recent hallucination agitation anger or mood swings. I review his medication he is is still taking multiple medication for his blood pressure pain and muscle spasm. He denies any alcohol or drugs. He is taking his Antabuse on a regular basis. He does not get early refill on his Xanax however he does require Xanax 3-4 times daily. He reported no side effects of medication. He has been seeing therapist regularly.  Mental status examination Patient is casually dressed with fair eye contact. He is cooperative and pleasant. His thought process is slow but logical linear and goal-directed. He has poverty of thought content but he denies any recent auditory hallucinations suicidal thoughts or homicidal thoughts. There are no psychotic symptoms present at this time though he does endorse at times some paranoid thinking. There is no delusions obsessions or grandiosity present at this time. His attention and concentration is fair. He described his mood as sad and his affect is constricted. He's alert and oriented x3. His speech is slow and coherent. He he reported no side effects of medication including tremors shakes or extrapyramidal side effects. His insight judgment and impulse control is okay.  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  Plan I will continue his Xanax 1 mg 3 times a day and 4th only as needed, Haldol point milligram at bedtime, Antabuse 250 mg daily, trazodone 150 mg 1-2 tablet at bedtime for sleep, Cogentin 1 mg at bedtime. Patient will get his Inderal from his primary care physician. I  explained risks and benefits of medication in detail and I will see him again in 2 months. He will continue to see therapist for increase coping and social skills.

## 2011-01-29 ENCOUNTER — Other Ambulatory Visit: Payer: Self-pay | Admitting: Family Medicine

## 2011-02-07 ENCOUNTER — Ambulatory Visit (HOSPITAL_COMMUNITY): Payer: Medicare Other | Admitting: Psychiatry

## 2011-02-07 ENCOUNTER — Encounter: Payer: Self-pay | Admitting: Family Medicine

## 2011-02-09 ENCOUNTER — Other Ambulatory Visit: Payer: Self-pay

## 2011-02-09 MED ORDER — OXYCODONE-ACETAMINOPHEN 10-325 MG PO TABS
1.0000 | ORAL_TABLET | Freq: Four times a day (QID) | ORAL | Status: DC | PRN
Start: 2011-02-09 — End: 2011-03-06

## 2011-02-12 ENCOUNTER — Encounter: Payer: Self-pay | Admitting: Family Medicine

## 2011-02-12 ENCOUNTER — Ambulatory Visit (INDEPENDENT_AMBULATORY_CARE_PROVIDER_SITE_OTHER): Payer: Medicare Other | Admitting: Family Medicine

## 2011-02-12 VITALS — BP 120/80 | HR 88 | Resp 16 | Ht 73.0 in | Wt 340.0 lb

## 2011-02-12 DIAGNOSIS — M1710 Unilateral primary osteoarthritis, unspecified knee: Secondary | ICD-10-CM

## 2011-02-12 DIAGNOSIS — R197 Diarrhea, unspecified: Secondary | ICD-10-CM

## 2011-02-12 DIAGNOSIS — F3289 Other specified depressive episodes: Secondary | ICD-10-CM

## 2011-02-12 DIAGNOSIS — E162 Hypoglycemia, unspecified: Secondary | ICD-10-CM

## 2011-02-12 DIAGNOSIS — I10 Essential (primary) hypertension: Secondary | ICD-10-CM

## 2011-02-12 DIAGNOSIS — F329 Major depressive disorder, single episode, unspecified: Secondary | ICD-10-CM

## 2011-02-12 DIAGNOSIS — E785 Hyperlipidemia, unspecified: Secondary | ICD-10-CM

## 2011-02-12 DIAGNOSIS — F17200 Nicotine dependence, unspecified, uncomplicated: Secondary | ICD-10-CM

## 2011-02-12 DIAGNOSIS — K589 Irritable bowel syndrome without diarrhea: Secondary | ICD-10-CM

## 2011-02-12 DIAGNOSIS — M549 Dorsalgia, unspecified: Secondary | ICD-10-CM

## 2011-02-12 MED ORDER — DICLOFENAC SODIUM 75 MG PO TBEC: 75.0000 mg | DELAYED_RELEASE_TABLET | Freq: Two times a day (BID) | ORAL | Status: DC

## 2011-02-12 MED ORDER — BENZTROPINE MESYLATE 1 MG PO TABS: 1.0000 mg | ORAL_TABLET | Freq: Every day | ORAL | Status: DC

## 2011-02-12 MED ORDER — DISULFIRAM 250 MG PO TABS: 250.0000 mg | ORAL_TABLET | Freq: Every day | ORAL | Status: DC

## 2011-02-12 MED ORDER — DIPHENOXYLATE-ATROPINE 2.5-0.025 MG PO TABS: 1.0000 | ORAL_TABLET | Freq: Four times a day (QID) | ORAL | Status: DC | PRN

## 2011-02-12 MED ORDER — HALOPERIDOL 20 MG PO TABS: 20.0000 mg | ORAL_TABLET | Freq: Every day | ORAL | Status: DC

## 2011-02-12 MED ORDER — DICYCLOMINE HCL 10 MG PO CAPS: ORAL_CAPSULE | ORAL | Status: DC

## 2011-02-12 NOTE — Assessment & Plan Note (Signed)
Improving , needs to quit

## 2011-02-12 NOTE — Assessment & Plan Note (Signed)
Currently stable and sees psych regularly, reports recent reduction in meds, constant auditory hallucinations, not ior homicidal

## 2011-02-12 NOTE — Assessment & Plan Note (Signed)
Unchanged improved control on current meds

## 2011-02-12 NOTE — Progress Notes (Signed)
Subjective:     Patient ID: Gavin Gray, male   DOB: 01-08-1966, 45 y.o.   MRN: [id number]  HPI Intermittent bouts of diarreah in the past 2 years, lasts up to 2 weeks. Generally happens every 2 months.Feels as though the symptoms are worsening in frequency and severity, should have had rectal exam today but feels sore, wants to have GI eval of the problem since this has never happened. Denies nausea States pain management is adequate. Reports improvement in depression and anxiety, medication has been reduced, sees therapist every 3 weeks approximately    Review of Systems See HPI Denies recent fever or chills. Denies sinus pressure, nasal congestion, ear pain or sore throat. Denies chest congestion, productive cough or wheezing. Denies chest pains, palpitations and leg swelling Cramping abdominal pain, and diarrhea Denies dysuria, frequency, hesitancy or incontinence. Denies headaches, seizures, numbness, or tingling. . Denies skin break down or rash.        Objective:   Physical Exam Patient alert and oriented and in no cardiopulmonary distress.  HEENT: No facial asymmetry, EOMI, no sinus tenderness,  oropharynx pink and moist.  Neck supple no adenopathy.  Chest: Clear to auscultation bilaterally.  CVS: S1, S2 no murmurs, no S3.  ABD: Soft diffuse superficial tenderness, no guarding or rebound, hyperactive bowel sounds Ext: No edema  MS: decrease  ROM spine, shoulders, hips and knees.  Skin: Intact, no ulcerations or rash noted.  Psych: Good eye contact, normal affect. Memory intact not anxious or depressed appearing.  CNS: CN 2-12 intact, power, t normal throughout.    Assessment:         Plan:

## 2011-02-12 NOTE — Assessment & Plan Note (Signed)
Worsening sporadic diarreah, has never been evaluated by gI for this I agree seems to be IBS, will treat symptomatically, obtain stool cultures and get formal GI eval

## 2011-02-12 NOTE — Patient Instructions (Addendum)
F/U in 4 months.  You are referred to gI about recurrent diarrhea. Medication has been sent in short term, and I request that you also send stool samples.  Voltaren is prescribed as before for knee pain.  Continue to exercise regulalrly , and work on weight loss please.  Today's labs will be reviewed, if there is major concern , you will be called.  HBA1C and chem 7 in 4 months, bEFORE next visit please

## 2011-02-12 NOTE — Assessment & Plan Note (Signed)
Controlled, no change in medication  

## 2011-02-12 NOTE — Assessment & Plan Note (Signed)
Hyperlipidemia:Low fat diet discussed and encouraged.  Uncontrolled when last checked, will f/u on labs today

## 2011-02-13 ENCOUNTER — Telehealth (HOSPITAL_COMMUNITY): Payer: Self-pay | Admitting: *Deleted

## 2011-02-13 NOTE — Telephone Encounter (Signed)
Fax refill request for Haloperidol 10 mg. Tablet. 60 tab. Take two tablets by mouth at bedtime.

## 2011-02-26 ENCOUNTER — Ambulatory Visit (INDEPENDENT_AMBULATORY_CARE_PROVIDER_SITE_OTHER): Payer: Medicare Other | Admitting: Psychiatry

## 2011-02-26 ENCOUNTER — Other Ambulatory Visit (HOSPITAL_COMMUNITY): Payer: Self-pay | Admitting: *Deleted

## 2011-02-26 ENCOUNTER — Encounter (HOSPITAL_COMMUNITY): Payer: Self-pay | Admitting: Psychiatry

## 2011-02-26 DIAGNOSIS — F419 Anxiety disorder, unspecified: Secondary | ICD-10-CM

## 2011-02-26 DIAGNOSIS — F411 Generalized anxiety disorder: Secondary | ICD-10-CM

## 2011-02-26 DIAGNOSIS — F331 Major depressive disorder, recurrent, moderate: Secondary | ICD-10-CM

## 2011-02-26 NOTE — Patient Instructions (Signed)
Discussed orally 

## 2011-02-28 NOTE — Progress Notes (Signed)
Patient:  Gavin Gray   DOB: [birth year]  MR Number: [id number]  [facility]: [facility]:  [address] [place],  [place], [address]  Start:  Monday 02/26/2011 1:10 PM End:  Monday 02/26/2011 2 PM  Provider/Observer:     Florencia Reasons, MSW, LCSW   Chief Complaint:      Chief Complaint  Patient presents with  . Depression  . Anxiety    Reason For Service:     The patient initially was referred for services upon his discharge from the [facility] where he was treated for depression and suicidal ideations. Patient has a long-standing history of recurrent periods of depression and anxiety.  Interventions Strategy:  Supportive therapy, cognitive behavioral therapy  Participation Level:   Active  Participation Quality:  Appropriate      Behavioral Observation:  Fairly Groomed, Alert, and anxious  Current Psychosocial Factors: The patient reports his girlfriend attempted suicide in November after she and patient argued. He continues to report stress associated related to his mother who continues to have health issues and was recently discharged from another hospitalization. Patient also worries about his youngest sister who has substance abuse/dependence issues.  Content of Session:   Reviewing symptoms, processing feelings, identifying ways to improve self-care and increase physical activity, reviewing relaxation techniques  Current Status:   Patient reports continued anxiety but decreased intensity  Patient Progress:   Good.  Patient reports decreased stress regarding his immediate family as there is less conflict in patient's household. His adult children have been more responsive and supportive due to patient's mother's health per patient's report. He expresses appropriate concern about his mother and reports visiting her frequently. She expresses concern about his girlfriend due to her recent suicide attempt but is relieved that her medication has been  reduced.  Patient reports increased worry about his youngest sister as she has little to no contact with the family and did not visit during the holidays. He reports recurrent dreams of finding his sister deaid from a drug overdose. The patient is making improvement regarding self-care and maintains attendance at the gym 3 times a week. The patient also reports improved eating habits.  Impression/Diagnosis:   The patient has a long-standing history of recurrent periods of depression along with anxiety and panic attacks. Diagnoses: Maj. depressive disorder, anxiety disorder NOS  Diagnosis:  Axis I:  1. Major depressive disorder, recurrent episode, moderate   2. Anxiety disorder             Axis II: Deferred

## 2011-03-05 ENCOUNTER — Ambulatory Visit (INDEPENDENT_AMBULATORY_CARE_PROVIDER_SITE_OTHER): Payer: Medicaid Other | Admitting: Gastroenterology

## 2011-03-05 ENCOUNTER — Other Ambulatory Visit: Payer: Self-pay | Admitting: Gastroenterology

## 2011-03-05 ENCOUNTER — Encounter: Payer: Self-pay | Admitting: Gastroenterology

## 2011-03-05 VITALS — BP 122/80 | HR 87 | Temp 97.3°F | Ht 73.0 in | Wt 342.0 lb

## 2011-03-05 DIAGNOSIS — K219 Gastro-esophageal reflux disease without esophagitis: Secondary | ICD-10-CM | POA: Insufficient documentation

## 2011-03-05 DIAGNOSIS — K529 Noninfective gastroenteritis and colitis, unspecified: Secondary | ICD-10-CM

## 2011-03-05 DIAGNOSIS — R197 Diarrhea, unspecified: Secondary | ICD-10-CM

## 2011-03-05 MED ORDER — DICYCLOMINE HCL 10 MG PO CAPS: 20.0000 mg | ORAL_CAPSULE | Freq: Three times a day (TID) | ORAL | Status: DC

## 2011-03-05 NOTE — Assessment & Plan Note (Signed)
46 year old male with several year history of watery diarrhea, sometimes up to 8 per day. Since starting Lomotil and Bentyl he has noticed significant improvement. Lower abdominal cramping preceding loose stools. No melena or hematochezia noted. Likely dealing with IBS but need to assess for other underlying process. Will obtain baseline stool studies, TSH, celiac panel. No anemia on recent CBC. Check hemoccult status. No need for colonoscopy at this time unless heme + or worsening diarrhea despite dietary and medication regimen.   Cdiff PCR, lactoferrin, culture, Giardia TTG, IgA and serum IgA TSH ifobt Probiotic daily, high fiber diet Stop Lomitil Increase Bentyl to 20 mg QID Further recommendations after labs, stool studies obtained

## 2011-03-05 NOTE — Patient Instructions (Signed)
Please complete stool studies and return to the lab. Complete lab work as well.  We are checking your stool for blood as well; this will need to be returned to our office.  Stop taking Lomotil. You may take imodium as needed. Let's try a high fiber diet and a probiotic daily. Avoid fatty, greasy foods. Start taking Bentyl 2 tablets before meals and at bedtime. Watch for constipation.   We have also set you up for an upper endoscopy due to your continued reflux and swallowing problems. Please follow the reflux diet provided. Do not go to bed on a full stomach. Wait at least 2-3 hours after last meal before laying down. Continue taking Nexium daily, 30 minutes before breakfast. We may need to change this medication in the near future.
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
## 2011-03-05 NOTE — Assessment & Plan Note (Signed)
Continued GERD despite Nexium daily and Zantac BID. Pt has had significant weight gain over past several years, which is likely the culprit. However, he does note intermittent dysphagia, specifically in the morning. Mild nausea noted as well intermittently. Due to chronic reflux symptoms, failed daily PPI, and dysphagia, will proceed with EGD. Discussed weight loss and dietary choices. Will continue Nexium daily and may need BID dosing versus PPI change.   This will need to be done in the OR with Propofol secondary to polypharmacy and hx ETOH abuse.  Proceed with upper endoscopy, possible dilation in the near future with Dr. Jena Gauss. The risks, benefits, and alternatives have been discussed in detail with patient. They have stated understanding and desire to proceed.

## 2011-03-06 ENCOUNTER — Telehealth: Payer: Self-pay | Admitting: Gastroenterology

## 2011-03-06 ENCOUNTER — Encounter (HOSPITAL_COMMUNITY): Payer: Self-pay | Admitting: Pharmacy Technician

## 2011-03-06 NOTE — Progress Notes (Signed)
Cc to PCP 

## 2011-03-06 NOTE — Telephone Encounter (Signed)
LMOM with Pre Op date/time- 01/11/ @ 10:30

## 2011-03-09 ENCOUNTER — Encounter (HOSPITAL_COMMUNITY)
Admission: RE | Admit: 2011-03-09 | Discharge: 2011-03-09 | Disposition: A | Discharge status: Home / Self Care | Payer: Medicare Other | Source: Ambulatory Visit | Attending: Internal Medicine | Admitting: Internal Medicine

## 2011-03-09 ENCOUNTER — Other Ambulatory Visit: Payer: Self-pay

## 2011-03-09 ENCOUNTER — Encounter (HOSPITAL_COMMUNITY): Payer: Self-pay

## 2011-03-09 LAB — BASIC METABOLIC PANEL
CO2: 32 mEq/L (ref 19–32)
Chloride: 102 mEq/L (ref 96–112)
Glucose, Bld: 100 mg/dL — ABNORMAL HIGH (ref 70–99)
Potassium: 4.8 mEq/L (ref 3.5–5.1)
Sodium: 138 mEq/L (ref 135–145)

## 2011-03-09 LAB — HEMOGLOBIN AND HEMATOCRIT, BLOOD: HCT: 41.9 % (ref 39.0–52.0)

## 2011-03-09 NOTE — Patient Instructions (Addendum)
21 Gavin Gray  03/09/2011   Your procedure is scheduled on:  03/15/2011  Report to [facility] at  910  AM.  Call this number if you have problems the morning of surgery: [phone]   Remember:   Do not eat food:After Midnight.  May have clear liquids:until Midnight .  Clear liquids include soda, tea, black coffee, apple or grape juice, broth.  Take these medicines the morning of surgery with A SIP OF WATER: lotrel,benztropine,dicyclomine,nexium,haloperidol,propranolol,zantac,flexarilgabapentin,oxycodone,xanax. Take flonase,proair and duoneb before you come   Do not wear jewelry, make-up or nail polish.  Do not wear lotions, powders, or perfumes. You may wear deodorant.  Do not shave 48 hours prior to surgery.  Do not bring valuables to the hospital.  Contacts, dentures or bridgework may not be worn into surgery.  Leave suitcase in the car. After surgery it may be brought to your room.  For patients admitted to the hospital, checkout time is 11:00 AM the day of discharge.   Patients discharged the day of surgery will not be allowed to drive home.  Name and phone number of your driver: family  Special Instructions: N/A   Please read over the following fact sheets that you were given: Pain Booklet, Surgical Site Infection Prevention, Anesthesia Post-op Instructions and Care and Recovery After Surgery Esophagogastroduodenoscopy This is an endoscopic procedure (a procedure that uses a device like a flexible telescope) that allows your caregiver to view the upper stomach and small bowel. This test allows your caregiver to look at the esophagus. The esophagus carries food from your mouth to your stomach. They can also look at your duodenum. This is the first part of the small intestine that attaches to the stomach. This test is used to detect problems in the bowel such as ulcers and inflammation. PREPARATION FOR TEST Nothing to eat after midnight the day before the test. NORMAL  FINDINGS Normal esophagus, stomach, and duodenum. Ranges for normal findings may vary among different laboratories and hospitals. You should always check with your doctor after having lab work or other tests done to discuss the meaning of your test results and whether your values are considered within normal limits. MEANING OF TEST  Your caregiver will go over the test results with you and discuss the importance and meaning of your results, as well as treatment options and the need for additional tests if necessary. OBTAINING THE TEST RESULTS It is your responsibility to obtain your test results. Ask the lab or department performing the test when and how you will get your results. Document Released: 06/15/2004 Document Revised: 10/25/2010 Document Reviewed: 01/23/2008 [facility] Patient Information 2012 [place], [place].PATIENT INSTRUCTIONS POST-ANESTHESIA  IMMEDIATELY FOLLOWING SURGERY:  Do not drive or operate machinery for the first twenty four hours after surgery.  Do not make any important decisions for twenty four hours after surgery or while taking narcotic pain medications or sedatives.  If you develop intractable nausea and vomiting or a severe headache please notify your doctor immediately.  FOLLOW-UP:  Please make an appointment with your surgeon as instructed. You do not need to follow up with anesthesia unless specifically instructed to do so.  WOUND CARE INSTRUCTIONS (if applicable):  Keep a dry clean dressing on the anesthesia/puncture wound site if there is drainage.  Once the wound has quit draining you may leave it open to air.  Generally you should leave the bandage intact for twenty four hours unless there is drainage.  If the epidural site drains for more than  36-48 hours please call the anesthesia department.  QUESTIONS?:  Please feel free to call your physician or the hospital operator if you have any questions, and they will be happy to assist you.     [facility]  Anesthesia Department [address] [place] [phone]

## 2011-03-12 ENCOUNTER — Other Ambulatory Visit: Payer: Self-pay | Admitting: Family Medicine

## 2011-03-14 DIAGNOSIS — R1310 Dysphagia, unspecified: Secondary | ICD-10-CM

## 2011-03-14 DIAGNOSIS — K219 Gastro-esophageal reflux disease without esophagitis: Secondary | ICD-10-CM

## 2011-03-14 DIAGNOSIS — K2960 Other gastritis without bleeding: Secondary | ICD-10-CM

## 2011-03-15 ENCOUNTER — Encounter (HOSPITAL_COMMUNITY): Payer: Self-pay | Admitting: Anesthesiology

## 2011-03-15 ENCOUNTER — Ambulatory Visit (HOSPITAL_COMMUNITY): Payer: Medicare Other | Admitting: Anesthesiology

## 2011-03-15 ENCOUNTER — Other Ambulatory Visit: Payer: Self-pay | Admitting: Internal Medicine

## 2011-03-15 ENCOUNTER — Encounter (HOSPITAL_COMMUNITY)
Admission: RE | Disposition: A | Discharge status: Home / Self Care | Payer: Self-pay | Source: Ambulatory Visit | Attending: Internal Medicine

## 2011-03-15 ENCOUNTER — Ambulatory Visit (HOSPITAL_COMMUNITY)
Admission: RE | Admit: 2011-03-15 | Discharge: 2011-03-15 | Disposition: A | Discharge status: Home / Self Care | Payer: Medicare Other | Source: Ambulatory Visit | Attending: Internal Medicine | Admitting: Internal Medicine

## 2011-03-15 ENCOUNTER — Encounter (HOSPITAL_COMMUNITY): Payer: Self-pay | Admitting: *Deleted

## 2011-03-15 DIAGNOSIS — Z6841 Body Mass Index (BMI) 40.0 and over, adult: Secondary | ICD-10-CM | POA: Insufficient documentation

## 2011-03-15 DIAGNOSIS — J4489 Other specified chronic obstructive pulmonary disease: Secondary | ICD-10-CM | POA: Insufficient documentation

## 2011-03-15 DIAGNOSIS — K2940 Chronic atrophic gastritis without bleeding: Secondary | ICD-10-CM | POA: Insufficient documentation

## 2011-03-15 DIAGNOSIS — R1310 Dysphagia, unspecified: Secondary | ICD-10-CM | POA: Insufficient documentation

## 2011-03-15 DIAGNOSIS — E785 Hyperlipidemia, unspecified: Secondary | ICD-10-CM | POA: Insufficient documentation

## 2011-03-15 DIAGNOSIS — K449 Diaphragmatic hernia without obstruction or gangrene: Secondary | ICD-10-CM | POA: Insufficient documentation

## 2011-03-15 DIAGNOSIS — Z79899 Other long term (current) drug therapy: Secondary | ICD-10-CM | POA: Insufficient documentation

## 2011-03-15 DIAGNOSIS — Z01810 Encounter for preprocedural cardiovascular examination: Secondary | ICD-10-CM | POA: Insufficient documentation

## 2011-03-15 DIAGNOSIS — Z01812 Encounter for preprocedural laboratory examination: Secondary | ICD-10-CM | POA: Insufficient documentation

## 2011-03-15 DIAGNOSIS — J449 Chronic obstructive pulmonary disease, unspecified: Secondary | ICD-10-CM | POA: Insufficient documentation

## 2011-03-15 DIAGNOSIS — I10 Essential (primary) hypertension: Secondary | ICD-10-CM | POA: Insufficient documentation

## 2011-03-15 HISTORY — PX: MALONEY DILATION: SHX5535

## 2011-03-15 SURGERY — ESOPHAGOGASTRODUODENOSCOPY (EGD) WITH PROPOFOL
Anesthesia: Monitor Anesthesia Care

## 2011-03-15 MED ORDER — FENTANYL CITRATE 0.05 MG/ML IJ SOLN: 25.0000 ug | INTRAMUSCULAR | Status: DC | PRN

## 2011-03-15 MED ORDER — ONDANSETRON HCL 4 MG/2ML IJ SOLN: 4.0000 mg | Freq: Once | INTRAMUSCULAR | Status: DC | PRN

## 2011-03-15 MED ORDER — MIDAZOLAM HCL 2 MG/2ML IJ SOLN
INTRAMUSCULAR | Status: AC
  Filled 2011-03-15: qty 2

## 2011-03-15 MED ORDER — MIDAZOLAM HCL 2 MG/2ML IJ SOLN
INTRAMUSCULAR | Status: AC
  Administered 2011-03-15: 2 mg via INTRAVENOUS
  Filled 2011-03-15: qty 2

## 2011-03-15 MED ORDER — STERILE WATER FOR IRRIGATION IR SOLN
Status: DC | PRN
  Administered 2011-03-15: 09:00:00

## 2011-03-15 MED ORDER — LACTATED RINGERS IV SOLN
INTRAVENOUS | Status: DC
  Administered 2011-03-15: 1000 mL via INTRAVENOUS

## 2011-03-15 MED ORDER — BUTAMBEN-TETRACAINE-BENZOCAINE 2-2-14 % EX AERO
1.0000 | INHALATION_SPRAY | Freq: Once | CUTANEOUS | Status: AC
  Administered 2011-03-15: 1 via TOPICAL
  Filled 2011-03-15: qty 56

## 2011-03-15 MED ORDER — PROPOFOL 10 MG/ML IV EMUL
INTRAVENOUS | Status: AC
  Filled 2011-03-15: qty 20

## 2011-03-15 MED ORDER — GLYCOPYRROLATE 0.2 MG/ML IJ SOLN
INTRAMUSCULAR | Status: AC
  Administered 2011-03-15: 0.2 mg via INTRAVENOUS
  Filled 2011-03-15: qty 1

## 2011-03-15 MED ORDER — PROPOFOL 10 MG/ML IV EMUL
INTRAVENOUS | Status: DC | PRN
  Administered 2011-03-15: 75 ug/kg/min via INTRAVENOUS

## 2011-03-15 MED ORDER — MIDAZOLAM HCL 2 MG/2ML IJ SOLN
1.0000 mg | INTRAMUSCULAR | Status: DC | PRN
  Administered 2011-03-15 (×2): 2 mg via INTRAVENOUS

## 2011-03-15 MED ORDER — MIDAZOLAM HCL 5 MG/5ML IJ SOLN
INTRAMUSCULAR | Status: DC | PRN
  Administered 2011-03-15: 2 mg via INTRAVENOUS

## 2011-03-15 MED ORDER — ONDANSETRON HCL 4 MG/2ML IJ SOLN
INTRAMUSCULAR | Status: AC
  Administered 2011-03-15: 4 mg via INTRAVENOUS
  Filled 2011-03-15: qty 2

## 2011-03-15 MED ORDER — LIDOCAINE HCL (PF) 1 % IJ SOLN
INTRAMUSCULAR | Status: AC
  Filled 2011-03-15: qty 5

## 2011-03-15 MED ORDER — ONDANSETRON HCL 4 MG/2ML IJ SOLN
4.0000 mg | Freq: Once | INTRAMUSCULAR | Status: AC
  Administered 2011-03-15: 4 mg via INTRAVENOUS

## 2011-03-15 MED ORDER — GLYCOPYRROLATE 0.2 MG/ML IJ SOLN
0.2000 mg | Freq: Once | INTRAMUSCULAR | Status: AC
  Administered 2011-03-15: 0.2 mg via INTRAVENOUS

## 2011-03-15 SURGICAL SUPPLY — 19 items
BLOCK BITE 60FR ADLT L/F BLUE (MISCELLANEOUS) ×2 IMPLANT
DEVICE CLIP HEMOSTAT 235CM (CLIP) IMPLANT
ELECT REM PT RETURN 9FT ADLT (ELECTROSURGICAL)
ELECTRODE REM PT RTRN 9FT ADLT (ELECTROSURGICAL) IMPLANT
FLOOR PAD 36X40 (MISCELLANEOUS) ×2
FORCEP RJ3 GP 1.8X160 W-NEEDLE (CUTTING FORCEPS) ×1 IMPLANT
FORCEPS BIOP RAD 4 LRG CAP 4 (CUTTING FORCEPS) ×2 IMPLANT
MANIFOLD NEPTUNE WASTE (CANNULA) ×2 IMPLANT
NDL SCLEROTHERAPY 25GX240 (NEEDLE) ×1 IMPLANT
NEEDLE SCLEROTHERAPY 25GX240 (NEEDLE) IMPLANT
PAD FLOOR 36X40 (MISCELLANEOUS) ×1 IMPLANT
PROBE APC STR FIRE (PROBE) ×1 IMPLANT
PROBE INJECTION GOLD (MISCELLANEOUS)
PROBE INJECTION GOLD 7FR (MISCELLANEOUS) ×1 IMPLANT
SNARE ROTATE MED OVAL 20MM (MISCELLANEOUS) ×1 IMPLANT
SYR 50ML LL SCALE MARK (SYRINGE) ×1 IMPLANT
TUBING ENDO SMARTCAP PENTAX (MISCELLANEOUS) ×2 IMPLANT
TUBING IRRIGATION ENDOGATOR (MISCELLANEOUS) ×2 IMPLANT
WATER STERILE IRR 1000ML POUR (IV SOLUTION) ×1 IMPLANT

## 2011-03-15 NOTE — Anesthesia Preprocedure Evaluation (Addendum)
Anesthesia Evaluation  Patient identified by MRN, date of birth, ID band Patient awake    Reviewed: Allergy & Precautions, H&P , NPO status , [name] Chart, lab work & pertinent test results  History of Anesthesia Complications Negative for: history of anesthetic complications  Airway Mallampati: II      Dental  (+) Teeth Intact   Pulmonary COPD COPD inhaler,  clear to auscultation        Cardiovascular hypertension, Pt. on medications Regular Normal    Neuro/Psych PSYCHIATRIC DISORDERS Anxiety Depression Schizophrenia    GI/Hepatic GERD- (dysphagia)  Medicated,  Endo/Other  Morbid obesity  Renal/GU      Musculoskeletal   Abdominal (+) obese,   Peds  Hematology   Anesthesia Other Findings   Reproductive/Obstetrics                           Anesthesia Physical Anesthesia Plan  ASA: III  Anesthesia Plan: MAC   Post-op Pain Management:    Induction: Intravenous  Airway Management Planned: Nasal Cannula  Additional Equipment:   Intra-op Plan:   Post-operative Plan:   Informed Consent: I have reviewed the patients History and Physical, chart, labs and discussed the procedure including the risks, benefits and alternatives for the proposed anesthesia with the patient or authorized representative who has indicated his/her understanding and acceptance.     Plan Discussed with:   Anesthesia Plan Comments:         Anesthesia Quick Evaluation

## 2011-03-15 NOTE — Op Note (Signed)
[facility] [address] [place], [place]  [address]  ENDOSCOPY PROCEDURE REPORT  PATIENT:  Gavin Gray, Gavin Gray  MR#:  [id number] BIRTHDATE:  04-May-1965, 45 yrs. old  GENDER:  male  ENDOSCOPIST:  R. Roetta Sessions, MD Caleen Essex Referred by:                 Syliva Overman, M.D.  PROCEDURE DATE:  03/15/2011 PROCEDURE:  EGD with Elease Hashimoto dilation, gastric biopsy  INDICATIONS:  refractory GERD and intermittent esophageal dysphagia  INFORMED CONSENT:   The risks, benefits, limitations, alternatives and imponderables have been discussed.  The potential for biopsy, esophogeal dilation, etc. have also been reviewed.  Questions have been answered.  All parties agreeable.  Please see the history and physical in the medical record for more information.  MEDICATIONS:sedation per Dr. [facility]  DESCRIPTION OF PROCEDURE:   The  endoscope was introduced through the mouth and advanced to the second portion of the duodenum without difficulty or limitations.  The mucosal surfaces were surveyed very carefully during advancement of the scope and upon withdrawal.  Retroflexion view of the proximal stomach and esophagogastric junction was performed.  <<PROCEDUREIMAGES>>  FINDINGS:  normal appearing tubular esophagus. Normal mucosa. stomach empty. Small hiatal hernia. Antral erosions. Normal first and                    second portion of the duodenum.  THERAPEUTIC / DIAGNOSTIC MANEUVERS PERFORMED:    A 56 French Maloney dilator was passed up to full insertion easily.  A look back revealed no apparent complication related to passage of the dilator.  Subsequently, biopsies of the antrum were taken for histologic study  COMPLICATIONS:   None  IMPRESSION:   Normal esophagus-status post Maloney dilation. Small hiatal hernia. Antral erosions. Antral biopsy  RECOMMENDATIONS:      Stop Nexium; begin Dexilant 60 mg orally daily. Continued weight loss encouraged. Use Zantac only  as needed for breakthrough reflux symptoms.  Further recommendations to follow pending review of pathology report  ______________________________ R. Roetta Sessions, MD Caleen Essex  CC:  n. eSIGNED:   R. Roetta Sessions at 03/15/2011 09:43 AM  Weyman Croon, [id number]

## 2011-03-15 NOTE — Transfer of Care (Signed)
Immediate Anesthesia Transfer of Care Note  Patient: Gavin Gray  Procedure(s) Performed:  ESOPHAGOGASTRODUODENOSCOPY (EGD) WITH PROPOFOL - 10:30; MALONEY DILATION - #56 Maloney  Patient Location: PACU  Anesthesia Type: MAC  Level of Consciousness: awake, alert  and oriented  Airway & Oxygen Therapy: Patient Spontanous Breathing and Patient connected to face mask oxygen  Post-op Assessment: Report given to PACU RN  Post vital signs: Reviewed and stable Filed Vitals:   03/15/11 0840  BP: 170/90  Pulse:   Temp:   Resp: 18    Complications: No apparent anesthesia complications

## 2011-03-15 NOTE — H&P (Signed)
  I have seen & examined the patient prior to the procedure(s) today and reviewed the history and physical/consultation.  I have reviewed patient's medical problems. I've emphasized weight loss. He's already lost 10 pounds. I again reviewed today's procedure including the potential for esophageal dilation. Otherwise, There have been no changes.  After consideration of the risks, benefits, alternatives and imponderables, the patient has consented to the procedure(s).

## 2011-03-15 NOTE — Progress Notes (Signed)
From OR. Awake. Talking. Throat numb. Swallowing without difficulty. Voices no c/o at this time.

## 2011-03-15 NOTE — Anesthesia Postprocedure Evaluation (Signed)
  Anesthesia Post-op Note  Patient: Gavin Gray  Procedure(s) Performed:  ESOPHAGOGASTRODUODENOSCOPY (EGD) WITH PROPOFOL - 10:30; MALONEY DILATION - #56 Maloney  Patient Location: PACU  Anesthesia Type: MAC  Level of Consciousness: awake, alert  and oriented  Airway and Oxygen Therapy: Patient Spontanous Breathing and Patient connected to face mask oxygen  Post-op Pain: none  Post-op Assessment: Post-op Vital signs reviewed, [name] Cardiovascular Status Stable, Respiratory Function Stable, Patent Airway and No signs of Nausea or vomiting  Post-op Vital Signs: Reviewed and stable  Complications: No apparent anesthesia complications

## 2011-03-16 ENCOUNTER — Other Ambulatory Visit: Payer: Self-pay

## 2011-03-16 MED ORDER — OXYCODONE-ACETAMINOPHEN 10-325 MG PO TABS: 1.0000 | ORAL_TABLET | Freq: Four times a day (QID) | ORAL | Status: DC | PRN

## 2011-03-18 ENCOUNTER — Encounter: Payer: Self-pay | Admitting: Internal Medicine

## 2011-03-19 ENCOUNTER — Other Ambulatory Visit: Payer: Self-pay

## 2011-03-19 ENCOUNTER — Telehealth: Payer: Self-pay | Admitting: Internal Medicine

## 2011-03-19 ENCOUNTER — Encounter (HOSPITAL_COMMUNITY): Payer: Self-pay | Admitting: Internal Medicine

## 2011-03-19 ENCOUNTER — Ambulatory Visit (INDEPENDENT_AMBULATORY_CARE_PROVIDER_SITE_OTHER): Payer: Medicare Other | Admitting: Psychiatry

## 2011-03-19 DIAGNOSIS — F331 Major depressive disorder, recurrent, moderate: Secondary | ICD-10-CM

## 2011-03-19 DIAGNOSIS — F419 Anxiety disorder, unspecified: Secondary | ICD-10-CM

## 2011-03-19 DIAGNOSIS — F411 Generalized anxiety disorder: Secondary | ICD-10-CM

## 2011-03-19 MED ORDER — OXYCODONE-ACETAMINOPHEN 10-325 MG PO TABS: 1.0000 | ORAL_TABLET | Freq: Four times a day (QID) | ORAL | Status: DC | PRN
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
## 2011-03-19 NOTE — Telephone Encounter (Signed)
Patient called requesting Procedure results please advise??

## 2011-03-19 NOTE — Telephone Encounter (Signed)
Please call him and read him the letter which is in the mail on its way to him

## 2011-03-19 NOTE — Patient Instructions (Signed)
Practice mindfulness activity using breath awareness 

## 2011-03-20 NOTE — Telephone Encounter (Signed)
Pt is aware of results. 

## 2011-03-27 ENCOUNTER — Ambulatory Visit (INDEPENDENT_AMBULATORY_CARE_PROVIDER_SITE_OTHER): Payer: Medicare Other | Admitting: Psychiatry

## 2011-03-27 ENCOUNTER — Encounter (HOSPITAL_COMMUNITY): Payer: Self-pay | Admitting: Psychiatry

## 2011-03-27 VITALS — Wt 342.0 lb

## 2011-03-27 DIAGNOSIS — F319 Bipolar disorder, unspecified: Secondary | ICD-10-CM

## 2011-03-27 MED ORDER — ALPRAZOLAM 1 MG PO TABS: 1.0000 mg | ORAL_TABLET | Freq: Three times a day (TID) | ORAL | Status: DC | PRN

## 2011-03-27 MED ORDER — HALOPERIDOL 20 MG PO TABS: 20.0000 mg | ORAL_TABLET | Freq: Every day | ORAL | Status: DC
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
## 2011-04-02 ENCOUNTER — Ambulatory Visit (HOSPITAL_COMMUNITY): Payer: Medicare Other | Admitting: Psychiatry

## 2011-04-11 IMAGING — CT CT ABD-PELV W/ CM
2 of 7 series · 14 of 46 positions shown, 19 images · IV contrast (Omnipaque 300)
Comparison: 08/12/2007

CLINICAL DATA: Chills, back pain.  Epigastric pain for 2 days.  No
prior abdominal surgery.  Pain goes to the back.

CT ABDOMEN AND PELVIS WITH CONTRAST
TECHNIQUE: Multidetector CT imaging of the abdomen and pelvis was
performed following the standard protocol during bolus
administration of intravenous contrast.
Contrast: 100 ml Omnipaque-6VV

[Series 2: abd_pel_with 5.0 b40f · axial · 0.98mm/px · z∈[-554,-94]mm · 11 of 106 slices shown, 16 images]
[im 7/106  soft-tissue]
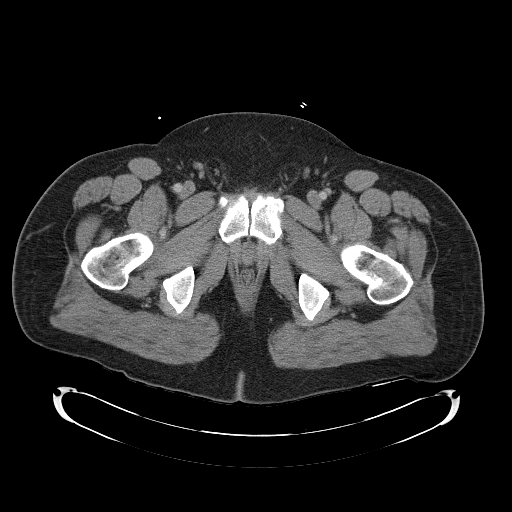
[im 7/106  bone]
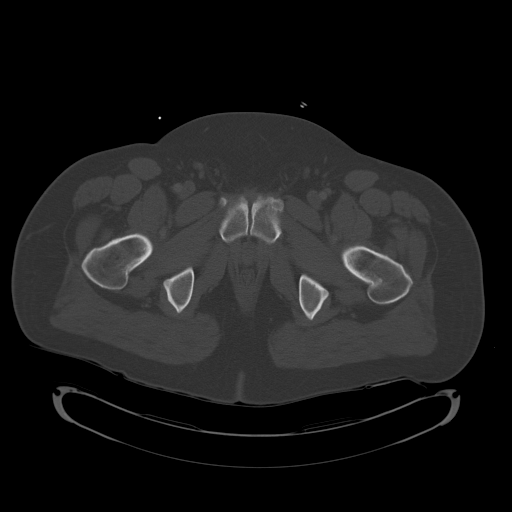
[im 20/106  soft-tissue]
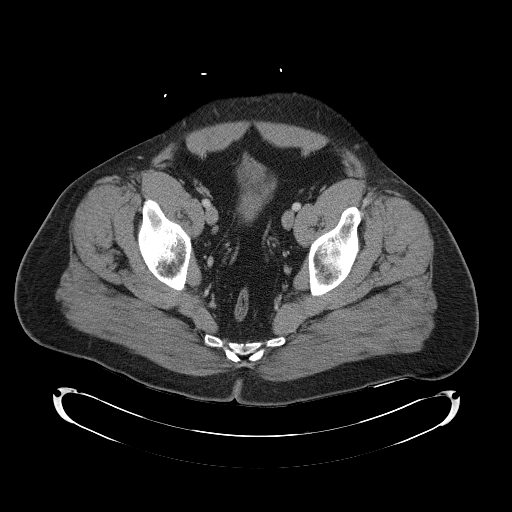
[im 27/106  soft-tissue]
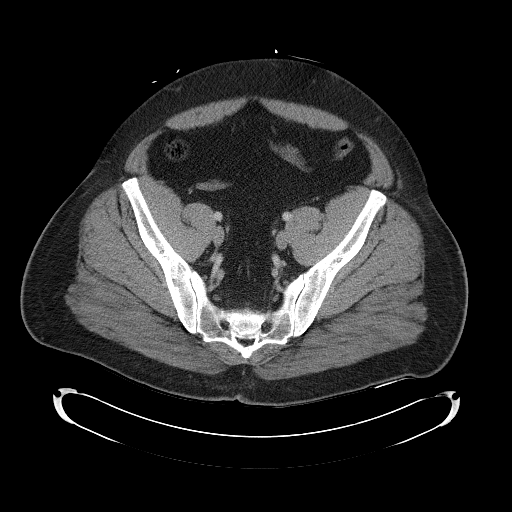
[im 40/106  soft-tissue]
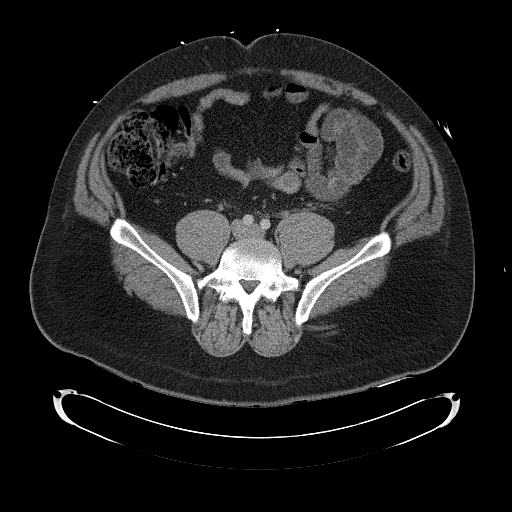
[im 46/106  soft-tissue]
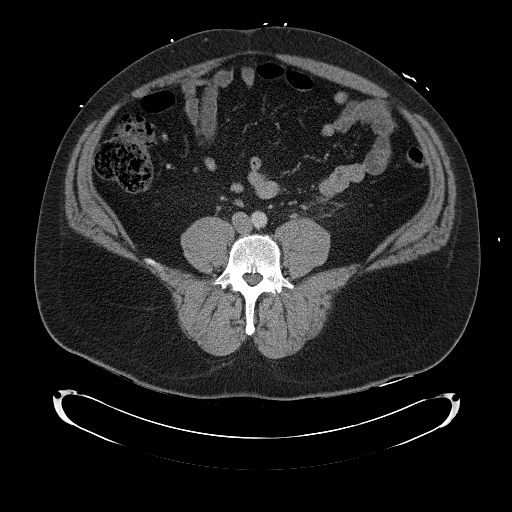
[im 60/106  soft-tissue]
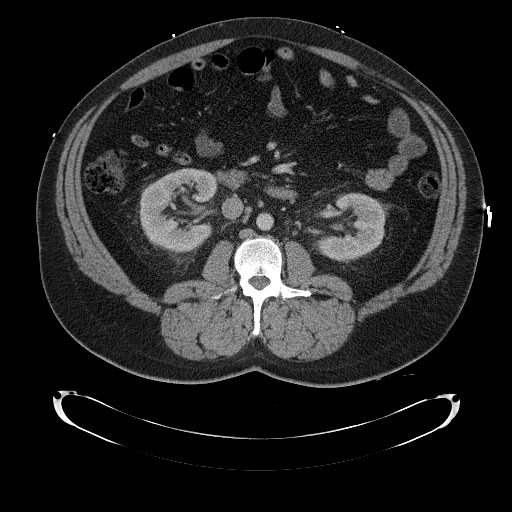
[im 66/106  soft-tissue]
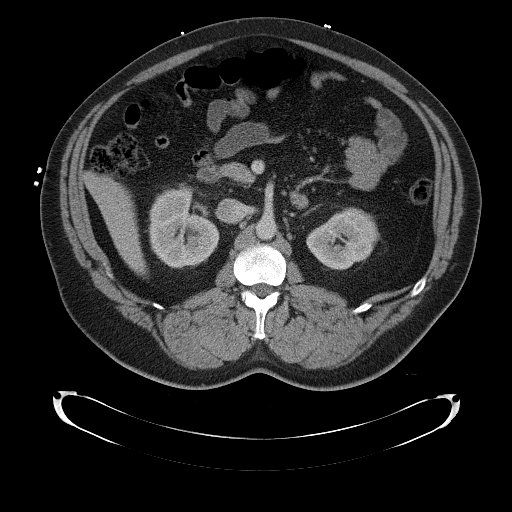
[im 79/106  soft-tissue]
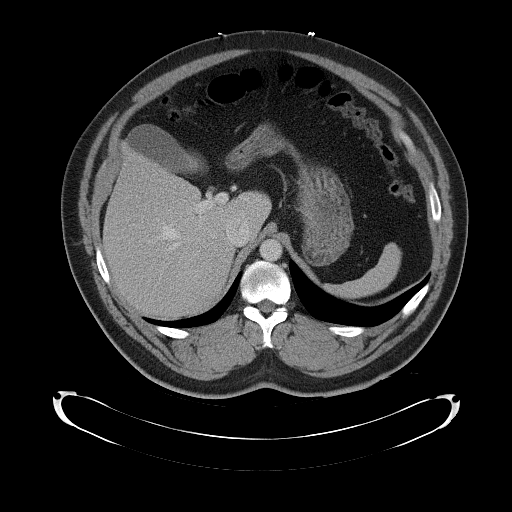
[im 79/106  lung]
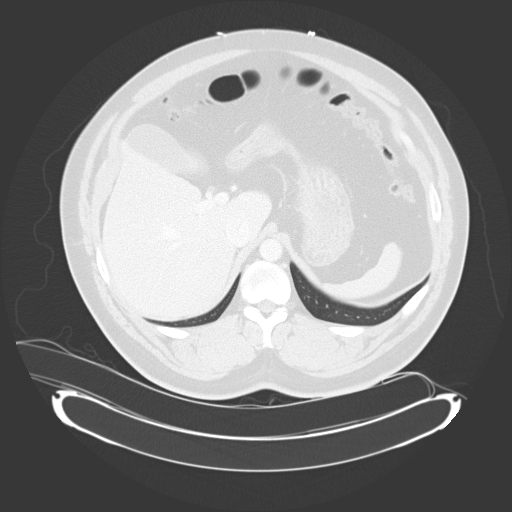
[im 86/106  soft-tissue]
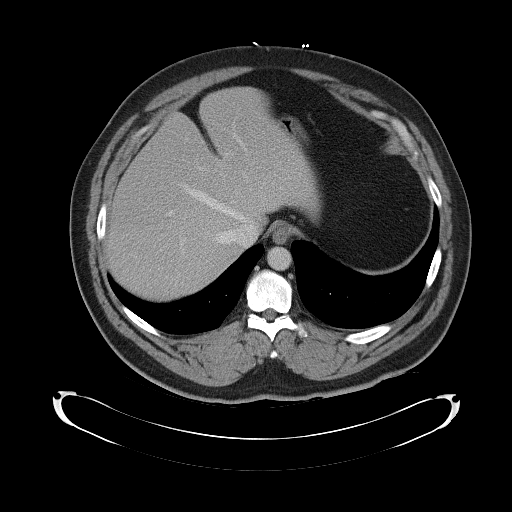
[im 86/106  lung]
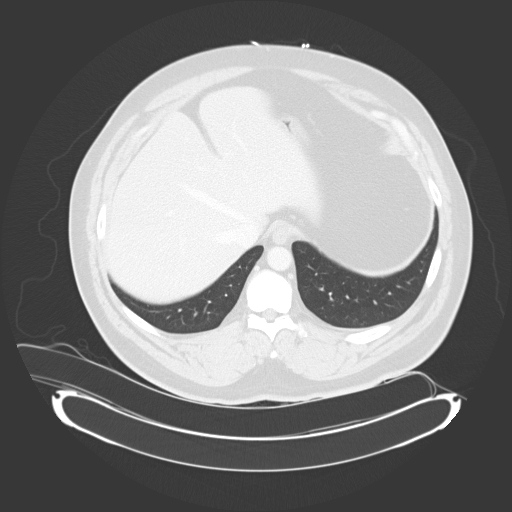
[im 86/106  bone]
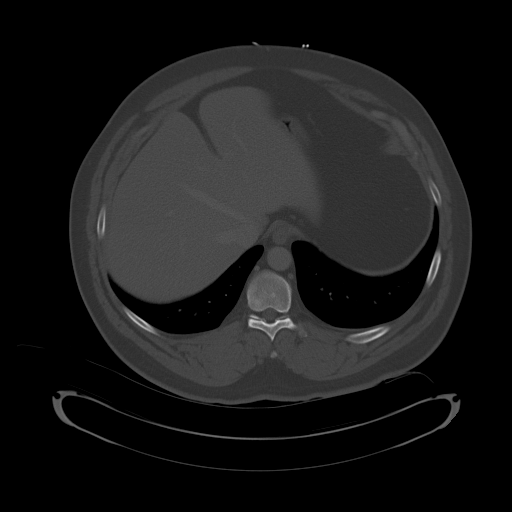
[im 92/106  lung]
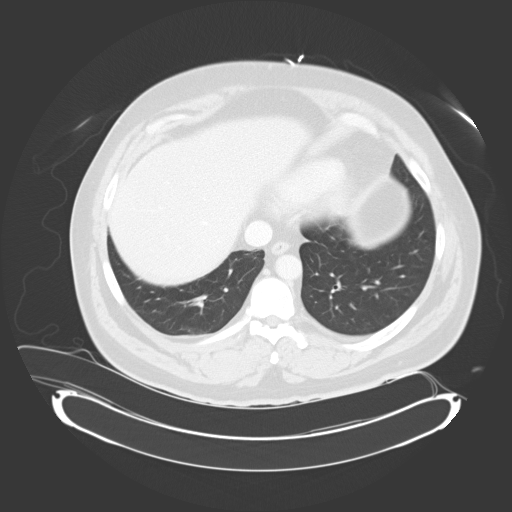
[im 99/106  soft-tissue]
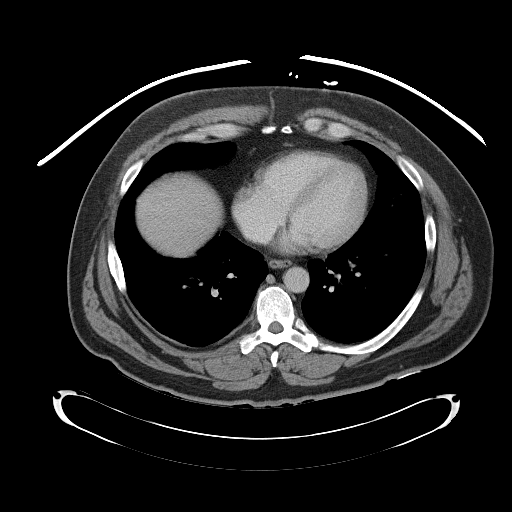
[im 99/106  lung]
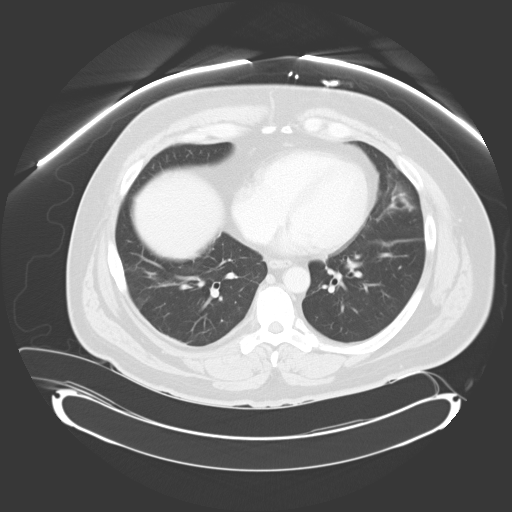

[Series 4: mpr cor post contrast (id) · coronal · 0.97mm/px · 3 of 109 slices shown]
[im 28/109  soft-tissue]
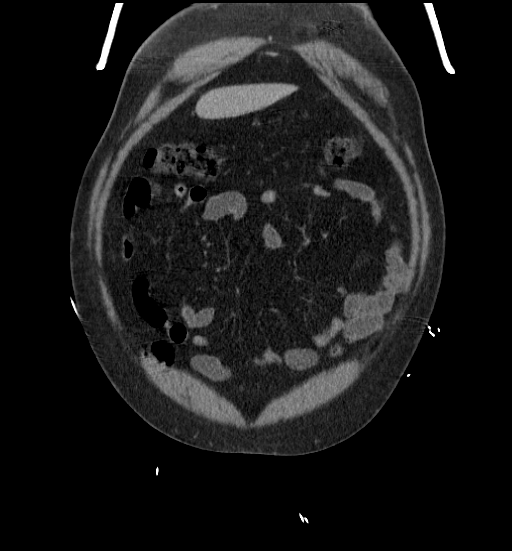
[im 55/109  soft-tissue]
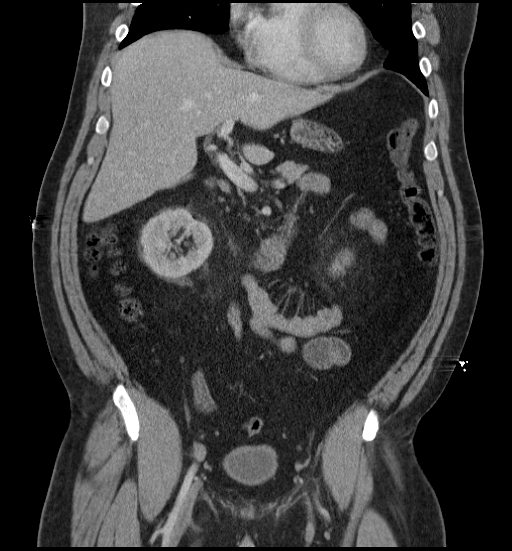
[im 82/109  soft-tissue]
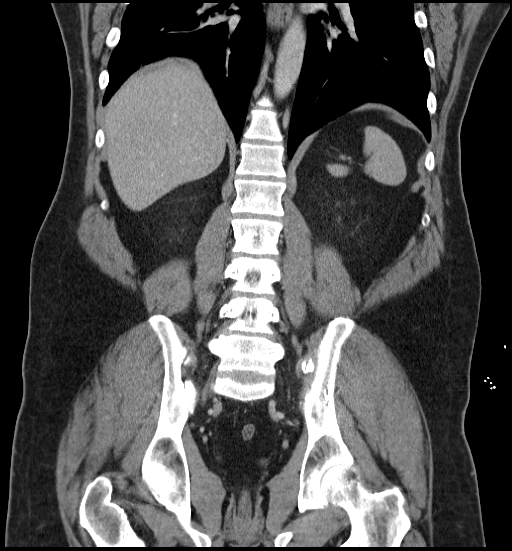

[14 of 46 positions shown; findings below may reference images not displayed]

FINDINGS: There is significant stranding surrounding the kidneys
bilaterally.  There is stranding surrounding both ureters but no
evidence for hydronephrosis or ureteral obstruction.  Striated
nephrograms identified on reconstructed delayed images.  Findings
are consistent with pyelonephritis.

No focal abnormalities identified within the liver, spleen,
pancreas, or adrenal glands.  There are small inner aortic caval
nodes.  The appendix is not well seen.  No inflammatory changes
identified however to suggest presence of acute appendicitis.  The
urinary bladder is normal in appearance.  There is no free pelvic
fluid.  Small inguinal lymph nodes are nonspecific in appearance.

Bowel loops are normal in caliber and wall thickness.
IMPRESSION: Significant stranding surrounding both kidneys and ureters.  The
findings are consistent with acute pyelonephritis.  Retroperitoneal
lymph nodes are likely reactive.

## 2011-04-13 ENCOUNTER — Other Ambulatory Visit: Payer: Self-pay

## 2011-04-13 MED ORDER — OXYCODONE-ACETAMINOPHEN 10-325 MG PO TABS: 1.0000 | ORAL_TABLET | Freq: Four times a day (QID) | ORAL | Status: DC | PRN

## 2011-04-27 ENCOUNTER — Ambulatory Visit (HOSPITAL_COMMUNITY): Payer: Medicare Other | Admitting: Psychiatry

## 2011-04-27 ENCOUNTER — Other Ambulatory Visit: Payer: Self-pay | Admitting: Family Medicine

## 2011-05-01 ENCOUNTER — Other Ambulatory Visit: Payer: Self-pay | Admitting: Family Medicine

## 2011-05-01 NOTE — Telephone Encounter (Signed)
Do you want to refill gabapentin?

## 2011-05-01 NOTE — Telephone Encounter (Signed)
Med refilled.

## 2011-05-01 NOTE — Telephone Encounter (Signed)
pls refill x 2 

## 2011-05-14 ENCOUNTER — Other Ambulatory Visit: Payer: Self-pay

## 2011-05-14 MED ORDER — OXYCODONE-ACETAMINOPHEN 10-325 MG PO TABS: 1.0000 | ORAL_TABLET | Freq: Four times a day (QID) | ORAL | Status: DC | PRN

## 2011-05-24 ENCOUNTER — Encounter (HOSPITAL_COMMUNITY): Payer: Self-pay | Admitting: Psychiatry

## 2011-05-24 ENCOUNTER — Ambulatory Visit (INDEPENDENT_AMBULATORY_CARE_PROVIDER_SITE_OTHER): Payer: Medicare Other | Admitting: Psychiatry

## 2011-05-24 VITALS — Wt 337.0 lb

## 2011-05-24 DIAGNOSIS — F319 Bipolar disorder, unspecified: Secondary | ICD-10-CM

## 2011-05-24 DIAGNOSIS — F1011 Alcohol abuse, in remission: Secondary | ICD-10-CM

## 2011-05-24 MED ORDER — ALPRAZOLAM 1 MG PO TABS: 1.0000 mg | ORAL_TABLET | Freq: Three times a day (TID) | ORAL | Status: DC | PRN

## 2011-05-24 MED ORDER — HALOPERIDOL 20 MG PO TABS: 20.0000 mg | ORAL_TABLET | Freq: Every day | ORAL | Status: DC

## 2011-05-24 MED ORDER — DISULFIRAM 250 MG PO TABS: 250.0000 mg | ORAL_TABLET | Freq: Every day | ORAL | Status: DC

## 2011-05-24 MED ORDER — BENZTROPINE MESYLATE 1 MG PO TABS: 1.0000 mg | ORAL_TABLET | Freq: Every day | ORAL | Status: DC

## 2011-05-24 NOTE — Progress Notes (Signed)
Chief complaint Medication management and followup.  History of present illness Patient is 46 year old male who came for his followup appointment.  He is been compliant with his medication and reported no side effects.  Recently he having shoulder pain and seen his primary care physician .  He is taking pain medication and feeling better .  Overall his mood has been better and he denies any agitation anger or paranoia .  Since we have reduce his trazodone and Haldol he is feeling more alert and less complain of sedation dry mouth during the day .  He denies any hallucination paranoia or any depressive thoughts.  He likes to continue his current medication and therapy session in this office .  He is scheduled to see his primary care physician again in 3 weeks .  He is not drinking or using drugs .  He likes Antabuse which is helping a lot .  He is also relief as her son is moving out with his girlfriend .  He denies any tremors shakes or any side effects.  Current psychiatric medication Xanax 1 mg 3 times a day and fourth as needed  Trazodone 150 mg at bedtime  Haldol 20 mg at bedtime Cogentin 1 mg at bedtime Antabuse 250 mg daily He is taking propranolol and Neurontin from his primary care physician.  Mental status examination Patient is casually dressed with fair eye contact. He is complain of shoulder pain but he is.  cooperative and pleasant. His thought process is slow but logical linear and goal-directed. He denies any auditory hallucinations suicidal thoughts or homicidal thoughts. There are no psychotic symptoms present at this time. There is no delusions obsessions or grandiosity present at this time. His attention and concentration is fair. He described his mood is good and his affect is improved from past. He's alert and oriented x3. His speech is slow and coherent. He he reported no side effects of medication including tremors shakes or extrapyramidal side effects. His insight judgment and  impulse control is okay.  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  Alcohol abuse in partial remission,  rule out bipolar disorder with psychotic features. Axis II deferred Axis III see medical history Axis IV mild to moderate  Plan I will continue his Xanax 1 mg 3 times a day and 4th only as needed, Haldol 20 mg at bedtime, Antabuse 250 mg daily, trazodone 150 mg 1 tablet at bedtime for sleep, Cogentin 1 mg at bedtime. Patient will get his Inderal from his primary care physician. I explained risks and benefits of medication in detail and I will see him again in 2 months. He will continue to see therapist for increase coping and social skills.

## 2011-05-28 ENCOUNTER — Other Ambulatory Visit: Payer: Self-pay | Admitting: Family Medicine

## 2011-05-29 ENCOUNTER — Other Ambulatory Visit (HOSPITAL_COMMUNITY): Payer: Self-pay | Admitting: Psychiatry

## 2011-05-29 ENCOUNTER — Other Ambulatory Visit: Payer: Self-pay

## 2011-05-29 DIAGNOSIS — R197 Diarrhea, unspecified: Secondary | ICD-10-CM

## 2011-05-29 DIAGNOSIS — F329 Major depressive disorder, single episode, unspecified: Secondary | ICD-10-CM

## 2011-05-29 MED ORDER — TRAZODONE HCL 150 MG PO TABS: 150.0000 mg | ORAL_TABLET | Freq: Every day | ORAL | Status: DC

## 2011-05-30 ENCOUNTER — Ambulatory Visit (HOSPITAL_COMMUNITY): Payer: Medicare Other | Admitting: Psychiatry

## 2011-05-30 MED ORDER — DICYCLOMINE HCL 10 MG PO CAPS: 20.0000 mg | ORAL_CAPSULE | Freq: Three times a day (TID) | ORAL | Status: DC

## 2011-05-30 NOTE — Telephone Encounter (Signed)
i will refill this but we need pt to come in to reevaluate diarrhea.

## 2011-05-30 NOTE — Telephone Encounter (Signed)
Made Pt an appointment for May 2 at 2:00 with [facility] for more refills.

## 2011-06-08 ENCOUNTER — Other Ambulatory Visit: Payer: Self-pay

## 2011-06-08 MED ORDER — OXYCODONE-ACETAMINOPHEN 10-325 MG PO TABS: 1.0000 | ORAL_TABLET | Freq: Four times a day (QID) | ORAL | Status: DC | PRN

## 2011-06-12 ENCOUNTER — Telehealth: Payer: Self-pay | Admitting: Family Medicine

## 2011-06-12 NOTE — Telephone Encounter (Signed)
Letter written signed and faxed

## 2011-06-12 NOTE — Telephone Encounter (Signed)
Needs letter sent to Social services in [place] that it is medically necessary for him to come to [place] to see dr simpson instead of Doc closer by. That you are also his pain management doctor and that it was medically necessary for him to come here monthly to collect his pain prescription. Needs this asap sent to them so he can continue to get transportation here.   [phone] fax  Attn Transportation

## 2011-06-13 ENCOUNTER — Ambulatory Visit (INDEPENDENT_AMBULATORY_CARE_PROVIDER_SITE_OTHER): Payer: Medicare Other | Admitting: Family Medicine

## 2011-06-13 ENCOUNTER — Encounter: Payer: Self-pay | Admitting: Family Medicine

## 2011-06-13 VITALS — BP 132/76 | HR 95 | Resp 18 | Ht 73.0 in | Wt 339.1 lb

## 2011-06-13 DIAGNOSIS — M542 Cervicalgia: Secondary | ICD-10-CM | POA: Insufficient documentation

## 2011-06-13 DIAGNOSIS — R7309 Other abnormal glucose: Secondary | ICD-10-CM

## 2011-06-13 DIAGNOSIS — J449 Chronic obstructive pulmonary disease, unspecified: Secondary | ICD-10-CM

## 2011-06-13 DIAGNOSIS — I10 Essential (primary) hypertension: Secondary | ICD-10-CM

## 2011-06-13 DIAGNOSIS — Z79891 Long term (current) use of opiate analgesic: Secondary | ICD-10-CM

## 2011-06-13 DIAGNOSIS — K219 Gastro-esophageal reflux disease without esophagitis: Secondary | ICD-10-CM

## 2011-06-13 DIAGNOSIS — R7303 Prediabetes: Secondary | ICD-10-CM

## 2011-06-13 DIAGNOSIS — F17200 Nicotine dependence, unspecified, uncomplicated: Secondary | ICD-10-CM

## 2011-06-13 DIAGNOSIS — M25511 Pain in right shoulder: Secondary | ICD-10-CM | POA: Insufficient documentation

## 2011-06-13 DIAGNOSIS — M25519 Pain in unspecified shoulder: Secondary | ICD-10-CM

## 2011-06-13 MED ORDER — METHYLPREDNISOLONE ACETATE 80 MG/ML IJ SUSP
80.0000 mg | Freq: Once | INTRAMUSCULAR | Status: AC
  Administered 2011-06-13: 80 mg via INTRAMUSCULAR

## 2011-06-13 MED ORDER — KETOROLAC TROMETHAMINE 60 MG/2ML IJ SOLN
60.0000 mg | Freq: Once | INTRAMUSCULAR | Status: AC
  Administered 2011-06-13: 60 mg via INTRAMUSCULAR

## 2011-06-13 NOTE — Progress Notes (Signed)
  Subjective:    Patient ID: Gavin Gray, male    DOB: 07-28-1965, 46 y.o.   MRN: [id number]  HPI The PT is here for follow up and re-evaluation of chronic medical conditions, medication management and review of any available recent lab and radiology data.  Preventive health is updated, specifically  Cancer screening and Immunization.   Questions or concerns regarding consultations or procedures which the PT has had in the interim are  addressed. The PT denies any adverse reactions to current medications since the last visit.  Pt states he fell 6 to 8  weeks ago, since then he has had  weakness in the right hand with tingling  Down the right upper ext and shoulder to the hand, also marked limitation in movement of the right shoulder.Has had injection from ortho no relief     Review of Systems See HPI Denies recent fever or chills. Denies sinus pressure, nasal congestion, ear pain or sore throat. Denies chest congestion, productive cough or wheezing. Denies chest pains, palpitations and leg swelling Denies abdominal pain, nausea, vomiting,diarrhea or constipation.   Denies dysuria, frequency, hesitancy or incontinence. Denies headaches, seizures,  Denies uncontrolled depression, anxiety or insomnia. Denies skin break down or rash.        Objective:   Physical Exam .Patient alert and oriented and in no cardiopulmonary distress.  HEENT: No facial asymmetry, EOMI, no sinus tenderness,  oropharynx pink and moist.  Neck decreased ROM with trapezius spasm, no adenopathy.  Chest: Clear to auscultation bilaterally.Decreased air entry  CVS: S1, S2 no murmurs, no S3.  ABD: Soft non tender. Bowel sounds normal.  Ext: No edema  MS: decreased  ROM spine and right  Shoulders,adequate in  hips and knees.  Skin: Intact, no ulcerations or rash noted.  Psych: Good eye contact,blunted affect. Memory intact not anxious or depressed appearing.  CNS: CN 2-12 intact, power,  normal  throughout.Decreased sensation in right upper extremity        Assessment & Plan:

## 2011-06-13 NOTE — Patient Instructions (Addendum)
F/u in 4 month  Please cut back on cigarettes, you need to quit    Labs today   You are being referred for an MRI of your neck and right shoulder. You will receive toradol  And depo medrol in the office today for neck and shoulder pain  Please work on weight loss through diet and exercise .  We will refill dicylomine

## 2011-06-14 LAB — COMPREHENSIVE METABOLIC PANEL
ALT: 30 U/L (ref 0–53)
CO2: 23 mEq/L (ref 19–32)
Calcium: 8.8 mg/dL (ref 8.4–10.5)
Chloride: 100 mEq/L (ref 96–112)
Sodium: 138 mEq/L (ref 135–145)
Total Protein: 7 g/dL (ref 6.0–8.3)

## 2011-06-14 LAB — DRUG SCREEN, URINE
Amphetamine Screen, Ur: NEGATIVE
Benzodiazepines.: POSITIVE — AB
Marijuana Metabolite: NEGATIVE
Phencyclidine (PCP): NEGATIVE

## 2011-06-14 LAB — HEMOGLOBIN A1C: Mean Plasma Glucose: 120 mg/dL — ABNORMAL HIGH (ref <117)

## 2011-06-19 ENCOUNTER — Other Ambulatory Visit: Payer: Self-pay | Admitting: Family Medicine

## 2011-06-20 ENCOUNTER — Encounter (HOSPITAL_COMMUNITY): Payer: Self-pay | Admitting: Psychiatry

## 2011-06-20 ENCOUNTER — Ambulatory Visit (INDEPENDENT_AMBULATORY_CARE_PROVIDER_SITE_OTHER): Payer: Medicare Other | Admitting: Psychiatry

## 2011-06-20 DIAGNOSIS — F331 Major depressive disorder, recurrent, moderate: Secondary | ICD-10-CM

## 2011-06-20 NOTE — Patient Instructions (Signed)
Discussed orally 

## 2011-06-20 NOTE — Progress Notes (Signed)
Patient:  Gavin Gray   DOB: 04/05/1965  MR Number: [id number]  Location: [facility]:  [address] [place] [place],  [place], [address]  Start:  Wednesday 06/20/2011  1:05 PM End:  Wednesday 06/20/2011  2:00 PM  Provider/Observer:     Florencia Reasons, MSW, LCSW   Chief Complaint:      Chief Complaint  Patient presents with  . Depression  . Anxiety    Reason For Service:     The patient initially was referred for services upon his discharge from the [facility] where he was treated for depression and suicidal ideations. Patient has a long-standing history of recurrent periods of depression and anxiety. Patient is seen for a follow up appointment today.  Interventions Strategy:  Supportive therapy, cognitive behavioral therapy  Participation Level:   Active  Participation Quality:  Appropriate      Behavioral Observation:  Fairly Groomed, Alert, and anxious  Current Psychosocial Factors: The patient reports recent conflict with his girlfriend. The patient expresses frustration as he reports his adult children often depend on him financially. Patient also continues to experience stress related to his mother who has critical health issues.  Content of Session:   Reviewing symptoms, processing feelings, identifying ways to set and maintain boundaries in the relationship with his children, reviewing relaxation techniques  Current Status:   Patient reports continued anxiety.The patient also continues to report hallucinations but reports no command hallucinations and no suicidal or homicidal ideations.  Patient Progress:   Fair.  Patient reports continued stress and becoming easily agitated. He expresses frustration as he has suffered pneumonia and recently fell injuring his shoulder. Patient now is experiencing increased pain as well as decreased use of both his arms. He expresses frustration with his adult children as they still make request of patient that are  physically demanding. Patient reports difficulty saying no to children do to guilt he experiences about not being there when they were younger.  He goes on to say that he shouldn't feel guilty as his daughter lies led to his children's situation. Patient also expresses frustration with girlfriend (ex-wife) as she told him she had an affair throughout their 22 year marriage. Patient reports he doesn't trust her but is committed to working on their relationship      Impression/Diagnosis:   The patient has a long-standing history of recurrent periods of depression along with anxiety and panic attacks. Diagnoses: Maj. depressive disorder, anxiety disorder NOS  Diagnosis:  Axis I:  1. Major depressive disorder, recurrent, moderate             Axis II: Deferred

## 2011-06-21 ENCOUNTER — Telehealth: Payer: Self-pay | Admitting: Family Medicine

## 2011-06-21 DIAGNOSIS — M1710 Unilateral primary osteoarthritis, unspecified knee: Secondary | ICD-10-CM

## 2011-06-21 MED ORDER — DICLOFENAC SODIUM 75 MG PO TBEC: 75.0000 mg | DELAYED_RELEASE_TABLET | Freq: Two times a day (BID) | ORAL | Status: DC

## 2011-06-21 NOTE — Telephone Encounter (Signed)
Med sent in.

## 2011-06-22 ENCOUNTER — Ambulatory Visit (HOSPITAL_COMMUNITY)
Admission: RE | Admit: 2011-06-22 | Discharge: 2011-06-22 | Disposition: A | Discharge status: Home / Self Care | Payer: Medicare Other | Source: Ambulatory Visit | Attending: Family Medicine | Admitting: Family Medicine

## 2011-06-22 DIAGNOSIS — M542 Cervicalgia: Secondary | ICD-10-CM

## 2011-06-22 DIAGNOSIS — M25511 Pain in right shoulder: Secondary | ICD-10-CM

## 2011-06-27 ENCOUNTER — Encounter: Payer: Self-pay | Admitting: Internal Medicine

## 2011-06-28 ENCOUNTER — Ambulatory Visit: Payer: Self-pay | Admitting: Gastroenterology

## 2011-06-28 ENCOUNTER — Telehealth: Payer: Self-pay | Admitting: Gastroenterology

## 2011-06-28 ENCOUNTER — Telehealth: Payer: Self-pay | Admitting: Family Medicine

## 2011-06-28 NOTE — Telephone Encounter (Signed)
Pt was a no show

## 2011-06-28 NOTE — Telephone Encounter (Signed)
This pt has appt at [facility] for 07/06/2011. Called pt but number has been disconnected

## 2011-06-30 ENCOUNTER — Other Ambulatory Visit: Payer: Self-pay | Admitting: Family Medicine

## 2011-07-01 NOTE — Assessment & Plan Note (Signed)
Controlled, no change in medication  

## 2011-07-01 NOTE — Assessment & Plan Note (Signed)
Right upper ext weakness and numbness with inc neck pain following fall ,needs MRI

## 2011-07-01 NOTE — Assessment & Plan Note (Signed)
deteriorating with ongoing nicotine use
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
## 2011-07-01 NOTE — Assessment & Plan Note (Signed)
Need [pdated lab, counseled re need to lose weight to avert diabetes

## 2011-07-01 NOTE — Assessment & Plan Note (Signed)
unchanged

## 2011-07-04 ENCOUNTER — Ambulatory Visit (INDEPENDENT_AMBULATORY_CARE_PROVIDER_SITE_OTHER): Payer: Medicare Other | Admitting: Psychiatry

## 2011-07-04 DIAGNOSIS — F419 Anxiety disorder, unspecified: Secondary | ICD-10-CM

## 2011-07-04 DIAGNOSIS — F331 Major depressive disorder, recurrent, moderate: Secondary | ICD-10-CM

## 2011-07-04 DIAGNOSIS — F411 Generalized anxiety disorder: Secondary | ICD-10-CM

## 2011-07-04 NOTE — Patient Instructions (Signed)
Discussed orally 

## 2011-07-04 NOTE — Progress Notes (Signed)
Patient:  Gavin Gray   DOB: 16-Mar-1965  MR Number: [id number]  Location: [facility]:  [address] [place] [place],  [place], [address]  Start:  Wednesday 07/04/2011 2:25 PM End:  Wednesday 07/04/2011 3:15 PM  Provider/Observer:     Florencia Reasons, MSW, LCSW   Chief Complaint:      Chief Complaint  Patient presents with  . Depression  . Anxiety    Reason For Service:     The patient initially was referred for services upon his discharge from the [facility] where he was treated for depression and suicidal ideations. Patient has a long-standing history of recurrent periods of depression and anxiety. Patient is seen for a follow up appointment today.  Interventions Strategy:  Supportive therapy, cognitive behavioral therapy  Participation Level:   Active  Participation Quality:  Appropriate      Behavioral Observation:  Fairly Groomed, Alert, and anxious  Current Psychosocial Factors:  Patient continues to experience stress related to his mother as her health has worsened in the past 2 weeks.  Patient reports discord with siblings.  Content of Session:   Reviewing symptoms, processing feelings, identifying ways to improve assertiveness skills, reviewing relaxation techniques  Current Status:   Patient reports increased anxiety, irritability, and agitation.   Patient Progress:   Fair.  Patient reports continued stress and becoming easily agitated often yelling at his girlfriend.  He expresses frustration due to multiple stressors.  He is angry that his daughter has been lying to him and angry that his children have not visited his mother as her health is steadily declining.  He fears mother will die soon and is worried how this may affect his father. He also is angry that two of his sisters haven't been regularly visiting his mother. Therapist works with patient to improve assertiveness skills, process feelings and review relaxation techniques.      Impression/Diagnosis:   The patient has a long-standing history of recurrent periods of depression along with anxiety and panic attacks. Diagnoses: Maj. depressive disorder, anxiety disorder NOS  Diagnosis:  Axis I:  1. Major depressive disorder, recurrent, moderate   2. Anxiety disorder             Axis II: Deferred

## 2011-07-06 ENCOUNTER — Other Ambulatory Visit: Payer: Self-pay

## 2011-07-06 MED ORDER — OXYCODONE-ACETAMINOPHEN 10-325 MG PO TABS: 1.0000 | ORAL_TABLET | Freq: Four times a day (QID) | ORAL | Status: DC | PRN

## 2011-07-09 ENCOUNTER — Telehealth: Payer: Self-pay | Admitting: Family Medicine

## 2011-07-09 ENCOUNTER — Other Ambulatory Visit: Payer: Self-pay | Admitting: Family Medicine

## 2011-07-09 DIAGNOSIS — M25511 Pain in right shoulder: Secondary | ICD-10-CM

## 2011-07-09 NOTE — Telephone Encounter (Signed)
No results back as of yet

## 2011-07-09 NOTE — Telephone Encounter (Signed)
pls refer to dr Hilda Lias eval and treat right shoulder pain, pls fax mri reports of shoulder and neck in your area

## 2011-07-10 NOTE — Telephone Encounter (Signed)
Pt was referred to dr. Hilda Lias office. They will call pt with appt and time.

## 2011-07-20 ENCOUNTER — Other Ambulatory Visit (HOSPITAL_COMMUNITY): Payer: Self-pay | Admitting: *Deleted

## 2011-07-20 ENCOUNTER — Other Ambulatory Visit: Payer: Self-pay | Admitting: Family Medicine

## 2011-07-20 DIAGNOSIS — F319 Bipolar disorder, unspecified: Secondary | ICD-10-CM

## 2011-07-20 DIAGNOSIS — F329 Major depressive disorder, single episode, unspecified: Secondary | ICD-10-CM

## 2011-07-20 DIAGNOSIS — F1011 Alcohol abuse, in remission: Secondary | ICD-10-CM

## 2011-07-23 ENCOUNTER — Other Ambulatory Visit (HOSPITAL_COMMUNITY): Payer: Self-pay | Admitting: Psychiatry

## 2011-07-24 ENCOUNTER — Encounter (HOSPITAL_COMMUNITY): Payer: Self-pay | Admitting: Psychiatry

## 2011-07-24 ENCOUNTER — Ambulatory Visit (INDEPENDENT_AMBULATORY_CARE_PROVIDER_SITE_OTHER): Payer: Medicare Other | Admitting: Psychiatry

## 2011-07-24 VITALS — Wt 337.0 lb

## 2011-07-24 DIAGNOSIS — F319 Bipolar disorder, unspecified: Secondary | ICD-10-CM

## 2011-07-24 MED ORDER — ALPRAZOLAM 1 MG PO TABS: 1.0000 mg | ORAL_TABLET | Freq: Three times a day (TID) | ORAL | Status: DC | PRN

## 2011-07-24 MED ORDER — BENZTROPINE MESYLATE 2 MG PO TABS: 2.0000 mg | ORAL_TABLET | Freq: Every day | ORAL | Status: DC

## 2011-07-24 MED ORDER — TRAZODONE HCL 150 MG PO TABS: 150.0000 mg | ORAL_TABLET | Freq: Every day | ORAL | Status: DC

## 2011-07-24 MED ORDER — DISULFIRAM 250 MG PO TABS: 250.0000 mg | ORAL_TABLET | Freq: Every day | ORAL | Status: DC

## 2011-07-24 MED ORDER — HALOPERIDOL 20 MG PO TABS: 20.0000 mg | ORAL_TABLET | Freq: Every day | ORAL | Status: DC

## 2011-07-24 MED ORDER — DIVALPROEX SODIUM ER 250 MG PO TB24: 250.0000 mg | ORAL_TABLET | Freq: Every day | ORAL | Status: DC
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
## 2011-07-27 ENCOUNTER — Ambulatory Visit (HOSPITAL_COMMUNITY): Payer: Self-pay | Admitting: Psychiatry

## 2011-08-10 ENCOUNTER — Other Ambulatory Visit: Payer: Self-pay

## 2011-08-10 MED ORDER — OXYCODONE-ACETAMINOPHEN 10-325 MG PO TABS: 1.0000 | ORAL_TABLET | Freq: Four times a day (QID) | ORAL | Status: DC | PRN

## 2011-08-20 ENCOUNTER — Other Ambulatory Visit: Payer: Self-pay

## 2011-08-20 ENCOUNTER — Other Ambulatory Visit (HOSPITAL_COMMUNITY): Payer: Self-pay | Admitting: *Deleted

## 2011-08-20 ENCOUNTER — Other Ambulatory Visit (HOSPITAL_COMMUNITY): Payer: Self-pay | Admitting: Psychiatry

## 2011-08-20 DIAGNOSIS — F329 Major depressive disorder, single episode, unspecified: Secondary | ICD-10-CM

## 2011-08-20 MED ORDER — DEXLANSOPRAZOLE 60 MG PO CPDR
60.0000 mg | DELAYED_RELEASE_CAPSULE | Freq: Every day | ORAL | Status: DC
Start: 2011-08-20 — End: 2011-12-25

## 2011-08-20 MED ORDER — TRAZODONE HCL 150 MG PO TABS: 150.0000 mg | ORAL_TABLET | Freq: Every day | ORAL | Status: DC

## 2011-08-23 ENCOUNTER — Encounter (HOSPITAL_COMMUNITY): Payer: Self-pay | Admitting: Psychiatry

## 2011-08-23 ENCOUNTER — Ambulatory Visit (INDEPENDENT_AMBULATORY_CARE_PROVIDER_SITE_OTHER): Payer: Medicare Other | Admitting: Psychiatry

## 2011-08-23 VITALS — Wt 337.0 lb

## 2011-08-23 DIAGNOSIS — F1010 Alcohol abuse, uncomplicated: Secondary | ICD-10-CM

## 2011-08-23 DIAGNOSIS — F323 Major depressive disorder, single episode, severe with psychotic features: Secondary | ICD-10-CM

## 2011-08-23 DIAGNOSIS — F1011 Alcohol abuse, in remission: Secondary | ICD-10-CM

## 2011-08-23 DIAGNOSIS — F319 Bipolar disorder, unspecified: Secondary | ICD-10-CM

## 2011-08-23 DIAGNOSIS — F329 Major depressive disorder, single episode, unspecified: Secondary | ICD-10-CM

## 2011-08-23 MED ORDER — DISULFIRAM 250 MG PO TABS: 250.0000 mg | ORAL_TABLET | Freq: Every day | ORAL | Status: DC

## 2011-08-23 MED ORDER — BENZTROPINE MESYLATE 2 MG PO TABS: 2.0000 mg | ORAL_TABLET | Freq: Every day | ORAL | Status: DC

## 2011-08-23 MED ORDER — ALPRAZOLAM 1 MG PO TABS: 1.0000 mg | ORAL_TABLET | Freq: Three times a day (TID) | ORAL | Status: DC | PRN

## 2011-08-23 MED ORDER — HALOPERIDOL 20 MG PO TABS: 20.0000 mg | ORAL_TABLET | Freq: Every day | ORAL | Status: DC

## 2011-08-23 MED ORDER — TRAZODONE HCL 150 MG PO TABS: 150.0000 mg | ORAL_TABLET | Freq: Every day | ORAL | Status: DC
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
## 2011-09-06 ENCOUNTER — Telehealth: Payer: Self-pay | Admitting: Family Medicine

## 2011-09-06 MED ORDER — PENICILLIN V POTASSIUM 500 MG PO TABS: 500.0000 mg | ORAL_TABLET | Freq: Three times a day (TID) | ORAL | Status: AC

## 2011-09-06 NOTE — Telephone Encounter (Signed)
pls send in pen V 500mg  three times daily #30 refill zero , let him know he needs dental work

## 2011-09-06 NOTE — Telephone Encounter (Signed)
Patient aware.

## 2011-09-07 ENCOUNTER — Ambulatory Visit (HOSPITAL_COMMUNITY): Payer: Medicare Other | Admitting: Psychiatry

## 2011-09-10 ENCOUNTER — Other Ambulatory Visit: Payer: Self-pay

## 2011-09-10 MED ORDER — FLUTICASONE PROPIONATE 50 MCG/ACT NA SUSP: 1.0000 | Freq: Every day | NASAL | Status: DC

## 2011-09-11 ENCOUNTER — Other Ambulatory Visit: Payer: Self-pay

## 2011-09-11 MED ORDER — OXYCODONE-ACETAMINOPHEN 10-325 MG PO TABS: 1.0000 | ORAL_TABLET | Freq: Four times a day (QID) | ORAL | Status: DC | PRN

## 2011-09-17 ENCOUNTER — Other Ambulatory Visit: Payer: Self-pay | Admitting: Family Medicine

## 2011-10-04 ENCOUNTER — Other Ambulatory Visit: Payer: Self-pay

## 2011-10-04 MED ORDER — OXYCODONE-ACETAMINOPHEN 10-325 MG PO TABS: 1.0000 | ORAL_TABLET | Freq: Four times a day (QID) | ORAL | Status: DC | PRN

## 2011-10-05 ENCOUNTER — Telehealth: Payer: Self-pay | Admitting: Family Medicine

## 2011-10-11 ENCOUNTER — Encounter: Payer: Self-pay | Admitting: Family Medicine

## 2011-10-11 ENCOUNTER — Ambulatory Visit (INDEPENDENT_AMBULATORY_CARE_PROVIDER_SITE_OTHER): Payer: Medicare Other | Admitting: Family Medicine

## 2011-10-11 VITALS — BP 110/74 | HR 91 | Resp 16 | Ht 73.0 in | Wt 351.0 lb

## 2011-10-11 DIAGNOSIS — J302 Other seasonal allergic rhinitis: Secondary | ICD-10-CM

## 2011-10-11 DIAGNOSIS — R5383 Other fatigue: Secondary | ICD-10-CM

## 2011-10-11 DIAGNOSIS — R55 Syncope and collapse: Secondary | ICD-10-CM

## 2011-10-11 DIAGNOSIS — M899 Disorder of bone, unspecified: Secondary | ICD-10-CM

## 2011-10-11 DIAGNOSIS — M549 Dorsalgia, unspecified: Secondary | ICD-10-CM

## 2011-10-11 DIAGNOSIS — I10 Essential (primary) hypertension: Secondary | ICD-10-CM

## 2011-10-11 DIAGNOSIS — Z125 Encounter for screening for malignant neoplasm of prostate: Secondary | ICD-10-CM

## 2011-10-11 DIAGNOSIS — E785 Hyperlipidemia, unspecified: Secondary | ICD-10-CM

## 2011-10-11 DIAGNOSIS — J449 Chronic obstructive pulmonary disease, unspecified: Secondary | ICD-10-CM

## 2011-10-11 DIAGNOSIS — F411 Generalized anxiety disorder: Secondary | ICD-10-CM

## 2011-10-11 DIAGNOSIS — F17200 Nicotine dependence, unspecified, uncomplicated: Secondary | ICD-10-CM

## 2011-10-11 DIAGNOSIS — F319 Bipolar disorder, unspecified: Secondary | ICD-10-CM

## 2011-10-11 NOTE — Patient Instructions (Addendum)
Annual wellness  in 3 month  You are being referred to mental health [facility] for treatment per routine.    You are referred to neurologist re passing out spells  Fasting lipid, cmp, tSH and pSA and vit D in 3 month   You will get info on 'heart healthy diet" and will be referred to dietitian

## 2011-10-11 NOTE — Progress Notes (Signed)
  Subjective:    Patient ID: Gavin Gray, male    DOB: [birth year], 46 y.o.   MRN: [id number]  HPI The PT is here for follow up and re-evaluation of chronic medical conditions, medication management and review of any available recent lab and radiology data.  Preventive health is updated, specifically  Cancer screening and Immunization.   Questions or concerns regarding consultations or procedures which the PT has had in the interim are  addressed. Pt states since last Novemeber he loses conciousness on average every day after early aftermoon meal for approx 45 minutes, he has been told he jerks, he denies incontinence of stool or urine. Denies any associated chest pain, or palpitations. His mother suffers from seizures. He has been unable to see his therapist for 3 months, needs referral for same with change in health care reportedly, tearful, anxious about being able to see his psychiatrist and therapist     Review of Systems See HPI Denies recent fever or chills. Denies sinus pressure, nasal congestion, ear pain or sore throat. Denies chest congestion, productive cough or wheezing. Denies chest pains, palpitations and leg swelling Denies abdominal pain, nausea, vomiting,diarrhea or constipation.   Denies dysuria, frequency, hesitancy or incontinence. Denies joint pain, swelling and limitation in mobility. Denies headaches, seizures, numbness, or tingling.  Denies skin break down or rash.        Objective:   Physical Exam Patient alert and oriented and in no cardiopulmonary distress.  HEENT: No facial asymmetry, EOMI, no sinus tenderness,  oropharynx pink and moist.  Neck supple no adenopathy.  Chest: Clear to auscultation bilaterally.Decreased throughout  CVS: S1, S2 no murmurs, no S3.  ABD: Soft non tender. Bowel sounds normal.  Ext: No edema  MS: Adequate ROM spine, shoulders, hips and knees.  Skin: Intact, no ulcerations or rash noted.  Psych: Good eye  contact, tearful  affect. Memory intact anxious and  depressed appearing.  CNS: CN 2-12 intact, power, tone and sensation normal throughout.        Assessment & Plan:

## 2011-10-12 ENCOUNTER — Ambulatory Visit (HOSPITAL_COMMUNITY): Payer: Self-pay | Admitting: Psychiatry

## 2011-10-12 NOTE — Assessment & Plan Note (Signed)
Progressing since he continues to smoke  Patient counseled for approximately 5 minutes regarding the health risks of ongoing nicotine use, specifically all types of cancer, heart disease, stroke and respiratory failure. The options available for help with cessation ,the behavioral changes to assist the process, and the option to either gradully reduce usage  Or abruptly stop.is also discussed. Pt is also encouraged to set specific goals in number of cigarettes used daily, as well as to set a quit date.

## 2011-10-12 NOTE — Assessment & Plan Note (Signed)
Increased at this time, needs to re establish care with therapist

## 2011-10-12 NOTE — Assessment & Plan Note (Signed)
Unchanged, cessation counseling done, no commitment to quitting

## 2011-10-12 NOTE — Addendum Note (Signed)
Addended by: Abner Greenspan on: 10/12/2011 11:23 AM   Modules accepted: Orders

## 2011-10-12 NOTE — Assessment & Plan Note (Signed)
Hyperlipidemia:Low fat diet discussed and encouraged.  Updated labs needed 

## 2011-10-12 NOTE — Assessment & Plan Note (Signed)
Controlled, no change in medication  

## 2011-10-12 NOTE — Assessment & Plan Note (Signed)
8 month h/o daily syncopal events with jerking primarily after a meal, refer for neurologic eval for seizure disorder

## 2011-10-12 NOTE — Assessment & Plan Note (Signed)
Controlled, no change in medication DASH diet and commitment to daily physical activity for a minimum of 30 minutes discussed and encouraged, as a part of hypertension management. The importance of attaining a healthy weight is also discussed.  

## 2011-10-12 NOTE — Telephone Encounter (Signed)
Patient came in for appt yesterday.

## 2011-10-12 NOTE — Assessment & Plan Note (Signed)
Unchanged, continue pain management as before

## 2011-10-23 ENCOUNTER — Other Ambulatory Visit: Payer: Self-pay

## 2011-10-23 MED ORDER — PROPRANOLOL HCL 20 MG PO TABS: ORAL_TABLET | ORAL | Status: DC

## 2011-10-23 MED ORDER — PROAIR HFA 108 (90 BASE) MCG/ACT IN AERS: INHALATION_SPRAY | RESPIRATORY_TRACT | Status: DC

## 2011-10-23 MED ORDER — RANITIDINE HCL 150 MG PO TABS: ORAL_TABLET | ORAL | Status: DC

## 2011-10-23 MED ORDER — AMLODIPINE BESY-BENAZEPRIL HCL 10-40 MG PO CAPS: ORAL_CAPSULE | ORAL | Status: DC

## 2011-10-30 ENCOUNTER — Other Ambulatory Visit (HOSPITAL_COMMUNITY): Payer: Self-pay | Admitting: *Deleted

## 2011-10-30 ENCOUNTER — Other Ambulatory Visit: Payer: Self-pay

## 2011-10-30 DIAGNOSIS — F319 Bipolar disorder, unspecified: Secondary | ICD-10-CM

## 2011-10-30 MED ORDER — GABAPENTIN 400 MG PO CAPS: ORAL_CAPSULE | ORAL | Status: DC

## 2011-10-30 MED ORDER — ALPRAZOLAM 1 MG PO TABS: 1.0000 mg | ORAL_TABLET | Freq: Three times a day (TID) | ORAL | Status: DC | PRN

## 2011-11-02 ENCOUNTER — Other Ambulatory Visit: Payer: Self-pay

## 2011-11-02 MED ORDER — OXYCODONE-ACETAMINOPHEN 10-325 MG PO TABS: 1.0000 | ORAL_TABLET | Freq: Four times a day (QID) | ORAL | Status: DC | PRN

## 2011-11-17 ENCOUNTER — Other Ambulatory Visit: Payer: Self-pay | Admitting: Family Medicine

## 2011-11-19 ENCOUNTER — Other Ambulatory Visit (HOSPITAL_COMMUNITY): Payer: Self-pay | Admitting: *Deleted

## 2011-11-19 DIAGNOSIS — F1011 Alcohol abuse, in remission: Secondary | ICD-10-CM

## 2011-11-19 DIAGNOSIS — F319 Bipolar disorder, unspecified: Secondary | ICD-10-CM

## 2011-11-19 MED ORDER — HALOPERIDOL 20 MG PO TABS: 20.0000 mg | ORAL_TABLET | Freq: Every day | ORAL | Status: DC

## 2011-11-19 MED ORDER — BENZTROPINE MESYLATE 2 MG PO TABS: 2.0000 mg | ORAL_TABLET | Freq: Every day | ORAL | Status: DC

## 2011-11-19 MED ORDER — DISULFIRAM 250 MG PO TABS: 250.0000 mg | ORAL_TABLET | Freq: Every day | ORAL | Status: DC

## 2011-11-22 ENCOUNTER — Encounter (HOSPITAL_COMMUNITY): Payer: Self-pay | Admitting: Psychiatry

## 2011-11-22 ENCOUNTER — Ambulatory Visit (INDEPENDENT_AMBULATORY_CARE_PROVIDER_SITE_OTHER): Payer: Medicare Other | Admitting: Psychiatry

## 2011-11-22 VITALS — Wt 353.0 lb

## 2011-11-22 DIAGNOSIS — F1010 Alcohol abuse, uncomplicated: Secondary | ICD-10-CM

## 2011-11-22 DIAGNOSIS — F329 Major depressive disorder, single episode, unspecified: Secondary | ICD-10-CM

## 2011-11-22 DIAGNOSIS — F319 Bipolar disorder, unspecified: Secondary | ICD-10-CM

## 2011-11-22 DIAGNOSIS — F323 Major depressive disorder, single episode, severe with psychotic features: Secondary | ICD-10-CM

## 2011-11-22 MED ORDER — ALPRAZOLAM 1 MG PO TABS: 1.0000 mg | ORAL_TABLET | Freq: Three times a day (TID) | ORAL | Status: DC | PRN

## 2011-11-22 MED ORDER — TRAZODONE HCL 150 MG PO TABS: 150.0000 mg | ORAL_TABLET | Freq: Every day | ORAL | Status: DC
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
## 2011-11-30 ENCOUNTER — Other Ambulatory Visit: Payer: Self-pay

## 2011-11-30 MED ORDER — OXYCODONE-ACETAMINOPHEN 10-325 MG PO TABS: 1.0000 | ORAL_TABLET | Freq: Four times a day (QID) | ORAL | Status: DC | PRN

## 2011-12-20 ENCOUNTER — Other Ambulatory Visit: Payer: Self-pay | Admitting: Family Medicine

## 2011-12-25 ENCOUNTER — Ambulatory Visit (INDEPENDENT_AMBULATORY_CARE_PROVIDER_SITE_OTHER): Payer: Medicare Other | Admitting: Family Medicine

## 2011-12-25 ENCOUNTER — Encounter: Payer: Self-pay | Admitting: Family Medicine

## 2011-12-25 VITALS — BP 128/76 | HR 100 | Resp 18 | Ht 73.0 in | Wt 355.1 lb

## 2011-12-25 DIAGNOSIS — R7309 Other abnormal glucose: Secondary | ICD-10-CM

## 2011-12-25 DIAGNOSIS — E669 Obesity, unspecified: Secondary | ICD-10-CM

## 2011-12-25 DIAGNOSIS — Z125 Encounter for screening for malignant neoplasm of prostate: Secondary | ICD-10-CM

## 2011-12-25 DIAGNOSIS — E785 Hyperlipidemia, unspecified: Secondary | ICD-10-CM

## 2011-12-25 DIAGNOSIS — R7303 Prediabetes: Secondary | ICD-10-CM

## 2011-12-25 DIAGNOSIS — Z0000 Encounter for general adult medical examination without abnormal findings: Secondary | ICD-10-CM

## 2011-12-25 MED ORDER — IPRATROPIUM-ALBUTEROL 0.5-2.5 (3) MG/3ML IN SOLN: 3.0000 mL | Freq: Three times a day (TID) | RESPIRATORY_TRACT | Status: DC

## 2011-12-25 NOTE — Progress Notes (Signed)
Subjective:    Patient ID: Gavin Gray, male    DOB: 06-Feb-1966, 46 y.o.   MRN: [id number]  HPI Preventive Screening-Counseling & Management   Patient present here today for a Medicare annual wellness visit.   Current Problems (verified)   Medications Prior to Visit Allergies (verified)   PAST HISTORY  Family History CAD, mental illness  Social History Divorced, lives with ex wife father of 2 adult kids, daughter has IDDM. Disabled in 2005,was in  in air conditioning, mental health issues   Risk Factors  Current exercise habits: walks inconsistently  Dietary issues discussed:low carb and low fat diet  And only needs to drink water   Cardiac risk factors: Grandfather and mom had CAD at age s 88 and 77, uncle at age 9  Depression Screen  (Note: if answer to either of the following is "Yes", a more complete depression screening is indicated)   Over the past two weeks, have you felt down, depressed or hopeless? No  Over the past two weeks, have you felt little interest or pleasure in doing things? No  Have you lost interest or pleasure in daily life? No  Do you often feel hopeless? No  Do you cry easily over simple problems? No   Activities of Daily Living  In your present state of health, do you have any difficulty performing the following activities?  Driving?: No Managing money?: gets help from father Feeding yourself?:No Getting from bed to chair?:No Climbing a flight of stairs?:yes Preparing food and eating?:yes will burn left hand no sensation Bathing or showering?:yes Getting dressed?:yes, left hand is barely functional, gets help from ex wife with ADL Getting to the toilet?:No Using the toilet?:No Moving around from place to place?: limited due to back and lower extremity pain, also swelling and pain in knees and ankles  Fall Risk Assessment In the past year have you fallen or had a near fall?:yes slipped in shower, tripped on shoe lace Are you  currently taking any medications that make you dizziness?:No   Hearing Difficulties: No Do you often ask people to speak up or repeat themselves?:No, reports 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 Yes Normal Appearance?Yes  Oriented to person? Yes Place? Yes  Time? Yes  Displays appropriate judgment?Yes  Can read the correct time from a watch face? yes Are you having problems remembering things?yes with  Short term memory  Advanced Directives have been discussed with the patient?Yes    List the Names of Other Physician/Practitioners you currently use: Dr. Lolly Mustache, Dr. Jena Gauss, Dr Hilda Lias     Assessment:    Annual Wellness Exam   Plan:    During the course of the visit the patient was educated and counseled about appropriate screening and preventive services including:  A healthy diet is rich in fruit, vegetables and whole grains. Poultry fish, nuts and beans are a healthy choice for protein rather then red meat. A low sodium diet and drinking 64 ounces of water daily is generally recommended. Oils and sweet should be limited. Carbohydrates especially for those who are diabetic or overweight, should be limited to 30-45 gram per meal. It is important to eat on a regular schedule, at least 3 times daily. Snacks should be primarily fruits, vegetables or nuts. It is important that you exercise regularly at  least 30 minutes 5 times a week. If you develop chest pain, have severe difficulty breathing, or feel very tired, stop exercising immediately and seek medical attention  Immunization reviewed and updated. Cancer screening reviewed and updated    Patient Instructions (the written plan) was given to the patient.  Medicare Attestation  I have personally reviewed:  The patient's medical  and social history  Their use of alcohol, tobacco or illicit drugs  Their current medications and supplements  The patient's functional ability including ADLs,fall risks, home safety risks, cognitive, and hearing and visual impairment  Diet and physical activities  Evidence for depression or mood disorders  The patient's weight, height, BMI, and visual acuity have been recorded in the chart. I have made referrals, counseling, and provided education to the patient based on review of the above and I have provided the patient with a written personalized care plan for preventive services.     Review of Systems     Objective:   Physical Exam        Assessment & Plan:

## 2011-12-25 NOTE — Patient Instructions (Addendum)
F/U in 4 month  Fasting labs  this week past due   You need to stop smoking

## 2012-01-04 ENCOUNTER — Other Ambulatory Visit: Payer: Self-pay

## 2012-01-04 MED ORDER — OXYCODONE-ACETAMINOPHEN 10-325 MG PO TABS: 1.0000 | ORAL_TABLET | Freq: Four times a day (QID) | ORAL | Status: DC | PRN

## 2012-01-06 DIAGNOSIS — Z0000 Encounter for general adult medical examination without abnormal findings: Secondary | ICD-10-CM | POA: Insufficient documentation

## 2012-01-06 NOTE — Assessment & Plan Note (Addendum)
Annual wellness exam performed and documented. Pt suffers from severe depression and costantly hallucinates. He states he wants to be a DNR, states has been through resuccitation in the past and does not wan a repeat. Not actively suicidal or homicidal currently. Needs to have=daughter or spouse return to next visit to establish end of life concerns Counseled re need to quit smoking as well as improved lifestyle

## 2012-01-08 ENCOUNTER — Other Ambulatory Visit: Payer: Self-pay | Admitting: Family Medicine

## 2012-01-08 LAB — LIPID PANEL
Cholesterol: 291 mg/dL — ABNORMAL HIGH (ref 0–200)
HDL: 29 mg/dL — ABNORMAL LOW
Total CHOL/HDL Ratio: 10 ratio
Triglycerides: 696 mg/dL — ABNORMAL HIGH

## 2012-01-08 LAB — COMPREHENSIVE METABOLIC PANEL
ALT: 26 U/L (ref 0–53)
AST: 8 U/L (ref 0–37)
Alkaline Phosphatase: 79 U/L (ref 39–117)
Calcium: 9.4 mg/dL (ref 8.4–10.5)
Chloride: 99 mEq/L (ref 96–112)
Creat: 1.29 mg/dL (ref 0.50–1.35)
Potassium: 4.7 mEq/L (ref 3.5–5.3)

## 2012-01-08 LAB — TSH: TSH: 5.151 u[IU]/mL — ABNORMAL HIGH (ref 0.350–4.500)

## 2012-01-08 LAB — HEMOGLOBIN A1C
Hgb A1c MFr Bld: 6 % — ABNORMAL HIGH (ref <5.7)
Mean Plasma Glucose: 126 mg/dL — ABNORMAL HIGH (ref <117)

## 2012-01-09 LAB — T4, FREE: Free T4: 1.19 ng/dL (ref 0.80–1.80)

## 2012-01-15 ENCOUNTER — Other Ambulatory Visit: Payer: Self-pay | Admitting: Family Medicine

## 2012-01-15 ENCOUNTER — Other Ambulatory Visit (HOSPITAL_COMMUNITY): Payer: Self-pay | Admitting: Psychiatry

## 2012-01-15 DIAGNOSIS — F319 Bipolar disorder, unspecified: Secondary | ICD-10-CM

## 2012-01-16 ENCOUNTER — Telehealth (HOSPITAL_COMMUNITY): Payer: Self-pay | Admitting: Psychiatry

## 2012-01-16 MED ORDER — HALOPERIDOL 20 MG PO TABS: 20.0000 mg | ORAL_TABLET | Freq: Every day | ORAL | Status: DC

## 2012-01-16 MED ORDER — BENZTROPINE MESYLATE 2 MG PO TABS: 2.0000 mg | ORAL_TABLET | Freq: Every day | ORAL | Status: DC

## 2012-01-17 ENCOUNTER — Other Ambulatory Visit: Payer: Self-pay | Admitting: Family Medicine

## 2012-01-17 ENCOUNTER — Other Ambulatory Visit (HOSPITAL_COMMUNITY): Payer: Self-pay | Admitting: Psychiatry

## 2012-01-18 ENCOUNTER — Other Ambulatory Visit: Payer: Self-pay

## 2012-01-18 MED ORDER — PRAVASTATIN SODIUM 40 MG PO TABS: 40.0000 mg | ORAL_TABLET | Freq: Every evening | ORAL | Status: DC

## 2012-01-22 ENCOUNTER — Encounter (HOSPITAL_COMMUNITY): Payer: Self-pay | Admitting: Psychiatry

## 2012-01-22 ENCOUNTER — Telehealth (HOSPITAL_COMMUNITY): Payer: Self-pay | Admitting: *Deleted

## 2012-01-22 ENCOUNTER — Ambulatory Visit (INDEPENDENT_AMBULATORY_CARE_PROVIDER_SITE_OTHER): Payer: Medicare Other | Admitting: Psychiatry

## 2012-01-22 VITALS — Wt 355.0 lb

## 2012-01-22 DIAGNOSIS — F323 Major depressive disorder, single episode, severe with psychotic features: Secondary | ICD-10-CM

## 2012-01-22 DIAGNOSIS — F319 Bipolar disorder, unspecified: Secondary | ICD-10-CM

## 2012-01-22 DIAGNOSIS — F1010 Alcohol abuse, uncomplicated: Secondary | ICD-10-CM

## 2012-01-22 DIAGNOSIS — F17200 Nicotine dependence, unspecified, uncomplicated: Secondary | ICD-10-CM

## 2012-01-22 DIAGNOSIS — F1021 Alcohol dependence, in remission: Secondary | ICD-10-CM
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
## 2012-01-22 NOTE — Patient Instructions (Signed)
Strongly consider attending at least 6 [facility] Meetings to help you learn about how your helping others to the exclusion of helping yourself is actually hurting yourself and is actually an addiction to fixing others and that you need to work the 12 Step to Happiness through the [employer]. [facility] Family Groups could be helpful with how to deal with substance abusing family and friends. Or your own issues of being in victim role.  There are only 40 [facility] Family Group meetings a week here in [place].  Online are current listing of those meetings @ [email or web address]  There are [employer].  Search on line and there you can learn the format and can access the schedule for yourself.  Their number is [phone]  Pt given address of mtg in [place] tonight.

## 2012-02-01 ENCOUNTER — Other Ambulatory Visit: Payer: Self-pay

## 2012-02-01 MED ORDER — OXYCODONE-ACETAMINOPHEN 10-325 MG PO TABS: 1.0000 | ORAL_TABLET | Freq: Four times a day (QID) | ORAL | Status: DC | PRN

## 2012-02-05 ENCOUNTER — Ambulatory Visit (INDEPENDENT_AMBULATORY_CARE_PROVIDER_SITE_OTHER): Payer: Medicare Other | Admitting: Psychiatry

## 2012-02-05 DIAGNOSIS — F333 Major depressive disorder, recurrent, severe with psychotic symptoms: Secondary | ICD-10-CM

## 2012-02-05 NOTE — Patient Instructions (Signed)
Discussed orally 
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
## 2012-02-06 ENCOUNTER — Ambulatory Visit (INDEPENDENT_AMBULATORY_CARE_PROVIDER_SITE_OTHER): Payer: Medicare Other | Admitting: Family Medicine

## 2012-02-06 ENCOUNTER — Encounter: Payer: Self-pay | Admitting: Family Medicine

## 2012-02-06 VITALS — BP 120/80 | HR 94 | Resp 18 | Ht 73.0 in | Wt 358.1 lb

## 2012-02-06 DIAGNOSIS — Z024 Encounter for examination for driving license: Secondary | ICD-10-CM

## 2012-02-06 DIAGNOSIS — Z0289 Encounter for other administrative examinations: Secondary | ICD-10-CM
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
## 2012-02-06 NOTE — Patient Instructions (Addendum)
F/u as before 

## 2012-02-10 DIAGNOSIS — Z024 Encounter for examination for driving license: Secondary | ICD-10-CM | POA: Insufficient documentation

## 2012-02-12 ENCOUNTER — Ambulatory Visit (HOSPITAL_COMMUNITY): Payer: Self-pay | Admitting: Psychiatry

## 2012-02-13 ENCOUNTER — Other Ambulatory Visit: Payer: Self-pay | Admitting: Gastroenterology

## 2012-02-13 ENCOUNTER — Other Ambulatory Visit: Payer: Self-pay | Admitting: Family Medicine

## 2012-02-15 ENCOUNTER — Other Ambulatory Visit: Payer: Self-pay | Admitting: Family Medicine

## 2012-02-21 ENCOUNTER — Ambulatory Visit (HOSPITAL_COMMUNITY): Payer: Self-pay | Admitting: Psychiatry

## 2012-02-21 ENCOUNTER — Other Ambulatory Visit: Payer: Self-pay | Admitting: Family Medicine

## 2012-02-22 ENCOUNTER — Other Ambulatory Visit: Payer: Self-pay | Admitting: Family Medicine

## 2012-02-22 ENCOUNTER — Other Ambulatory Visit: Payer: Self-pay

## 2012-02-22 MED ORDER — LIDOCAINE 5 % EX PTCH: 1.0000 | MEDICATED_PATCH | CUTANEOUS | Status: DC

## 2012-02-28 ENCOUNTER — Encounter (HOSPITAL_COMMUNITY): Payer: Self-pay | Admitting: Psychiatry

## 2012-02-28 ENCOUNTER — Ambulatory Visit: Payer: Medicare Other | Admitting: Family Medicine

## 2012-02-28 ENCOUNTER — Ambulatory Visit (INDEPENDENT_AMBULATORY_CARE_PROVIDER_SITE_OTHER): Payer: Medicare Other | Admitting: Psychiatry

## 2012-02-28 ENCOUNTER — Other Ambulatory Visit: Payer: Self-pay

## 2012-02-28 VITALS — Wt 355.8 lb

## 2012-02-28 DIAGNOSIS — F319 Bipolar disorder, unspecified: Secondary | ICD-10-CM

## 2012-02-28 DIAGNOSIS — F17200 Nicotine dependence, unspecified, uncomplicated: Secondary | ICD-10-CM

## 2012-02-28 DIAGNOSIS — F329 Major depressive disorder, single episode, unspecified: Secondary | ICD-10-CM

## 2012-02-28 DIAGNOSIS — F1021 Alcohol dependence, in remission: Secondary | ICD-10-CM

## 2012-02-28 DIAGNOSIS — E669 Obesity, unspecified: Secondary | ICD-10-CM

## 2012-02-28 DIAGNOSIS — F323 Major depressive disorder, single episode, severe with psychotic features: Secondary | ICD-10-CM

## 2012-02-28 DIAGNOSIS — F411 Generalized anxiety disorder: Secondary | ICD-10-CM

## 2012-02-28 DIAGNOSIS — F1010 Alcohol abuse, uncomplicated: Secondary | ICD-10-CM

## 2012-02-28 MED ORDER — ALPRAZOLAM 1 MG PO TABS: 1.0000 mg | ORAL_TABLET | Freq: Three times a day (TID) | ORAL | Status: DC | PRN

## 2012-02-28 MED ORDER — OXYCODONE-ACETAMINOPHEN 10-325 MG PO TABS: 1.0000 | ORAL_TABLET | Freq: Four times a day (QID) | ORAL | Status: DC | PRN

## 2012-02-28 MED ORDER — HALOPERIDOL 20 MG PO TABS: 20.0000 mg | ORAL_TABLET | Freq: Every day | ORAL | Status: DC

## 2012-02-28 MED ORDER — BENZTROPINE MESYLATE 2 MG PO TABS: 2.0000 mg | ORAL_TABLET | Freq: Every day | ORAL | Status: DC
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
## 2012-02-28 NOTE — Patient Instructions (Signed)
Strongly consider making another appointment with [name]  Get back into the 12 Step meetings.

## 2012-02-29 ENCOUNTER — Telehealth: Payer: Self-pay | Admitting: Family Medicine

## 2012-02-29 NOTE — Telephone Encounter (Signed)
Pt wants [place] to mail form once completed

## 2012-03-03 ENCOUNTER — Telehealth: Payer: Self-pay | Admitting: Family Medicine

## 2012-03-03 NOTE — Telephone Encounter (Signed)
When the form gets done pt wants it mailed. Dr Lodema Hong will bring it up front

## 2012-03-28 ENCOUNTER — Other Ambulatory Visit: Payer: Self-pay

## 2012-03-28 MED ORDER — OXYCODONE-ACETAMINOPHEN 10-325 MG PO TABS: 1.0000 | ORAL_TABLET | Freq: Four times a day (QID) | ORAL | Status: DC | PRN

## 2012-04-15 ENCOUNTER — Telehealth (HOSPITAL_COMMUNITY): Payer: Self-pay | Admitting: Psychiatry

## 2012-04-15 ENCOUNTER — Other Ambulatory Visit: Payer: Self-pay | Admitting: Family Medicine

## 2012-04-15 DIAGNOSIS — F319 Bipolar disorder, unspecified: Secondary | ICD-10-CM

## 2012-04-15 MED ORDER — ALPRAZOLAM 1 MG PO TABS: 1.0000 mg | ORAL_TABLET | Freq: Three times a day (TID) | ORAL | Status: DC | PRN

## 2012-04-15 NOTE — Telephone Encounter (Signed)
Refill request approved via eScripts.  

## 2012-04-16 ENCOUNTER — Encounter (HOSPITAL_COMMUNITY): Payer: Self-pay | Admitting: Psychiatry

## 2012-04-16 ENCOUNTER — Telehealth (HOSPITAL_COMMUNITY): Payer: Self-pay

## 2012-04-16 ENCOUNTER — Other Ambulatory Visit (HOSPITAL_COMMUNITY): Payer: Self-pay | Admitting: Psychiatry

## 2012-04-16 MED ORDER — ALPRAZOLAM 1 MG PO TABS: 1.0000 mg | ORAL_TABLET | Freq: Three times a day (TID) | ORAL | Status: DC | PRN

## 2012-04-16 MED ORDER — HALOPERIDOL 20 MG PO TABS: 20.0000 mg | ORAL_TABLET | Freq: Every day | ORAL | Status: DC

## 2012-04-16 MED ORDER — BENZTROPINE MESYLATE 2 MG PO TABS: 2.0000 mg | ORAL_TABLET | Freq: Every day | ORAL | Status: DC

## 2012-04-16 NOTE — Patient Instructions (Signed)
"  Native Wisdom for White Minds" by Anne Wilson Schaef could be very helpful.  Call if problems or concerns.  
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
## 2012-04-22 ENCOUNTER — Ambulatory Visit (INDEPENDENT_AMBULATORY_CARE_PROVIDER_SITE_OTHER): Payer: Medicare Other | Admitting: Psychiatry

## 2012-04-22 VITALS — Wt 342.8 lb

## 2012-04-22 DIAGNOSIS — E669 Obesity, unspecified: Secondary | ICD-10-CM

## 2012-04-22 DIAGNOSIS — F411 Generalized anxiety disorder: Secondary | ICD-10-CM

## 2012-04-22 DIAGNOSIS — F1021 Alcohol dependence, in remission: Secondary | ICD-10-CM

## 2012-04-22 DIAGNOSIS — F17200 Nicotine dependence, unspecified, uncomplicated: Secondary | ICD-10-CM

## 2012-04-22 DIAGNOSIS — F319 Bipolar disorder, unspecified: Secondary | ICD-10-CM

## 2012-04-24 ENCOUNTER — Encounter: Payer: Self-pay | Admitting: Family Medicine

## 2012-04-24 ENCOUNTER — Ambulatory Visit (INDEPENDENT_AMBULATORY_CARE_PROVIDER_SITE_OTHER): Payer: Medicare Other | Admitting: Family Medicine

## 2012-04-24 VITALS — BP 120/80 | HR 84 | Resp 16 | Ht 73.0 in | Wt 339.4 lb

## 2012-04-24 DIAGNOSIS — R7303 Prediabetes: Secondary | ICD-10-CM

## 2012-04-24 DIAGNOSIS — R7301 Impaired fasting glucose: Secondary | ICD-10-CM

## 2012-04-24 DIAGNOSIS — E785 Hyperlipidemia, unspecified: Secondary | ICD-10-CM

## 2012-04-24 DIAGNOSIS — F17200 Nicotine dependence, unspecified, uncomplicated: Secondary | ICD-10-CM

## 2012-04-24 DIAGNOSIS — J449 Chronic obstructive pulmonary disease, unspecified: Secondary | ICD-10-CM

## 2012-04-24 DIAGNOSIS — R1013 Epigastric pain: Secondary | ICD-10-CM

## 2012-04-24 DIAGNOSIS — M79609 Pain in unspecified limb: Secondary | ICD-10-CM

## 2012-04-24 DIAGNOSIS — I10 Essential (primary) hypertension: Secondary | ICD-10-CM

## 2012-04-24 DIAGNOSIS — J4489 Other specified chronic obstructive pulmonary disease: Secondary | ICD-10-CM

## 2012-04-24 DIAGNOSIS — R7309 Other abnormal glucose: Secondary | ICD-10-CM

## 2012-04-24 DIAGNOSIS — R5381 Other malaise: Secondary | ICD-10-CM

## 2012-04-24 DIAGNOSIS — K219 Gastro-esophageal reflux disease without esophagitis: Secondary | ICD-10-CM

## 2012-04-24 DIAGNOSIS — E669 Obesity, unspecified: Secondary | ICD-10-CM

## 2012-04-24 MED ORDER — OXYCODONE-ACETAMINOPHEN 10-325 MG PO TABS: 1.0000 | ORAL_TABLET | Freq: Four times a day (QID) | ORAL | Status: DC | PRN

## 2012-04-24 MED ORDER — ESOMEPRAZOLE MAGNESIUM 40 MG PO CPDR: 40.0000 mg | DELAYED_RELEASE_CAPSULE | Freq: Every day | ORAL | Status: DC

## 2012-04-24 NOTE — Assessment & Plan Note (Signed)
Reports GERD not controlled, by dexilant , states foods is choking him, coming back up, no dysphagia at thsi time wants nexium back

## 2012-04-24 NOTE — Patient Instructions (Addendum)
F/u in 13 weeeks.  Labs today lipid, cmp, hBA1C, cbc and TSH, h pylori  We will try to prior authorize your nexium since dexilant is not working for you  Congrats on 20 pound weight loss, keep it up  No cheese!!  We will call with the cholesterol med you need .  You will be referred for gall bladder ultrasound and also for a chest CT scan, you need to stop smoking so your breathing improves, and you reduce your risk of cancer, heart disease and stroke  Pain med script will be given to you today

## 2012-04-24 NOTE — Progress Notes (Signed)
  Subjective:    Patient ID: Gavin Gray, male    DOB: [birth year], 47 y.o.   MRN: [id number]  HPI The PT is here for follow up and re-evaluation of chronic medical conditions, medication management and review of any available recent lab and radiology data.  Preventive health is updated, specifically  Cancer screening and Immunization.   States he is returning to previous psychiatrist who is now in [place] C/o ineffectiveness of dexilant as far as reflux symptoms are concerned, feels food regurgitating and requests nexium back. C/o bloating, belching and RUQ pain Counseled re nicotine cessation, unwilling to set a quit date at this time, but he is working on reducing usage  He has worked very Social worker on weight loss with change in eating habits since his last visit, which is excellent      Review of Systems See HPI Denies recent fever or chills. Denies sinus pressure, nasal congestion, ear pain or sore throat. Denies chest congestion, productive cough or wheezing.chronic dyspnea Denies chest pains, palpitations and leg swelling .   Denies dysuria, frequency, hesitancy or incontinence. Chronic back and left upper extremity pain Denies headaches, seizures, numbness, or tingling. Denies uncontrolled  depression, anxiety or insomnia. Denies skin break down or rash.        Objective:   Physical Exam  Patient alert and oriented and in no cardiopulmonary distress.  HEENT: No facial asymmetry, EOMI, no sinus tenderness,  oropharynx pink and moist.  Neck adequate ROM no adenopathy.  Chest: Clear to auscultation bilaterally.decreased air entry throughout  CVS: S1, S2 no murmurs, no S3.  ABD: Soft non tender. Bowel sounds normal.  Ext: No edema  [id number] though adequate   ROM spine, and left upper extremity  Skin: Intact, no ulcerations or rash noted.  Psych: Good eye contact, normal affect. Memory intact not anxious or depressed appearing.  CNS: CN 2-12 intact,  power, normal throughout.       Assessment & Plan:

## 2012-04-25 ENCOUNTER — Other Ambulatory Visit: Payer: Self-pay

## 2012-04-25 ENCOUNTER — Ambulatory Visit (HOSPITAL_COMMUNITY): Payer: Self-pay | Admitting: Psychiatry

## 2012-04-25 MED ORDER — DICLOFENAC SODIUM 75 MG PO TBEC: DELAYED_RELEASE_TABLET | ORAL | Status: DC

## 2012-04-25 MED ORDER — OXYCODONE-ACETAMINOPHEN 10-325 MG PO TABS: 1.0000 | ORAL_TABLET | Freq: Four times a day (QID) | ORAL | Status: DC | PRN

## 2012-04-28 NOTE — Assessment & Plan Note (Signed)
Controlled, no change in medication DASH diet and commitment to daily physical activity for a minimum of 30 minutes discussed and encouraged, as a part of hypertension management. The importance of attaining a healthy weight is also discussed.  

## 2012-04-28 NOTE — Assessment & Plan Note (Signed)
Improved. Pt applauded on succesful weight loss through lifestyle change, and encouraged to continue same. Weight loss goal set for the next several months.  

## 2012-04-28 NOTE — Assessment & Plan Note (Signed)
Updated lab needed, with recent weight loss I expect this to improve Patient educated about the importance of limiting  Carbohydrate intake , the need to commit to daily physical activity for a minimum of 30 minutes , and to commit weight loss. The fact that changes in all these areas will reduce or eliminate all together the development of diabetes is stressed.

## 2012-04-28 NOTE — Assessment & Plan Note (Signed)
Unchanged, chronic pain meds as before 

## 2012-04-28 NOTE — Assessment & Plan Note (Signed)
Pt has an over 30 pack year h/o nicotine use, chest CT scan ordered as a lung cancer screen

## 2012-04-28 NOTE — Assessment & Plan Note (Signed)
Hyperlipidemia:Low fat diet discussed and encouraged.  Updated lab needed 

## 2012-04-28 NOTE — Assessment & Plan Note (Signed)
Uncontrolled symptoms of bloating , belching and RUQ pain , gall bladder US ordered

## 2012-04-28 NOTE — Assessment & Plan Note (Signed)
Reducing usage, however unwilling to set a quit date  Patient counseled for approximately 5 minutes regarding the health risks of ongoing nicotine use, specifically all types of cancer, heart disease, stroke and respiratory failure. The options available for help with cessation ,the behavioral changes to assist the process, and the option to either gradully reduce usage  Or abruptly stop.is also discussed. Pt is also encouraged to set specific goals in number of cigarettes used daily, as well as to set a quit date.

## 2012-05-01 ENCOUNTER — Telehealth: Payer: Self-pay | Admitting: Family Medicine

## 2012-05-05 NOTE — Telephone Encounter (Signed)
Dr. Simpson is aware °

## 2012-05-11 ENCOUNTER — Telehealth: Payer: Self-pay | Admitting: Family Medicine

## 2012-05-11 NOTE — Telephone Encounter (Signed)
I spoke directly with MD for insurance company re screening chest CT scan due to long h/o nicotine, this is denied based on parameters used. Test will therefore not be ordered unless pt willing/able to pay for this. He is currently working on healthy lifesttle changes including smoking cesation

## 2012-05-13 ENCOUNTER — Ambulatory Visit (HOSPITAL_COMMUNITY): Payer: Medicare Other

## 2012-05-14 ENCOUNTER — Encounter (HOSPITAL_COMMUNITY): Payer: Self-pay | Admitting: Psychiatry

## 2012-05-14 ENCOUNTER — Ambulatory Visit (HOSPITAL_COMMUNITY): Payer: Self-pay | Admitting: Psychiatry

## 2012-05-14 ENCOUNTER — Ambulatory Visit (INDEPENDENT_AMBULATORY_CARE_PROVIDER_SITE_OTHER): Payer: Medicare Other | Admitting: Psychiatry

## 2012-05-14 VITALS — BP 119/72 | HR 83 | Wt 332.8 lb

## 2012-05-14 DIAGNOSIS — F323 Major depressive disorder, single episode, severe with psychotic features: Secondary | ICD-10-CM

## 2012-05-14 DIAGNOSIS — F319 Bipolar disorder, unspecified: Secondary | ICD-10-CM

## 2012-05-14 MED ORDER — BENZTROPINE MESYLATE 2 MG PO TABS: 2.0000 mg | ORAL_TABLET | Freq: Every day | ORAL | Status: DC

## 2012-05-14 MED ORDER — ALPRAZOLAM 1 MG PO TABS: 1.0000 mg | ORAL_TABLET | Freq: Three times a day (TID) | ORAL | Status: DC | PRN

## 2012-05-14 MED ORDER — HALOPERIDOL 20 MG PO TABS: 20.0000 mg | ORAL_TABLET | Freq: Every day | ORAL | Status: DC
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
## 2012-05-16 ENCOUNTER — Ambulatory Visit (HOSPITAL_COMMUNITY): Payer: Medicare Other | Attending: Family Medicine

## 2012-05-23 ENCOUNTER — Ambulatory Visit (INDEPENDENT_AMBULATORY_CARE_PROVIDER_SITE_OTHER): Payer: Medicare Other | Admitting: Psychiatry

## 2012-05-23 DIAGNOSIS — F333 Major depressive disorder, recurrent, severe with psychotic symptoms: Secondary | ICD-10-CM

## 2012-05-23 NOTE — Patient Instructions (Signed)
Suicidal Feelings, How to Help Yourself Everyone feels sad or unhappy at times, but depressing thoughts and feelings of hopelessness can lead to thoughts of suicide. It can seem as if life is too tough to handle. If you feel as though you have reached the point where suicide is the only answer, it is time to let someone know immediately.  HOW TO COPE AND PREVENT SUICIDE  Let family, friends, teachers, or counselors know. Get help. Try not to isolate yourself from those who care about you. Even though you may not feel sociable, talk with someone every day. It is best if it is face-to-face. Remember, they will want to help you.  Eat a regularly spaced and well-balanced diet.  Get plenty of rest.  Avoid alcohol and drugs because they will only make you feel worse and may also lower your inhibitions. Remove them from the home. If you are thinking of taking an overdose of your prescribed medicines, give your medicines to someone who can give them to you one day at a time. If you are on antidepressants, let your caregiver know of your feelings so he or she can provide a safer medicine, if that is a concern.  Remove weapons or poisons from your home.  Try to stick to routines. Follow a schedule and remind yourself that you have to keep that schedule every day.  Set some realistic goals and achieve them. Make a list and cross things off as you go. Accomplishments give a sense of worth. Wait until you are feeling better before doing things you find difficult or unpleasant to do.  If you are able, try to start exercising. Even half-hour periods of exercise each day will make you feel better. Getting out in the sun or into nature helps you recover from depression faster. If you have a favorite place to walk, take advantage of that.  Increase safe activities that have always given you pleasure. This may include playing your favorite music, reading a good book, painting a picture, or playing your favorite  instrument. Do whatever takes your mind off your depression.  Keep your living space well-lighted. GET HELP Contact a suicide hotline, crisis center, or local suicide prevention center for help right away. Local centers may include a hospital, clinic, community service organization, social service provider, or health department.  Call your local emergency services (911 in the [place]).  Call a suicide hotline:  1-800-273-TALK (787-687-7888) in the [place].  1-800-SUICIDE 9514657349) in the [place].  714-718-4044 in the [place] for Spanish-speaking counselors.  4-696-295-2WUX 9715865153) in the [place] for TTY users.  Visit the following websites for information and help:  National Suicide Prevention Lifeline: www.suicidepreventionlifeline.org  Hopeline: www.hopeline.com  [employer] for Suicide Prevention: https://www.ayers.com/  For lesbian, gay, bisexual, transgender, or questioning youth, contact [employer]:  3-664-4-I-HKVQQV (651)558-6436) in the [place].  www.thetrevorproject.org  In [place], treatment resources are listed in each province with listings available under [employer] for [employer] or similar titles. Another source for Crisis Centres by [place] is located at http://www.suicideprevention.ca/in-crisis-now/find-a-crisis-centre-now/crisis-centres Document Released: 08/19/2002 Document Revised: 05/07/2011 Document Reviewed: 01/07/2007 [facility] Patient Information 2013 [place], [place]. Helping Someone Who Is Suicidal Take threats of suicide seriously. Listen to a suicidal person's thoughts and concerns with compassion. The fact that the person is talking to you is an important sign that he or she trusts you. Reasons for suicide can depend on where we are in life.   The younger person is often  depressed over lost love.  The middle-aged person is often depressed over financial problems.  The elderly  person is often depressed over health problems. SIGNS IN FAMILY OR FRIENDS WHO ARE SUICIDAL INCLUDE:  Depression which suddenly gets better. Getting over depression is usually a gradual process. A sudden change may mean the person has suddenly thought of suicide as a "solution."  A sudden loss of interest in family and friends and social withdrawal.  Loss of personal hygiene habits and not caring for himself or herself.  Decline in handling of school, work, or other activities.  Injuries which are self-inflicted, such as burning or cutting.  Expressions of helplessness, hopelessness, and a sense of the loss of ability to handle life.  Risk-taking behavior, such as casual sex and drug use. COMMON SUICIDE RISKS INCLUDE:  Death or terminal illness of a relative or friend.  Broken relationships.  Loss of health.  Financial losses.  Chemical abuse (drugs and alcohol).  Previous suicide attempts. If you do not feel adequate to listen or help, ask the person if you can help him or her get help. Ask if you can share the person's concerns with someone else such as a Pharmacist, hospital. Just talking with someone else is helpful and you can be that help by listening. Some helpful tips are:  Listen to the person's thoughts and concerns. Let the person unburden his or her troubles on you.  Let the person know you will not let him or her be alone with the pain.  Ask the person if he or she is having thoughts of hurting himself or herself.  Ask what you can do to help lessen the pain.  Suggest that the person seek professional help and that you will assist him or her in finding help. Let the person know you will continue to be available to help. GET HELP  Contact a suicide hotline, crisis center, or local suicide prevention center for help right away. Local centers may include a hospital, clinic, community service organization, social service provider, or health department.  Call your  local emergency services (911 in the [place]).  Call a suicide hotline:  1-800-273-TALK (450-533-9088) in the [place].  1-800-SUICIDE 234-591-3120) in the [place].  916 209 1928 in the [place] for Spanish-speaking counselors.  4-696-295-2WUX (386)763-8622) in the [place] for TTY users.  Visit the following websites for information and help:  National Suicide Prevention Lifeline: www.suicidepreventionlifeline.org  Hopeline: www.hopeline.com  [employer] for Suicide Prevention: https://www.ayers.com/  For lesbian, gay, bisexual, transgender, or questioning youth, contact [employer]:  3-664-4-I-HKVQQV 7090135500) in the [place].  www.thetrevorproject.org  In [place], treatment resources are listed in each province with listings available under [employer] for [employer] or similar titles. Another source for Crisis Centres by [place] is located at http://www.suicideprevention.ca/in-crisis-now/find-a-crisis-centre-now/crisis-centres Document Released: 08/19/2002 Document Revised: 05/07/2011 Document Reviewed: 10/21/2007 [facility] Patient Information 2013 [place], [place].
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
## 2012-05-26 ENCOUNTER — Telehealth: Payer: Self-pay | Admitting: Family Medicine

## 2012-05-26 ENCOUNTER — Other Ambulatory Visit: Payer: Self-pay

## 2012-05-26 LAB — HEMOGLOBIN A1C: Hgb A1c MFr Bld: 5.9 % — ABNORMAL HIGH (ref <5.7)

## 2012-05-26 LAB — LIPID PANEL
Cholesterol: 219 mg/dL — ABNORMAL HIGH (ref 0–200)
LDL Cholesterol: 118 mg/dL — ABNORMAL HIGH (ref 0–99)
Triglycerides: 396 mg/dL — ABNORMAL HIGH (ref <150)

## 2012-05-26 LAB — CBC WITH DIFFERENTIAL/PLATELET
Basophils Absolute: 0 10*3/uL (ref 0.0–0.1)
Basophils Relative: 0 % (ref 0–1)
Eosinophils Absolute: 0.6 10*3/uL (ref 0.0–0.7)
Lymphs Abs: 2.7 10*3/uL (ref 0.7–4.0)
MCH: 31.7 pg (ref 26.0–34.0)
MCHC: 34.9 g/dL (ref 30.0–36.0)
Neutrophils Relative %: 53 % (ref 43–77)
Platelets: 343 10*3/uL (ref 150–400)
RBC: 4.63 MIL/uL (ref 4.22–5.81)

## 2012-05-26 LAB — TSH: TSH: 2.951 u[IU]/mL (ref 0.350–4.500)

## 2012-05-26 LAB — COMPREHENSIVE METABOLIC PANEL
ALT: 20 U/L (ref 0–53)
BUN: 16 mg/dL (ref 6–23)
CO2: 25 mEq/L (ref 19–32)
Calcium: 9.1 mg/dL (ref 8.4–10.5)
Chloride: 101 mEq/L (ref 96–112)
Creat: 1.22 mg/dL (ref 0.50–1.35)

## 2012-05-26 MED ORDER — OXYCODONE-ACETAMINOPHEN 10-325 MG PO TABS: 1.0000 | ORAL_TABLET | Freq: Four times a day (QID) | ORAL | Status: DC | PRN

## 2012-05-26 NOTE — Telephone Encounter (Signed)
Patient is aware 

## 2012-05-26 NOTE — Telephone Encounter (Signed)
pls let pt know his insurance will not cover the chest CT scan, I spoke directly with the provider, they do not screening scans

## 2012-05-29 ENCOUNTER — Ambulatory Visit (HOSPITAL_COMMUNITY)
Admission: RE | Admit: 2012-05-29 | Discharge: 2012-05-29 | Disposition: A | Discharge status: Home / Self Care | Payer: Medicare Other | Source: Ambulatory Visit | Attending: Family Medicine | Admitting: Family Medicine

## 2012-05-29 DIAGNOSIS — I10 Essential (primary) hypertension: Secondary | ICD-10-CM | POA: Insufficient documentation

## 2012-05-29 DIAGNOSIS — R1013 Epigastric pain: Secondary | ICD-10-CM

## 2012-05-29 DIAGNOSIS — R1011 Right upper quadrant pain: Secondary | ICD-10-CM | POA: Insufficient documentation

## 2012-05-29 DIAGNOSIS — K7689 Other specified diseases of liver: Secondary | ICD-10-CM | POA: Insufficient documentation

## 2012-05-29 DIAGNOSIS — K3189 Other diseases of stomach and duodenum: Secondary | ICD-10-CM | POA: Insufficient documentation

## 2012-05-30 ENCOUNTER — Ambulatory Visit (INDEPENDENT_AMBULATORY_CARE_PROVIDER_SITE_OTHER): Payer: Medicare Other | Admitting: Psychiatry

## 2012-05-30 DIAGNOSIS — F333 Major depressive disorder, recurrent, severe with psychotic symptoms: Secondary | ICD-10-CM

## 2012-05-30 NOTE — Progress Notes (Signed)
Patient:  Gavin Gray   DOB: 20-Apr-1965  MR Number: [id number]  Location: [facility] Center:  [address] [place],  [place], [address]  Start:  Friday 05/30/2012 1:05 PM End:  Friday 05/30/2012 2:05 PM  Provider/Observer:     Florencia Reasons, MSW, LCSW   Chief Complaint:      Chief Complaint  Patient presents with  . Depression  . Anxiety    Reason For Service:     The patient initially was referred for services upon his discharge from the [facility] where he was treated for depression and suicidal ideations. Patient has a long-standing history of recurrent periods of depression and anxiety. He is resuming services today after a 3 month absence as patient is experiencing increased stress, depressed mood, and anxiety.  Patient is seen for a followup appointment today  Interventions Strategy:  Supportive therapy, cognitive behavioral therapy  Participation Level:   Active  Participation Quality:  Appropriate      Behavioral Observation:  Fairly Groomed, Alert, and anxious, depressed, tearful  Current Psychosocial Factors:  Patient reports stress regarding recent conflict with his daughter and ongoing conflict with his son. Patient also reports ongoing stress regarding the relationship with his wife and ongoing affects of being convicted as a sex offender.  Content of Session:   Reviewing symptoms, processing feelings, discussing boundary issues in the relationship with his children, identifying ways to take care of self, identifying coping and relaxation techniques  Current Status:   Patient reports depressed mood, mood swings, anger, irritability, excessive worrying, loss of appetiete and sleep difficulty. Patient also reports auditory and visual hallucinations and admits command hallucinations telling patient to harm self but being able to refrain. Patient also reports having fleeting suicidal ideations this past week but states he will not harm self. He denies  current suicidal ideation and homicidal ideations. [name] daughter maintains control of potential weapons in months per patient's report. Patient agrees to call this practice, call 911, or have someone take him to the emergency room should symptoms worsen. Patient agrees to see Dr. Lolly Mustache for an earlier appointment. He will see Dr. Lolly Mustache on 06/03/2012 at 3:45 PM  in [place]. Patient agrees to return to see this clinician the latter part of next week.   Patient Progress:   Poor.  Patient reports  increased stress due to learning yesterday that he has to continue to register as a sex offender according to the updated [place] sex offender law. Patient initially thought that next year would be the last year he would have to register. He also was informed by an attorney that his sex offender conviction could be overturned based on his review of patient's case and informed patient he was willing to take his case. Patient expresses sadness, anger, and disappointment that his daughter would not answer he when he asked if she would testify in a court case on patient's behalf regarding her original testimony. Patient reports feeling used by his daughter, his son, and his wife. He reports planning to move into his own place the beginning of May. Patient reports feelings of worthlessness and hopelessness. Therapist works with patient to process his feelings, to identify strengths in overcoming previous adversity, identify ways to take care of self, and identify coping and relaxation techniques.     Impression/Diagnosis:   The patient has a long-standing history of recurrent periods of depression along with anxiety and panic attacks. Diagnoses: Maj. depressive disorder, anxiety disorder NOS  Diagnosis:  Axis I:  Major depressive disorder, recurrent, severe with psychotic features          Axis II: Deferred

## 2012-05-30 NOTE — Patient Instructions (Signed)
Discussed orally 

## 2012-06-03 ENCOUNTER — Ambulatory Visit (HOSPITAL_COMMUNITY): Payer: Self-pay | Admitting: Psychiatry

## 2012-06-03 ENCOUNTER — Telehealth (HOSPITAL_COMMUNITY): Payer: Self-pay | Admitting: Psychiatry

## 2012-06-03 ENCOUNTER — Telehealth (HOSPITAL_COMMUNITY): Payer: Self-pay

## 2012-06-03 ENCOUNTER — Telehealth: Payer: Self-pay | Admitting: Family Medicine

## 2012-06-03 NOTE — Telephone Encounter (Signed)
Call return.  Patient was not at home.  He spoke to his wife who mentioned that patient went outside with his daughter.  Message left a call us back.  Patient had appointment with this writer at [phone] however patient canceled due to transportation issue.
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
## 2012-06-03 NOTE — Telephone Encounter (Signed)
Chart reviewed and spoke to the patient.  Patient requesting antidepressant.  He did not mention about recent stressors.  He keep insisting to start antidepressant.  As per chart patient has suicidal thinking and has plan to crash his car.  I explained that even if I start an antidepressant he would require observation.  Patient handed the telephone to his daughter.  I spoke to his daughter and explained that patient requires inpatient treatment.  Daughter agreed and she will promise to take him to the emergency room for evaluation.

## 2012-06-06 ENCOUNTER — Ambulatory Visit (HOSPITAL_COMMUNITY): Payer: Self-pay | Admitting: Psychiatry

## 2012-06-06 NOTE — Telephone Encounter (Signed)
Letter has been mailed out to patient with results

## 2012-06-11 ENCOUNTER — Other Ambulatory Visit: Payer: Self-pay

## 2012-06-11 ENCOUNTER — Other Ambulatory Visit (HOSPITAL_COMMUNITY): Payer: Self-pay | Admitting: *Deleted

## 2012-06-11 MED ORDER — PROPRANOLOL HCL 20 MG PO TABS: ORAL_TABLET | ORAL | Status: DC

## 2012-06-11 MED ORDER — GABAPENTIN 400 MG PO CAPS: ORAL_CAPSULE | ORAL | Status: DC

## 2012-06-11 MED ORDER — AMLODIPINE BESY-BENAZEPRIL HCL 10-40 MG PO CAPS: ORAL_CAPSULE | ORAL | Status: DC

## 2012-06-11 MED ORDER — RANITIDINE HCL 150 MG PO TABS: ORAL_TABLET | ORAL | Status: DC

## 2012-06-19 ENCOUNTER — Other Ambulatory Visit: Payer: Self-pay

## 2012-06-19 MED ORDER — OXYCODONE-ACETAMINOPHEN 10-325 MG PO TABS: 1.0000 | ORAL_TABLET | Freq: Four times a day (QID) | ORAL | Status: DC | PRN

## 2012-06-23 ENCOUNTER — Other Ambulatory Visit: Payer: Self-pay | Admitting: Family Medicine

## 2012-06-23 DIAGNOSIS — G8929 Other chronic pain: Secondary | ICD-10-CM

## 2012-06-24 ENCOUNTER — Other Ambulatory Visit (HOSPITAL_COMMUNITY): Payer: Self-pay | Admitting: Psychiatry

## 2012-06-24 ENCOUNTER — Other Ambulatory Visit (HOSPITAL_COMMUNITY): Payer: Self-pay | Admitting: *Deleted

## 2012-06-24 DIAGNOSIS — F319 Bipolar disorder, unspecified: Secondary | ICD-10-CM

## 2012-06-24 NOTE — Telephone Encounter (Signed)
Patient left [id number] 5/19.Plans to keep it. Xanax will run out 5/16.Would like prescription for it, wants to pick up written RX on 5/6 when he is in [place].Had a bad episode with some bad news and wants to apologize.

## 2012-06-24 NOTE — Telephone Encounter (Signed)
Given on 05/14/12 with one more refill.

## 2012-07-08 ENCOUNTER — Encounter (HOSPITAL_COMMUNITY): Payer: Self-pay | Admitting: Psychiatry

## 2012-07-08 ENCOUNTER — Ambulatory Visit (INDEPENDENT_AMBULATORY_CARE_PROVIDER_SITE_OTHER): Payer: Medicare Other | Admitting: Psychiatry

## 2012-07-08 VITALS — BP 125/87 | HR 76 | Ht 71.0 in | Wt 318.0 lb

## 2012-07-08 DIAGNOSIS — F323 Major depressive disorder, single episode, severe with psychotic features: Secondary | ICD-10-CM

## 2012-07-08 DIAGNOSIS — F1011 Alcohol abuse, in remission: Secondary | ICD-10-CM

## 2012-07-08 DIAGNOSIS — G8929 Other chronic pain: Secondary | ICD-10-CM

## 2012-07-08 DIAGNOSIS — F319 Bipolar disorder, unspecified: Secondary | ICD-10-CM

## 2012-07-08 MED ORDER — BENZTROPINE MESYLATE 2 MG PO TABS: 2.0000 mg | ORAL_TABLET | Freq: Every day | ORAL | Status: DC

## 2012-07-08 MED ORDER — ALPRAZOLAM 1 MG PO TABS: 1.0000 mg | ORAL_TABLET | Freq: Three times a day (TID) | ORAL | Status: DC | PRN

## 2012-07-08 MED ORDER — FLUOXETINE HCL 10 MG PO CAPS: 10.0000 mg | ORAL_CAPSULE | Freq: Every day | ORAL | Status: DC

## 2012-07-08 MED ORDER — HALOPERIDOL 10 MG PO TABS: ORAL_TABLET | ORAL | Status: DC
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
## 2012-07-09 ENCOUNTER — Ambulatory Visit (INDEPENDENT_AMBULATORY_CARE_PROVIDER_SITE_OTHER): Payer: Medicare Other | Admitting: Psychiatry

## 2012-07-09 DIAGNOSIS — F333 Major depressive disorder, recurrent, severe with psychotic symptoms: Secondary | ICD-10-CM

## 2012-07-10 NOTE — Progress Notes (Signed)
Patient:  Gavin Gray   DOB: 25-May-1965  MR Number: [id number]  Location: Behavioral Health Center:  [address] [place] [place],  [place], [address]  Start:  Wednesday 07/09/2012 10:00 PM End:  Wednesday 07/09/2012 10:55 PM  Provider/Observer:     Florencia Reasons, MSW, LCSW   Chief Complaint:      Chief Complaint  Patient presents with  . Depression  . Anxiety    Reason For Service:     The patient initially was referred for services upon his discharge from the [facility] where he was treated for depression and suicidal ideations. Patient has a long-standing history of recurrent periods of depression and anxiety. He is resuming services today after a 3 month absence as patient is experiencing increased stress, depressed mood, and anxiety.  Patient is seen for a followup appointment today  Interventions Strategy:  Supportive therapy, cognitive behavioral therapy  Participation Level:   Active  Participation Quality:  Appropriate      Behavioral Observation:  Fairly Groomed, Alert, and anxious, depressed, tearful at times  Current Psychosocial Factors:  Patient reports stress regarding ongoing conflict with his daughter and his son.    Content of Session:   Reviewing symptoms, processing feelings, identify ways to improve to set and maintain boundaries in the relationship with his children, identifying ways to improve self-care, reviewing coping and relaxation techniques  Current Status:   Patient reports less depressed mood, decreased mood swings, and decreased anger but continued anxiety,  excessive worrying, loss of appetiete and sleep difficulty. Patient also reports continued auditory hallucinations telling patient to harm self but being able to refrain. Patient denies having any suicidal ideations since phone call to therapist on 06/03/2012 and denies current suicidal ideation and homicidal ideations.  Patient agrees to call this practice, call 911, or have someone take  him to the emergency room should symptoms worsen.   Patient Progress:   Fair.  Patient reports  being actively suicidal during his phone call to therapist on 06/03/2012 but reports being able to manage without going to the hospital. Patient states thinking about his parents and deciding suicide wasn't the right thing to do. He expresses concern as both parents have declining health. He expresses disappointment that his daughter will not testify in a court case on patient's behalf regarding her original testimony due to to fear of perjury. He states deciding to just let the case go so he can go on with his life. He continues to express frustration regarding daughter and son as the day use him but admits he allows this to happen. Patient states doing what children want him to do as he thinks this is the only way they will love him and admits difficulty loving and accepting self. Therapist works with patient to identify incidents and  effects on self and the relationship with his children when he has set boundaries. Patient is pleased about recently seeing his newborn granddaughter and his grandson. Therapist works with patient to review relaxation and coping techniques   Impression/Diagnosis:   The patient has a long-standing history of recurrent periods of depression along with anxiety and panic attacks. Diagnoses: Maj. depressive disorder, anxiety disorder NOS  Diagnosis:  Axis I:  Major depressive disorder, recurrent, severe with psychotic features          Axis II: Deferred

## 2012-07-10 NOTE — Patient Instructions (Signed)
Discussed orally 

## 2012-07-14 ENCOUNTER — Other Ambulatory Visit: Payer: Self-pay | Admitting: Family Medicine

## 2012-07-14 ENCOUNTER — Ambulatory Visit (HOSPITAL_COMMUNITY): Payer: Medicare Other | Admitting: Psychiatry

## 2012-07-14 DIAGNOSIS — R1011 Right upper quadrant pain: Secondary | ICD-10-CM

## 2012-07-15 ENCOUNTER — Encounter (HOSPITAL_COMMUNITY): Payer: Medicare Other

## 2012-07-15 ENCOUNTER — Ambulatory Visit (INDEPENDENT_AMBULATORY_CARE_PROVIDER_SITE_OTHER): Payer: Medicare Other | Admitting: Psychiatry

## 2012-07-15 DIAGNOSIS — F333 Major depressive disorder, recurrent, severe with psychotic symptoms: Secondary | ICD-10-CM

## 2012-07-15 NOTE — Progress Notes (Signed)
Patient:  Gavin Gray   DOB: Mar 15, 1965  MR Number: [id number]  Location: Behavioral Health Center:  [address] [place],  [place], [address]  Start:  Tuesday 07/15/2012 4:05 PM End:  Tuesday 07/15/2012 4:55 PM  Provider/Observer:     Florencia Reasons, MSW, LCSW   Chief Complaint:      Chief Complaint  Patient presents with  . Depression  . Anxiety    Reason For Service:     The patient initially was referred for services upon his discharge from the [facility] where he was treated for depression and suicidal ideations. Patient has a long-standing history of recurrent periods of depression and anxiety. Patient is seen for a follow up appointment today.  Interventions Strategy:  Supportive therapy, cognitive behavioral therapy  Participation Level:   Active  Participation Quality:  Appropriate      Behavioral Observation:  Fairly Groomed, Alert, casual  Current Psychosocial Factors: The patient reports daughter moved out this past Sunday. [name] father is scheduled to have a major surgery this Friday.  Content of Session:   Reviewing symptoms, processing feelings, reinforcing patient's efforts to set and maintain boundaries in the relationship with his daughter and son, encouraging patient to improve self-care  Current Status:   Patient reports improved mood, increased motivation, increased interest in activities, and absence of hallucinations but continued sleep difficulty and decreased appetite. Patient denies suicidal ideations and homicidal ideations.  Patient Progress:   Good.  Patient reports feeling much better since taking Prozac as prescribed by Dr. Lolly Mustache. He reports decreased stress as his daughter moved out this past Sunday after patient set boundaries with her about expectations of financial contributions to the household. Patient is pleased with his efforts to improve assertiveness skills. He reports improvement in relationship with his girlfriend and  states they are planning to remarry in the near future He expresses appropriate concern about his father who is scheduled to have a tumor removed this Friday.     Impression/Diagnosis:   The patient has a long-standing history of recurrent periods of depression along with anxiety and panic attacks. Diagnoses: Maj. depressive disorder, anxiety disorder NOS  Diagnosis:  Axis I:  Major depressive disorder, recurrent, severe with psychotic features          Axis II: Deferred

## 2012-07-15 NOTE — Patient Instructions (Signed)
Discussed orally 

## 2012-07-17 ENCOUNTER — Encounter (HOSPITAL_COMMUNITY): Payer: Self-pay

## 2012-07-17 ENCOUNTER — Ambulatory Visit (HOSPITAL_COMMUNITY)
Admission: RE | Admit: 2012-07-17 | Discharge: 2012-07-17 | Disposition: A | Discharge status: Home / Self Care | Payer: Medicare Other | Source: Ambulatory Visit | Attending: Family Medicine | Admitting: Family Medicine

## 2012-07-17 DIAGNOSIS — R109 Unspecified abdominal pain: Secondary | ICD-10-CM | POA: Insufficient documentation

## 2012-07-17 DIAGNOSIS — R932 Abnormal findings on diagnostic imaging of liver and biliary tract: Secondary | ICD-10-CM | POA: Insufficient documentation

## 2012-07-17 DIAGNOSIS — R1011 Right upper quadrant pain: Secondary | ICD-10-CM

## 2012-07-17 HISTORY — DX: Disorder of kidney and ureter, unspecified: N28.9

## 2012-07-17 HISTORY — DX: Unspecified asthma, uncomplicated: J45.909

## 2012-07-17 MED ORDER — TECHNETIUM TC 99M MEBROFENIN IV KIT
5.0000 | PACK | Freq: Once | INTRAVENOUS | Status: AC | PRN
  Administered 2012-07-17: 5 via INTRAVENOUS

## 2012-07-17 MED ORDER — SINCALIDE 5 MCG IJ SOLR
INTRAMUSCULAR | Status: AC
  Administered 2012-07-17: 2.89 ug via INTRAVENOUS
  Filled 2012-07-17: qty 5

## 2012-07-18 ENCOUNTER — Other Ambulatory Visit: Payer: Self-pay

## 2012-07-18 MED ORDER — OXYCODONE-ACETAMINOPHEN 10-325 MG PO TABS: 1.0000 | ORAL_TABLET | Freq: Four times a day (QID) | ORAL | Status: DC | PRN

## 2012-07-21 ENCOUNTER — Other Ambulatory Visit: Payer: Self-pay | Admitting: Family Medicine

## 2012-07-21 DIAGNOSIS — K819 Cholecystitis, unspecified: Secondary | ICD-10-CM

## 2012-07-24 ENCOUNTER — Encounter: Payer: Self-pay | Admitting: Family Medicine

## 2012-07-24 ENCOUNTER — Ambulatory Visit (INDEPENDENT_AMBULATORY_CARE_PROVIDER_SITE_OTHER): Payer: Medicare Other | Admitting: Family Medicine

## 2012-07-24 VITALS — BP 116/70 | HR 70 | Resp 18 | Ht 73.0 in | Wt 306.0 lb

## 2012-07-24 DIAGNOSIS — F319 Bipolar disorder, unspecified: Secondary | ICD-10-CM

## 2012-07-24 DIAGNOSIS — E669 Obesity, unspecified: Secondary | ICD-10-CM

## 2012-07-24 DIAGNOSIS — M25511 Pain in right shoulder: Secondary | ICD-10-CM

## 2012-07-24 DIAGNOSIS — M24542 Contracture, left hand: Secondary | ICD-10-CM

## 2012-07-24 DIAGNOSIS — M24549 Contracture, unspecified hand: Secondary | ICD-10-CM

## 2012-07-24 DIAGNOSIS — R1013 Epigastric pain: Secondary | ICD-10-CM

## 2012-07-24 DIAGNOSIS — F17200 Nicotine dependence, unspecified, uncomplicated: Secondary | ICD-10-CM

## 2012-07-24 DIAGNOSIS — E785 Hyperlipidemia, unspecified: Secondary | ICD-10-CM

## 2012-07-24 DIAGNOSIS — I10 Essential (primary) hypertension: Secondary | ICD-10-CM

## 2012-07-24 DIAGNOSIS — M25519 Pain in unspecified shoulder: Secondary | ICD-10-CM

## 2012-07-24 NOTE — Patient Instructions (Addendum)
F/u in 4 month, call if you need me before.  Congrats on continued weight loss, keep it up!  You will get appt date with dr Marliss Coots before you leave.  All scripts from this office except pain medication, will be on a 90 day supply,  Pain script today.  You will get info on smoking cessation classes  No change in BP meds at this time per your preference, will re address at next visit

## 2012-07-24 NOTE — Progress Notes (Signed)
  Subjective:    Patient ID: Gavin Gray, male    DOB: May 08, 1965, 47 y.o.   MRN: [id number]  HPI The PT is here for follow up and re-evaluation of chronic medical conditions, medication management and review of any available recent lab and radiology data. Pt has dysfunctional gallbladder with intolerance to most food, has had significant weight loss, he is wanting gallbladder removed as very symtomatic Preventive health is updated, specifically  Cancer screening and Immunization.   The PT denies any adverse reactions to current medications since the last visit.  Feels much better since starting prozac, and he has re established with previous psychiatrist. States mention was made of changing blood pressure meds by his psych , I have no note on this and pt does not want any changes made at this time    Review of Systems See HPI Denies recent fever or chills. Denies sinus pressure, nasal congestion, ear pain or sore throat. Denies chest congestion, productive cough or wheezing. Denies chest pains, palpitations and leg swelling Denies abdominal pain, nausea, vomiting,diarrhea or constipation.   Denies dysuria, frequency, hesitancy or incontinence. Chronic left upper extremity pain unchanged. Denies headaches, seizures, numbness, or tingling. Denies uncontrolled  depression, anxiety or insomnia. Denies skin break down or rash.        Objective:   Physical Exam Patient alert and oriented and in no cardiopulmonary distress.  HEENT: No facial asymmetry, EOMI, no sinus tenderness,  oropharynx pink and moist.  Neck supple no adenopathy.  Chest: Clear to auscultation bilaterally.Decreased though adequate air entry  CVS: S1, S2 no murmurs, no S3.  ABD: Soft non tender. Bowel sounds normal.  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  Psych: Good eye contact, normal affect. Memory  intact not anxious or depressed appearing.  CNS: CN 2-12 intact, power, decreased power in RUE        Assessment & Plan:

## 2012-07-26 NOTE — Assessment & Plan Note (Signed)
Improved. Pt applauded on succesful weight loss through lifestyle change, and encouraged to continue same. Weight loss goal set for the next several months.  

## 2012-07-26 NOTE — Assessment & Plan Note (Signed)
Hyperlipidemia:Low fat diet discussed and encouraged.  Uncontrolled, but improved

## 2012-07-26 NOTE — Assessment & Plan Note (Signed)
Stable and well controlled, has reassumed care through previous psychiatry, states addition of fluoxetine has been a great thing

## 2012-07-26 NOTE — Assessment & Plan Note (Signed)
Chronic pain unchanged

## 2012-07-26 NOTE — Assessment & Plan Note (Signed)
Chronic right upper extremity pain unchanged, meds as before

## 2012-07-26 NOTE — Assessment & Plan Note (Signed)
Controlled, no change in medication DASH diet and commitment to daily physical activity for a minimum of 30 minutes discussed and encouraged, as a part of hypertension management. The importance of attaining a healthy weight is also discussed.  

## 2012-07-26 NOTE — Assessment & Plan Note (Signed)
Dysfunctional gallbladder, pt referred for surgical eval

## 2012-07-26 NOTE — Assessment & Plan Note (Signed)
Cutting down wants to quit, but unable to set quit date at this time Patient counseled for approximately 5 minutes regarding the health risks of ongoing nicotine use, specifically all types of cancer, heart disease, stroke and respiratory failure. The options available for help with cessation ,the behavioral changes to assist the process, and the option to either gradully reduce usage  Or abruptly stop.is also discussed. Pt is also encouraged to set specific goals in number of cigarettes used daily, as well as to set a quit date.

## 2012-07-31 ENCOUNTER — Ambulatory Visit (INDEPENDENT_AMBULATORY_CARE_PROVIDER_SITE_OTHER): Payer: Medicare Other | Admitting: Psychiatry

## 2012-07-31 DIAGNOSIS — F319 Bipolar disorder, unspecified: Secondary | ICD-10-CM

## 2012-07-31 NOTE — Progress Notes (Signed)
Patient:  Gavin Gray   DOB: 08/26/1965  MR Number: [id number]  Location: [facility]:  [address] [place],  [place], [address]  Start:  Thursday 07/31/2012 1:05 PM End:  Thursday 07/31/2012 1:25 PM  Provider/Observer:     Florencia Reasons, MSW, LCSW   Chief Complaint:      Chief Complaint  Patient presents with  . Anxiety    Reason For Service:     The patient initially was referred for services upon his discharge from the [facility] where he was treated for depression and suicidal ideations. Patient has a long-standing history of recurrent periods of depression and anxiety. Patient is seen for a follow up appointment today.  Interventions Strategy:  Supportive therapy, cognitive behavioral therapy  Participation Level:   Active  Participation Quality:  Appropriate      Behavioral Observation:  Fairly Groomed, Alert, casual  Current Psychosocial Factors: The patient's father recently had surgery.  Patient has surgical consult this afternoon to have gallbladder removed. [name] daughter has returned home.  Content of Session:   Reviewing symptoms, processing feelings, reinforcing patient's efforts to set and maintain boundaries in the relationship with his daughter  Current Status:   Patient reports continued improved mood, increased motivation, increased interest in activities, and absence of hallucinations. Patient denies suicidal ideations and homicidal ideations.  Patient Progress:   Good.  Patient reports continuing to feel much better since taking Prozac as prescribed by Dr. Lolly Mustache. He states having no agitation and anger. He  approaches and interacts with people more positively per his report. His daughter has returned home temporarily but patient reports being able to successfully set and maintain boundaries with his daughter. Patient reports continued strong support from his wife.   Impression/Diagnosis:   The patient has a long-standing  history of recurrent periods of depression along with anxiety and panic attacks. He also has a history of explosive anger outburst and mood swings Diagnoses: Bipolar  disorder, anxiety disorder NOS  Diagnosis:  Axis I:  Bipolar disorder, unspecified          Axis II: Deferred

## 2012-07-31 NOTE — Patient Instructions (Signed)
Discussed orally 

## 2012-08-01 ENCOUNTER — Encounter (HOSPITAL_COMMUNITY): Payer: Self-pay | Admitting: Pharmacy Technician

## 2012-08-06 ENCOUNTER — Encounter (HOSPITAL_COMMUNITY)
Admission: RE | Admit: 2012-08-06 | Discharge: 2012-08-06 | Disposition: A | Discharge status: Home / Self Care | Payer: Medicare Other | Source: Ambulatory Visit | Attending: General Surgery | Admitting: General Surgery

## 2012-08-06 ENCOUNTER — Other Ambulatory Visit: Payer: Self-pay

## 2012-08-06 ENCOUNTER — Encounter (HOSPITAL_COMMUNITY): Payer: Self-pay

## 2012-08-06 DIAGNOSIS — Z01812 Encounter for preprocedural laboratory examination: Secondary | ICD-10-CM | POA: Insufficient documentation

## 2012-08-06 DIAGNOSIS — Z01810 Encounter for preprocedural cardiovascular examination: Secondary | ICD-10-CM | POA: Insufficient documentation

## 2012-08-06 DIAGNOSIS — J449 Chronic obstructive pulmonary disease, unspecified: Secondary | ICD-10-CM | POA: Insufficient documentation

## 2012-08-06 DIAGNOSIS — K828 Other specified diseases of gallbladder: Secondary | ICD-10-CM | POA: Insufficient documentation

## 2012-08-06 DIAGNOSIS — J4489 Other specified chronic obstructive pulmonary disease: Secondary | ICD-10-CM | POA: Insufficient documentation

## 2012-08-06 DIAGNOSIS — I10 Essential (primary) hypertension: Secondary | ICD-10-CM | POA: Insufficient documentation

## 2012-08-06 DIAGNOSIS — Z5320 Procedure and treatment not carried out because of patient's decision for unspecified reasons: Secondary | ICD-10-CM | POA: Insufficient documentation

## 2012-08-06 HISTORY — DX: Chronic obstructive pulmonary disease, unspecified: J44.9

## 2012-08-06 LAB — BASIC METABOLIC PANEL
BUN: 16 mg/dL (ref 6–23)
Chloride: 97 mEq/L (ref 96–112)
GFR calc Af Amer: 60 mL/min — ABNORMAL LOW (ref >90)
Potassium: 4.9 mEq/L (ref 3.5–5.1)

## 2012-08-06 LAB — SURGICAL PCR SCREEN: Staphylococcus aureus: NEGATIVE

## 2012-08-06 LAB — CBC
HCT: 45.1 % (ref 39.0–52.0)
Hemoglobin: 15.3 g/dL (ref 13.0–17.0)
RDW: 14 % (ref 11.5–15.5)
WBC: 11.3 10*3/uL — ABNORMAL HIGH (ref 4.0–10.5)

## 2012-08-06 MED ORDER — AMLODIPINE BESY-BENAZEPRIL HCL 10-40 MG PO CAPS: 1.0000 | ORAL_CAPSULE | Freq: Every day | ORAL | Status: DC

## 2012-08-06 MED ORDER — PROPRANOLOL HCL 20 MG PO TABS: 20.0000 mg | ORAL_TABLET | Freq: Three times a day (TID) | ORAL | Status: DC

## 2012-08-06 MED ORDER — RANITIDINE HCL 150 MG PO TABS: 150.0000 mg | ORAL_TABLET | Freq: Two times a day (BID) | ORAL | Status: DC

## 2012-08-06 NOTE — Patient Instructions (Addendum)
JOHNHENRY TIPPIN  08/06/2012   Your procedure is scheduled on:   08/13/2012  Report to [facility] at  615  AM.  Call this number if you have problems the morning of surgery: [phone]   Remember:   Do not eat food or drink liquids after midnight.   Take these medicines the morning of surgery with A SIP OF WATER:  Xanax, lotrel,cogentin,voltaren, nexium,prozac,haldol,zantac, maxalt, inderal, neurontin, percocet. Take duoneb, flonase and proventil before youo come.   Do not wear jewelry, make-up or nail polish.  Do not wear lotions, powders, or perfumes.   Do not shave 48 hours prior to surgery. Men may shave face and neck.  Do not bring valuables to the hospital.  [facility] is not responsible  for any belongings or valuables.  Contacts, dentures or bridgework may not be worn into surgery.  Leave suitcase in the car. After surgery it may be brought to your room.  For patients admitted to the hospital, checkout time is 11:00 AM the day of discharge.   Patients discharged the day of surgery will not be allowed to drive home.  Name and phone number of your driver: family  Special Instructions: Shower using CHG 2 nights before surgery and the night before surgery.  If you shower the day of surgery use CHG.  Use special wash - you have one bottle of CHG for all showers.  You should use approximately 1/3 of the bottle for each shower.   Please read over the following fact sheets that you were given: Pain Booklet, Coughing and Deep Breathing, MRSA Information, Surgical Site Infection Prevention, Anesthesia Post-op Instructions and Care and Recovery After Surgery Laparoscopic Cholecystectomy Laparoscopic cholecystectomy is surgery to remove the gallbladder. The gallbladder is located slightly to the right of center in the abdomen, behind the liver. It is a concentrating and storage sac for the bile produced in the liver. Bile aids in the digestion and absorption of fats. Gallbladder disease  (cholecystitis) is an inflammation of your gallbladder. This condition is usually caused by a buildup of gallstones (cholelithiasis) in your gallbladder. Gallstones can block the flow of bile, resulting in inflammation and pain. In severe cases, emergency surgery may be required. When emergency surgery is not required, you will have time to prepare for the procedure. Laparoscopic surgery is an alternative to open surgery. Laparoscopic surgery usually has a shorter recovery time. Your common bile duct may also need to be examined and explored. Your caregiver will discuss this with you if he or she feels this should be done. If stones are found in the common bile duct, they may be removed. LET YOUR CAREGIVER KNOW ABOUT:  Allergies to food or medicine.  Medicines taken, including vitamins, herbs, eyedrops, over-the-counter medicines, and creams.  Use of steroids (by mouth or creams).  Previous problems with anesthetics or numbing medicines.  History of bleeding problems or blood clots.  Previous surgery.  Other health problems, including diabetes and kidney problems.  Possibility of pregnancy, if this applies. RISKS AND COMPLICATIONS All surgery is associated with risks. Some problems that may occur following this procedure include:  Infection.  Damage to the common bile duct, nerves, arteries, veins, or other internal organs such as the stomach or intestines.  Bleeding.  A stone may remain in the common bile duct. BEFORE THE PROCEDURE  Do not take aspirin for 3 days prior to surgery or blood thinners for 1 week prior to surgery.  Do not eat  or drink anything after midnight the night before surgery.  Let your caregiver know if you develop a cold or other infectious problem prior to surgery.  You should be present 60 minutes before the procedure or as directed. PROCEDURE  You will be given medicine that makes you sleep (general anesthetic). When you are asleep, your surgeon will  make several small cuts (incisions) in your abdomen. One of these incisions is used to insert a small, lighted scope (laparoscope) into the abdomen. The laparoscope helps the surgeon see into your abdomen. Carbon dioxide gas will be pumped into your abdomen. The gas allows more room for the surgeon to perform your surgery. Other operating instruments are inserted through the other incisions. Laparoscopic procedures may not be appropriate when:  There is major scarring from previous surgery.  The gallbladder is extremely inflamed.  There are bleeding disorders or unexpected cirrhosis of the liver.  A pregnancy is near term.  Other conditions make the laparoscopic procedure impossible. If your surgeon feels it is not safe to continue with a laparoscopic procedure, he or she will perform an open abdominal procedure. In this case, the surgeon will make an incision to open the abdomen. This gives the surgeon a larger view and field to work within. This may allow the surgeon to perform procedures that sometimes cannot be performed with a laparoscope alone. Open surgery has a longer recovery time. AFTER THE PROCEDURE  You will be taken to the recovery area where a nurse will watch and check your progress.  You may be allowed to go home the same day.  Do not resume physical activities until directed by your caregiver.  You may resume a normal diet and activities as directed. Document Released: 02/12/2005 Document Revised: 05/07/2011 Document Reviewed: 07/28/2010 [facility] Patient Information 2014 [place], [place]. PATIENT INSTRUCTIONS POST-ANESTHESIA  IMMEDIATELY FOLLOWING SURGERY:  Do not drive or operate machinery for the first twenty four hours after surgery.  Do not make any important decisions for twenty four hours after surgery or while taking narcotic pain medications or sedatives.  If you develop intractable nausea and vomiting or a severe headache please notify your doctor  immediately.  FOLLOW-UP:  Please make an appointment with your surgeon as instructed. You do not need to follow up with anesthesia unless specifically instructed to do so.  WOUND CARE INSTRUCTIONS (if applicable):  Keep a dry clean dressing on the anesthesia/puncture wound site if there is drainage.  Once the wound has quit draining you may leave it open to air.  Generally you should leave the bandage intact for twenty four hours unless there is drainage.  If the epidural site drains for more than 36-48 hours please call the anesthesia department.  QUESTIONS?:  Please feel free to call your physician or the hospital operator if you have any questions, and they will be happy to assist you.

## 2012-08-12 ENCOUNTER — Encounter (HOSPITAL_COMMUNITY): Payer: Self-pay | Admitting: Anesthesiology
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
## 2012-08-13 ENCOUNTER — Encounter (HOSPITAL_COMMUNITY): Admission: RE | Payer: Self-pay | Source: Ambulatory Visit

## 2012-08-13 ENCOUNTER — Ambulatory Visit (HOSPITAL_COMMUNITY): Admission: RE | Admit: 2012-08-13 | Payer: Medicare Other | Source: Ambulatory Visit | Admitting: General Surgery

## 2012-08-13 SURGERY — LAPAROSCOPIC CHOLECYSTECTOMY
Anesthesia: General

## 2012-08-13 NOTE — OR Nursing (Signed)
Pt. Called and states he has a family emergency, his mother has had a heart attack.Wants to cancel surgery for now. Dr. Leticia Penna aware.

## 2012-08-14 ENCOUNTER — Other Ambulatory Visit: Payer: Self-pay

## 2012-08-14 MED ORDER — OXYCODONE-ACETAMINOPHEN 10-325 MG PO TABS: 1.0000 | ORAL_TABLET | Freq: Four times a day (QID) | ORAL | Status: DC | PRN

## 2012-08-18 ENCOUNTER — Encounter (HOSPITAL_COMMUNITY): Payer: Self-pay | Admitting: Pharmacy Technician

## 2012-08-21 ENCOUNTER — Ambulatory Visit (HOSPITAL_COMMUNITY): Payer: Self-pay | Admitting: Psychiatry

## 2012-08-26 ENCOUNTER — Other Ambulatory Visit: Payer: Self-pay | Admitting: Family Medicine

## 2012-08-27 ENCOUNTER — Encounter (HOSPITAL_COMMUNITY)
Admission: RE | Admit: 2012-08-27 | Discharge: 2012-08-27 | Disposition: A | Discharge status: Home / Self Care | Payer: Medicare Other | Source: Ambulatory Visit

## 2012-08-27 ENCOUNTER — Encounter (HOSPITAL_COMMUNITY): Payer: Self-pay

## 2012-09-01 ENCOUNTER — Ambulatory Visit (INDEPENDENT_AMBULATORY_CARE_PROVIDER_SITE_OTHER): Payer: Medicare Other | Admitting: Psychiatry

## 2012-09-01 ENCOUNTER — Encounter (HOSPITAL_COMMUNITY): Payer: Self-pay | Admitting: Psychiatry

## 2012-09-01 VITALS — BP 149/76 | HR 68 | Ht 71.0 in | Wt 302.2 lb

## 2012-09-01 DIAGNOSIS — F329 Major depressive disorder, single episode, unspecified: Secondary | ICD-10-CM

## 2012-09-01 DIAGNOSIS — F29 Unspecified psychosis not due to a substance or known physiological condition: Secondary | ICD-10-CM

## 2012-09-01 DIAGNOSIS — F319 Bipolar disorder, unspecified: Secondary | ICD-10-CM

## 2012-09-01 DIAGNOSIS — F1011 Alcohol abuse, in remission: Secondary | ICD-10-CM

## 2012-09-01 MED ORDER — BENZTROPINE MESYLATE 2 MG PO TABS: 2.0000 mg | ORAL_TABLET | Freq: Every day | ORAL | Status: DC

## 2012-09-01 MED ORDER — HALOPERIDOL 10 MG PO TABS: ORAL_TABLET | ORAL | Status: DC

## 2012-09-01 MED ORDER — ALPRAZOLAM 1 MG PO TABS: 1.0000 mg | ORAL_TABLET | Freq: Three times a day (TID) | ORAL | Status: DC | PRN

## 2012-09-01 MED ORDER — FLUOXETINE HCL 20 MG PO CAPS: 20.0000 mg | ORAL_CAPSULE | Freq: Every day | ORAL | Status: DC
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
## 2012-09-02 ENCOUNTER — Other Ambulatory Visit: Payer: Self-pay | Admitting: Family Medicine

## 2012-09-02 ENCOUNTER — Ambulatory Visit (HOSPITAL_COMMUNITY): Payer: Self-pay | Admitting: Psychiatry

## 2012-09-03 ENCOUNTER — Encounter (HOSPITAL_COMMUNITY): Payer: Self-pay | Admitting: *Deleted

## 2012-09-03 ENCOUNTER — Encounter (HOSPITAL_COMMUNITY)
Admission: RE | Disposition: A | Discharge status: Home / Self Care | Payer: Self-pay | Source: Ambulatory Visit | Attending: General Surgery

## 2012-09-03 ENCOUNTER — Ambulatory Visit (HOSPITAL_COMMUNITY): Payer: Medicare Other | Admitting: Anesthesiology

## 2012-09-03 ENCOUNTER — Encounter (HOSPITAL_COMMUNITY): Payer: Self-pay | Admitting: Anesthesiology

## 2012-09-03 ENCOUNTER — Ambulatory Visit (HOSPITAL_COMMUNITY)
Admission: RE | Admit: 2012-09-03 | Discharge: 2012-09-03 | Disposition: A | Discharge status: Home / Self Care | Payer: Medicare Other | Source: Ambulatory Visit | Attending: General Surgery | Admitting: General Surgery

## 2012-09-03 DIAGNOSIS — Z6841 Body Mass Index (BMI) 40.0 and over, adult: Secondary | ICD-10-CM | POA: Insufficient documentation

## 2012-09-03 DIAGNOSIS — K8000 Calculus of gallbladder with acute cholecystitis without obstruction: Secondary | ICD-10-CM

## 2012-09-03 DIAGNOSIS — J449 Chronic obstructive pulmonary disease, unspecified: Secondary | ICD-10-CM | POA: Insufficient documentation

## 2012-09-03 DIAGNOSIS — K828 Other specified diseases of gallbladder: Secondary | ICD-10-CM | POA: Insufficient documentation

## 2012-09-03 DIAGNOSIS — J4489 Other specified chronic obstructive pulmonary disease: Secondary | ICD-10-CM | POA: Insufficient documentation

## 2012-09-03 DIAGNOSIS — I10 Essential (primary) hypertension: Secondary | ICD-10-CM | POA: Insufficient documentation

## 2012-09-03 HISTORY — PX: CHOLECYSTECTOMY: SHX55

## 2012-09-03 SURGERY — LAPAROSCOPIC CHOLECYSTECTOMY
Anesthesia: General | Site: Abdomen | Wound class: Contaminated

## 2012-09-03 MED ORDER — FENTANYL CITRATE 0.05 MG/ML IJ SOLN
INTRAMUSCULAR | Status: AC
  Filled 2012-09-03: qty 2

## 2012-09-03 MED ORDER — ONDANSETRON HCL 4 MG/2ML IJ SOLN: 4.0000 mg | Freq: Once | INTRAMUSCULAR | Status: DC | PRN

## 2012-09-03 MED ORDER — NEOSTIGMINE METHYLSULFATE 1 MG/ML IJ SOLN
INTRAMUSCULAR | Status: DC | PRN
  Administered 2012-09-03: 3 mg via INTRAVENOUS

## 2012-09-03 MED ORDER — GLYCOPYRROLATE 0.2 MG/ML IJ SOLN
INTRAMUSCULAR | Status: AC
  Filled 2012-09-03: qty 1

## 2012-09-03 MED ORDER — CEFAZOLIN SODIUM-DEXTROSE 2-3 GM-% IV SOLR: 2.0000 g | INTRAVENOUS | Status: DC

## 2012-09-03 MED ORDER — BUPIVACAINE HCL (PF) 0.5 % IJ SOLN
INTRAMUSCULAR | Status: AC
  Filled 2012-09-03: qty 30

## 2012-09-03 MED ORDER — MIDAZOLAM HCL 5 MG/5ML IJ SOLN
INTRAMUSCULAR | Status: DC | PRN
  Administered 2012-09-03: 2 mg via INTRAVENOUS

## 2012-09-03 MED ORDER — GLYCOPYRROLATE 0.2 MG/ML IJ SOLN
INTRAMUSCULAR | Status: AC
  Filled 2012-09-03: qty 2

## 2012-09-03 MED ORDER — GLYCOPYRROLATE 0.2 MG/ML IJ SOLN
INTRAMUSCULAR | Status: DC | PRN
  Administered 2012-09-03: 0.4 mg via INTRAVENOUS

## 2012-09-03 MED ORDER — ONDANSETRON HCL 4 MG/2ML IJ SOLN
INTRAMUSCULAR | Status: AC
  Filled 2012-09-03: qty 2

## 2012-09-03 MED ORDER — BUPIVACAINE HCL (PF) 0.5 % IJ SOLN
INTRAMUSCULAR | Status: DC | PRN
  Administered 2012-09-03: 10 mL

## 2012-09-03 MED ORDER — LACTATED RINGERS IV SOLN
INTRAVENOUS | Status: DC
  Administered 2012-09-03: 1000 mL via INTRAVENOUS

## 2012-09-03 MED ORDER — CEFAZOLIN SODIUM 1-5 GM-% IV SOLN
INTRAVENOUS | Status: AC
  Filled 2012-09-03: qty 50

## 2012-09-03 MED ORDER — FENTANYL CITRATE 0.05 MG/ML IJ SOLN
INTRAMUSCULAR | Status: DC | PRN
  Administered 2012-09-03: 50 ug via INTRAVENOUS
  Administered 2012-09-03 (×3): 100 ug via INTRAVENOUS

## 2012-09-03 MED ORDER — PROPOFOL 10 MG/ML IV EMUL
INTRAVENOUS | Status: AC
  Filled 2012-09-03: qty 20

## 2012-09-03 MED ORDER — ONDANSETRON HCL 4 MG/2ML IJ SOLN
4.0000 mg | Freq: Once | INTRAMUSCULAR | Status: AC
  Administered 2012-09-03: 4 mg via INTRAVENOUS

## 2012-09-03 MED ORDER — FENTANYL CITRATE 0.05 MG/ML IJ SOLN
25.0000 ug | INTRAMUSCULAR | Status: DC | PRN
  Administered 2012-09-03 (×2): 50 ug via INTRAVENOUS

## 2012-09-03 MED ORDER — SUCCINYLCHOLINE CHLORIDE 20 MG/ML IJ SOLN
INTRAMUSCULAR | Status: DC | PRN
  Administered 2012-09-03: 200 mg via INTRAVENOUS

## 2012-09-03 MED ORDER — MIDAZOLAM HCL 2 MG/2ML IJ SOLN
INTRAMUSCULAR | Status: AC
  Filled 2012-09-03: qty 2

## 2012-09-03 MED ORDER — CEFAZOLIN SODIUM-DEXTROSE 2-3 GM-% IV SOLR
INTRAVENOUS | Status: AC
  Filled 2012-09-03: qty 50

## 2012-09-03 MED ORDER — PROPOFOL 10 MG/ML IV BOLUS
INTRAVENOUS | Status: DC | PRN
  Administered 2012-09-03: 180 mg via INTRAVENOUS

## 2012-09-03 MED ORDER — DEXAMETHASONE SODIUM PHOSPHATE 4 MG/ML IJ SOLN
INTRAMUSCULAR | Status: AC
  Filled 2012-09-03: qty 1

## 2012-09-03 MED ORDER — ROCURONIUM BROMIDE 100 MG/10ML IV SOLN
INTRAVENOUS | Status: DC | PRN
  Administered 2012-09-03: 30 mg via INTRAVENOUS

## 2012-09-03 MED ORDER — FENTANYL CITRATE 0.05 MG/ML IJ SOLN
INTRAMUSCULAR | Status: AC
  Filled 2012-09-03: qty 5

## 2012-09-03 MED ORDER — SUCCINYLCHOLINE CHLORIDE 20 MG/ML IJ SOLN
INTRAMUSCULAR | Status: AC
  Filled 2012-09-03: qty 1

## 2012-09-03 MED ORDER — ROCURONIUM BROMIDE 50 MG/5ML IV SOLN
INTRAVENOUS | Status: AC
  Filled 2012-09-03: qty 1

## 2012-09-03 MED ORDER — MIDAZOLAM HCL 2 MG/2ML IJ SOLN
1.0000 mg | INTRAMUSCULAR | Status: DC | PRN
  Administered 2012-09-03 (×2): 2 mg via INTRAVENOUS

## 2012-09-03 MED ORDER — DEXTROSE 5 % IV SOLN
3.0000 g | Freq: Once | INTRAVENOUS | Status: AC
  Administered 2012-09-03: 3 g via INTRAVENOUS
  Filled 2012-09-03: qty 3000

## 2012-09-03 MED ORDER — SODIUM CHLORIDE 0.9 % IR SOLN
Status: DC | PRN
  Administered 2012-09-03: 3000 mL

## 2012-09-03 MED ORDER — LIDOCAINE HCL (PF) 1 % IJ SOLN
INTRAMUSCULAR | Status: AC
  Filled 2012-09-03: qty 5

## 2012-09-03 MED ORDER — GLYCOPYRROLATE 0.2 MG/ML IJ SOLN
0.2000 mg | Freq: Once | INTRAMUSCULAR | Status: AC
  Administered 2012-09-03: 0.2 mg via INTRAVENOUS

## 2012-09-03 MED ORDER — DEXAMETHASONE SODIUM PHOSPHATE 4 MG/ML IJ SOLN
4.0000 mg | Freq: Once | INTRAMUSCULAR | Status: AC
  Administered 2012-09-03: 4 mg via INTRAVENOUS

## 2012-09-03 MED ORDER — LIDOCAINE HCL 1 % IJ SOLN
INTRAMUSCULAR | Status: DC | PRN
  Administered 2012-09-03: 50 mg via INTRADERMAL

## 2012-09-03 MED ORDER — SODIUM CHLORIDE 0.9 % IR SOLN
Status: DC | PRN
  Administered 2012-09-03: 1000 mL

## 2012-09-03 SURGICAL SUPPLY — 44 items
APL SKNCLS STERI-STRIP NONHPOA (GAUZE/BANDAGES/DRESSINGS) ×1
APPLIER CLIP UNV 5X34 EPIX (ENDOMECHANICALS) ×2 IMPLANT
APR XCLPCLP 20M/L UNV 34X5 (ENDOMECHANICALS) ×1
BAG HAMPER (MISCELLANEOUS) ×2 IMPLANT
BAG SPEC RTRVL LRG 6X4 10 (ENDOMECHANICALS) ×1
BENZOIN TINCTURE PRP APPL 2/3 (GAUZE/BANDAGES/DRESSINGS) ×2 IMPLANT
CLOTH BEACON ORANGE TIMEOUT ST (SAFETY) ×2 IMPLANT
COVER LIGHT HANDLE STERIS (MISCELLANEOUS) ×4 IMPLANT
DECANTER SPIKE VIAL GLASS SM (MISCELLANEOUS) ×2 IMPLANT
DEVICE TROCAR PUNCTURE CLOSURE (ENDOMECHANICALS) ×2 IMPLANT
DURAPREP 26ML APPLICATOR (WOUND CARE) ×2 IMPLANT
ELECT REM PT RETURN 9FT ADLT (ELECTROSURGICAL) ×2
ELECTRODE REM PT RTRN 9FT ADLT (ELECTROSURGICAL) ×1 IMPLANT
FILTER SMOKE EVAC LAPAROSHD (FILTER) ×2 IMPLANT
FORMALIN 10 PREFIL 120ML (MISCELLANEOUS) ×2 IMPLANT
GLOVE BIOGEL PI IND STRL 7.5 (GLOVE) ×1 IMPLANT
GLOVE BIOGEL PI INDICATOR 7.5 (GLOVE) ×1
GLOVE ECLIPSE 6.5 STRL STRAW (GLOVE) ×1 IMPLANT
GLOVE ECLIPSE 7.0 STRL STRAW (GLOVE) ×2 IMPLANT
GLOVE INDICATOR 7.0 STRL GRN (GLOVE) ×2 IMPLANT
GLOVE INDICATOR 7.5 STRL GRN (GLOVE) ×1 IMPLANT
GLOVE SS BIOGEL STRL SZ 6.5 (GLOVE) IMPLANT
GLOVE SUPERSENSE BIOGEL SZ 6.5 (GLOVE) ×1
GOWN STRL REIN XL XLG (GOWN DISPOSABLE) ×7 IMPLANT
HEMOSTAT SNOW SURGICEL 2X4 (HEMOSTASIS) ×2 IMPLANT
INST SET LAPROSCOPIC AP (KITS) ×2 IMPLANT
IV NS IRRIG 3000ML ARTHROMATIC (IV SOLUTION) ×1 IMPLANT
KIT ROOM TURNOVER APOR (KITS) ×2 IMPLANT
MANIFOLD NEPTUNE II (INSTRUMENTS) ×2 IMPLANT
NDL INSUFFLATION 14GA 120MM (NEEDLE) ×1 IMPLANT
NEEDLE INSUFFLATION 14GA 120MM (NEEDLE) ×2 IMPLANT
PACK LAP CHOLE LZT030E (CUSTOM PROCEDURE TRAY) ×2 IMPLANT
PAD ARMBOARD 7.5X6 YLW CONV (MISCELLANEOUS) ×2 IMPLANT
POUCH SPECIMEN RETRIEVAL 10MM (ENDOMECHANICALS) ×2 IMPLANT
SET BASIN LINEN APH (SET/KITS/TRAYS/PACK) ×2 IMPLANT
SET TUBE IRRIG SUCTION NO TIP (IRRIGATION / IRRIGATOR) ×1 IMPLANT
SLEEVE Z-THREAD 5X100MM (TROCAR) ×4 IMPLANT
STRIP CLOSURE SKIN 1/2X4 (GAUZE/BANDAGES/DRESSINGS) ×2 IMPLANT
SUT MNCRL AB 4-0 PS2 18 (SUTURE) ×4 IMPLANT
SUT VIC AB 2-0 CT2 27 (SUTURE) ×3 IMPLANT
TROCAR Z-THRD FIOS HNDL 11X100 (TROCAR) ×2 IMPLANT
TROCAR Z-THREAD FIOS 5X100MM (TROCAR) ×2 IMPLANT
TUBING INSUFFLATION (TUBING) ×2 IMPLANT
WARMER LAPAROSCOPE (MISCELLANEOUS) ×2 IMPLANT

## 2012-09-03 NOTE — Op Note (Signed)
Patient:  Gavin Gray  DOB:  Apr 04, 1965  MRN:  [id number]   Preop Diagnosis:  Biliary dyskinesia  Postop Diagnosis:  The same  Procedure:  Laparoscopic cholecystectomy  Surgeon:  Dr. Tilford Pillar  Anes:  General anesthesia, 0.5% Sensorcaine plain for local  Indications:  Patient is a 47 year old male presented my office with a history of right upper quadrant abdominal pain. And HIDA scan demonstrated decreased biliary ejection fraction consistent with biliary dyskinesia. He was symptomatic.  [name] questions and concerns were addressed and risks benefits alternatives a laparoscopic possible open cholecystectomy were discussed at length the patient including but not limited to risk of bleeding, infection, bile leak, small bowel injury, common bile duct injury, intraoperative cardiac and pulmonary events. [name] questions and concerns are addressed the patient was consented for the planned procedure.  Procedure note:  Patient was taken to the operator is placed in supine position the or table time the general anesthetic is administered. Once patient was asleep he was endotracheally intubated by the nurse anesthetist. At this point his abdomen is prepped with DuraPrep solution and draped in standard fashion. Time out was performed.  A stab incision was created supraumbilically with 11 blade scalpel with additional dissection down to the tissue carried out using a Coker clamp which he utilized grasp the anterior normal fascia and lift his anteriorly. A Veress needle is inserted saline drop test is utilized confirm intraperitoneal placement. At this time the pneumoperitoneum was initiated and once sufficient pneumoperitoneum was obtained an 11 mm trochars inserted over a lap scope allowing visualization the trocar entering into the peritoneal cavity. At this point the inner cannulas removed lap strips reinserted there is no evidence a trocar peritoneal placement injury. At this time the  remaining trochars replaced a 5 mm can epigastrium, 5 monitored in the midline, biventricular abdominal wall. [name] placed into a reverse Trendelenburg left lateral decubitus position. The fundus of the gallbladder is grasped and lifted up and over the right lobe the liver. Due to the thin-walled nature the gallbladder a small cholecystotomy was created during this maneuver was some bile spillage. This was quickly controlled with a grasper in the spilled bile was aspirated with a suction irrigator. The infundibulum was identified. The peritoneal reflection onto the end of the abdomen was bluntly stripped off using a [place] exposing both the cystic duct and cystic arteries and her into the infundibulum. A window was created behind both the structures. 3 endoclips placed proximally one distally behind the cystic duct and the cystic duct was divided between 2 most is a clips. Still orally the cystic artery is ligated with 2 endoclips proximally one distally and the cystic arteries divided between 2 most distal clips. At this time which cauteries utilized dissect the gallbladder free from the gallbladder fossa. Once gallbladder is free is placed into an Endo Catch bag and placed into the right lower quadrant.  To facilitate this the 10 mm scope is exchanged for a 5 mm scope. Hemostasis was obtained in the gallbladder fossa using electrocautery. The abdominal cavity was copiously irrigated with warm sterile saline. Returning aspirate was clear. At this time attention was turned to closure.  Using Endo Close suture passing device a 2-0 Vicryl sutures passed to the umbilical trocar site. With this suture and placed the gallbladder was retrieved was removed through the umbilical trocar site and intact Endo Catch bag. It was placed in the back table and sent as a perm specimen to pathology. At this time  the pneumoperitoneum was evacuated. Trochars removed. The Vicryl sutures secured. Local anesthetic is  instilled. A 4-0 Monocryl utilized reapproximate the skin edges at all 4 trocar sites. The skin was washed dried moist dry towel. Benzoin is applied around incision. Half-inch are suture placed. The drapes removed patient left come out of general anesthetic. He is transferred to the PACU in stable condition. At the conclusion of procedure all instrument, sponge, needle counts are correct. Patient tolerated procedure extremely well.  Complications:  None apparent  EBL:  Minimal  Specimen:  Gallbladder
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
## 2012-09-03 NOTE — Anesthesia Postprocedure Evaluation (Signed)
  Anesthesia Post-op Note  Patient: Gavin Gray  Procedure(s) Performed: Procedure(s): LAPAROSCOPIC CHOLECYSTECTOMY (N/A)  Patient Location: PACU  Anesthesia Type:General  Level of Consciousness: awake, alert , oriented and patient cooperative  Airway and Oxygen Therapy: Patient Spontanous Breathing  Post-op Pain: 4 /10, mild  Post-op Assessment: Post-op Vital signs reviewed, [name] Cardiovascular Status Stable, Respiratory Function Stable, Patent Airway, No signs of Nausea or vomiting and Pain level controlled  Post-op Vital Signs: Reviewed and stable  Complications: No apparent anesthesia complications

## 2012-09-03 NOTE — Anesthesia Preprocedure Evaluation (Addendum)
Anesthesia Evaluation  Patient identified by MRN, date of birth, ID band Patient awake    Reviewed: Allergy & Precautions, H&P , NPO status , [name] Chart, lab work & pertinent test results  History of Anesthesia Complications Negative for: history of anesthetic complications  Airway Mallampati: II      Dental  (+) Teeth Intact   Pulmonary COPD COPD inhaler,  breath sounds clear to auscultation        Cardiovascular hypertension, Pt. on medications Rhythm:Regular Rate:Normal     Neuro/Psych  Headaches, PSYCHIATRIC DISORDERS Anxiety Depression Bipolar Disorder Schizophrenia    GI/Hepatic GERD- (dysphagia)  Medicated,  Endo/Other  diabetesMorbid obesity  Renal/GU Renal InsufficiencyRenal disease     Musculoskeletal   Abdominal (+) + obese,   Peds  Hematology   Anesthesia Other Findings   Reproductive/Obstetrics                          Anesthesia Physical Anesthesia Plan  ASA: III  Anesthesia Plan: General   Post-op Pain Management:    Induction: Intravenous, Cricoid pressure planned and Rapid sequence  Airway Management Planned: Oral ETT  Additional Equipment:   Intra-op Plan:   Post-operative Plan: Extubation in OR  Informed Consent: I have reviewed the patients History and Physical, chart, labs and discussed the procedure including the risks, benefits and alternatives for the proposed anesthesia with the patient or authorized representative who has indicated his/her understanding and acceptance.     Plan Discussed with:   Anesthesia Plan Comments:         Anesthesia Quick Evaluation

## 2012-09-03 NOTE — Anesthesia Procedure Notes (Signed)
Procedure Name: Intubation Date/Time: 09/03/2012 9:06 AM Performed by: Despina Hidden Pre-anesthesia Checklist: Emergency Drugs available, Suction available, Patient being monitored and Patient identified Patient Re-evaluated:Patient Re-evaluated prior to inductionOxygen Delivery Method: Circle system utilized Preoxygenation: Pre-oxygenation with 100% oxygen Intubation Type: IV induction, Rapid sequence and Cricoid Pressure applied Ventilation: Mask ventilation without difficulty and Oral airway inserted - appropriate to patient size Laryngoscope Size: 3 and Mac Grade View: Grade I Tube type: Oral Tube size: 8.0 mm Number of attempts: 1 Airway Equipment and Method: Stylet Placement Confirmation: ETT inserted through vocal cords under direct vision,  breath sounds checked- equal and bilateral and positive ETCO2 Secured at: 22 cm Tube secured with: Tape Dental Injury: Teeth and Oropharynx as per pre-operative assessment

## 2012-09-03 NOTE — Transfer of Care (Signed)
Immediate Anesthesia Transfer of Care Note  Patient: Gavin Gray  Procedure(s) Performed: Procedure(s): LAPAROSCOPIC CHOLECYSTECTOMY (N/A)  Patient Location: PACU  Anesthesia Type:General  Level of Consciousness: awake, alert  and patient cooperative  Airway & Oxygen Therapy: Patient Spontanous Breathing and Patient connected to face mask oxygen  Post-op Assessment: Report given to PACU RN, Post -op Vital signs reviewed and stable and Patient moving all extremities  Post vital signs: Reviewed and stable  Complications: No apparent anesthesia complications

## 2012-09-03 NOTE — Interval H&P Note (Signed)
History and Physical Interval Note:  09/03/2012 8:47 AM  Gavin Gray  has presented today for surgery, with the diagnosis of biliary dyskenesia   The various methods of treatment have been discussed with the patient and family. After consideration of risks, benefits and other options for treatment, the patient has consented to  Procedure(s): LAPAROSCOPIC CHOLECYSTECTOMY (N/A) as a surgical intervention .  The patient's history has been reviewed, patient examined, no change in status, stable for surgery.  I have reviewed the patient's chart and labs.  Questions were answered to the patient's satisfaction.     Kamil Mchaffie C

## 2012-09-05 ENCOUNTER — Telehealth: Payer: Self-pay | Admitting: Family Medicine

## 2012-09-05 ENCOUNTER — Encounter (HOSPITAL_COMMUNITY): Payer: Self-pay | Admitting: General Surgery

## 2012-09-05 NOTE — Telephone Encounter (Signed)
Form received. Put in Dr's box for signature

## 2012-09-08 NOTE — Telephone Encounter (Signed)
Form faxed back.

## 2012-09-10 ENCOUNTER — Ambulatory Visit (HOSPITAL_COMMUNITY): Payer: Medicare Other | Admitting: Psychiatry

## 2012-09-11 ENCOUNTER — Other Ambulatory Visit: Payer: Self-pay

## 2012-09-11 MED ORDER — OXYCODONE-ACETAMINOPHEN 10-325 MG PO TABS: 1.0000 | ORAL_TABLET | Freq: Four times a day (QID) | ORAL | Status: DC | PRN

## 2012-09-15 ENCOUNTER — Telehealth (HOSPITAL_COMMUNITY): Payer: Self-pay | Admitting: *Deleted

## 2012-09-15 DIAGNOSIS — F319 Bipolar disorder, unspecified: Secondary | ICD-10-CM

## 2012-09-15 NOTE — Telephone Encounter (Signed)
VM from pt: Last appointment, MD increased Prozac from 10 mg to 20 mg. Now seeing triple and feeling agitated. Was feeling good on 10 mg dose and doing well.  Would like to go back to 10 mg dose. Please let him know if that can be changed and sent to pharamcy

## 2012-09-15 NOTE — Telephone Encounter (Signed)
Message copied by Tonny Bollman on Mon Sep 15, 2012  6:11 PM ------      Message from: Kathryne Sharper T      Created: Mon Sep 15, 2012 11:43 AM       He can go back to Prozac 10 mg. ------

## 2012-09-15 NOTE — Telephone Encounter (Signed)
Patient requesting Prozac to reduce 10 mg.  20 mg is making him more agitated.  We will decrease Prozac to 10 mg daily.

## 2012-09-17 ENCOUNTER — Telehealth: Payer: Self-pay | Admitting: Family Medicine

## 2012-09-17 MED ORDER — FLUOXETINE HCL 10 MG PO CAPS: 10.0000 mg | ORAL_CAPSULE | Freq: Every day | ORAL | Status: DC

## 2012-09-17 NOTE — Telephone Encounter (Signed)
Called patient and he does get supplies from [facility] for home. Will have Dr complete form and fax back

## 2012-09-17 NOTE — Telephone Encounter (Signed)
Pt had left original message on 7/18 stating he did not feel well on increased Prozac 20 mg dose. Attempted to reach pt 3 different times since then - message on phone stated "no minutes to take message" Reached patient today. Instructed he can go back to Prozac 10 mg daily per Dr.Arfeen. Patient states he is out of Prozac 10 mg and needs new RX Rx will be sent to his pharmacy

## 2012-09-22 ENCOUNTER — Encounter (HOSPITAL_COMMUNITY): Payer: Self-pay

## 2012-09-22 ENCOUNTER — Emergency Department (HOSPITAL_COMMUNITY)
Admission: EM | Admit: 2012-09-22 | Discharge: 2012-09-22 | Discharge status: Against Medical Advice | Payer: Medicare Other | Attending: Emergency Medicine | Admitting: Emergency Medicine

## 2012-09-22 ENCOUNTER — Emergency Department (HOSPITAL_COMMUNITY): Payer: Medicare Other

## 2012-09-22 DIAGNOSIS — Z8659 Personal history of other mental and behavioral disorders: Secondary | ICD-10-CM | POA: Insufficient documentation

## 2012-09-22 DIAGNOSIS — F319 Bipolar disorder, unspecified: Secondary | ICD-10-CM | POA: Insufficient documentation

## 2012-09-22 DIAGNOSIS — Z87442 Personal history of urinary calculi: Secondary | ICD-10-CM | POA: Insufficient documentation

## 2012-09-22 DIAGNOSIS — J45901 Unspecified asthma with (acute) exacerbation: Secondary | ICD-10-CM | POA: Insufficient documentation

## 2012-09-22 DIAGNOSIS — E119 Type 2 diabetes mellitus without complications: Secondary | ICD-10-CM | POA: Insufficient documentation

## 2012-09-22 DIAGNOSIS — Z8679 Personal history of other diseases of the circulatory system: Secondary | ICD-10-CM | POA: Insufficient documentation

## 2012-09-22 DIAGNOSIS — Z9089 Acquired absence of other organs: Secondary | ICD-10-CM | POA: Insufficient documentation

## 2012-09-22 DIAGNOSIS — K5900 Constipation, unspecified: Secondary | ICD-10-CM | POA: Insufficient documentation

## 2012-09-22 DIAGNOSIS — Z79899 Other long term (current) drug therapy: Secondary | ICD-10-CM | POA: Insufficient documentation

## 2012-09-22 DIAGNOSIS — Z91040 Latex allergy status: Secondary | ICD-10-CM | POA: Insufficient documentation

## 2012-09-22 DIAGNOSIS — F411 Generalized anxiety disorder: Secondary | ICD-10-CM | POA: Insufficient documentation

## 2012-09-22 DIAGNOSIS — R110 Nausea: Secondary | ICD-10-CM | POA: Insufficient documentation

## 2012-09-22 DIAGNOSIS — K219 Gastro-esophageal reflux disease without esophagitis: Secondary | ICD-10-CM | POA: Insufficient documentation

## 2012-09-22 DIAGNOSIS — M129 Arthropathy, unspecified: Secondary | ICD-10-CM | POA: Insufficient documentation

## 2012-09-22 DIAGNOSIS — Y849 Medical procedure, unspecified as the cause of abnormal reaction of the patient, or of later complication, without mention of misadventure at the time of the procedure: Secondary | ICD-10-CM | POA: Insufficient documentation

## 2012-09-22 DIAGNOSIS — F209 Schizophrenia, unspecified: Secondary | ICD-10-CM | POA: Insufficient documentation

## 2012-09-22 DIAGNOSIS — E669 Obesity, unspecified: Secondary | ICD-10-CM | POA: Insufficient documentation

## 2012-09-22 DIAGNOSIS — Z862 Personal history of diseases of the blood and blood-forming organs and certain disorders involving the immune mechanism: Secondary | ICD-10-CM | POA: Insufficient documentation

## 2012-09-22 DIAGNOSIS — J441 Chronic obstructive pulmonary disease with (acute) exacerbation: Secondary | ICD-10-CM | POA: Insufficient documentation

## 2012-09-22 DIAGNOSIS — R109 Unspecified abdominal pain: Secondary | ICD-10-CM

## 2012-09-22 DIAGNOSIS — Z8719 Personal history of other diseases of the digestive system: Secondary | ICD-10-CM | POA: Insufficient documentation

## 2012-09-22 DIAGNOSIS — I10 Essential (primary) hypertension: Secondary | ICD-10-CM | POA: Insufficient documentation

## 2012-09-22 DIAGNOSIS — E785 Hyperlipidemia, unspecified: Secondary | ICD-10-CM | POA: Insufficient documentation

## 2012-09-22 DIAGNOSIS — Z87448 Personal history of other diseases of urinary system: Secondary | ICD-10-CM | POA: Insufficient documentation

## 2012-09-22 DIAGNOSIS — R1084 Generalized abdominal pain: Secondary | ICD-10-CM | POA: Insufficient documentation

## 2012-09-22 DIAGNOSIS — Z8781 Personal history of (healed) traumatic fracture: Secondary | ICD-10-CM | POA: Insufficient documentation

## 2012-09-22 LAB — CBC WITH DIFFERENTIAL/PLATELET
Basophils Absolute: 0.1 10*3/uL (ref 0.0–0.1)
Basophils Relative: 1 % (ref 0–1)
Eosinophils Absolute: 0.6 10*3/uL (ref 0.0–0.7)
Eosinophils Relative: 6 % — ABNORMAL HIGH (ref 0–5)
HCT: 42.4 % (ref 39.0–52.0)
Hemoglobin: 14.7 g/dL (ref 13.0–17.0)
Lymphocytes Relative: 38 % (ref 12–46)
Lymphs Abs: 3.6 10*3/uL (ref 0.7–4.0)
MCH: 31.9 pg (ref 26.0–34.0)
MCHC: 34.7 g/dL (ref 30.0–36.0)
MCV: 92 fL (ref 78.0–100.0)
Monocytes Absolute: 0.6 10*3/uL (ref 0.1–1.0)
Monocytes Relative: 6 % (ref 3–12)
Neutro Abs: 4.6 10*3/uL (ref 1.7–7.7)
Neutrophils Relative %: 49 % (ref 43–77)
Platelets: 396 10*3/uL (ref 150–400)
RBC: 4.61 MIL/uL (ref 4.22–5.81)
RDW: 13.7 % (ref 11.5–15.5)
WBC: 9.3 10*3/uL (ref 4.0–10.5)

## 2012-09-22 LAB — LIPASE, BLOOD: Lipase: 25 U/L (ref 11–59)

## 2012-09-22 LAB — COMPREHENSIVE METABOLIC PANEL
ALT: 13 U/L (ref 0–53)
AST: 5 U/L (ref 0–37)
Albumin: 4.1 g/dL (ref 3.5–5.2)
Alkaline Phosphatase: 79 U/L (ref 39–117)
BUN: 10 mg/dL (ref 6–23)
CO2: 25 mEq/L (ref 19–32)
Calcium: 9.6 mg/dL (ref 8.4–10.5)
Chloride: 101 mEq/L (ref 96–112)
Creatinine, Ser: 1.29 mg/dL (ref 0.50–1.35)
GFR calc Af Amer: 76 mL/min — ABNORMAL LOW (ref >90)
GFR calc non Af Amer: 66 mL/min — ABNORMAL LOW (ref >90)
Glucose, Bld: 85 mg/dL (ref 70–99)
Potassium: 4.2 mEq/L (ref 3.5–5.1)
Sodium: 136 mEq/L (ref 135–145)
Total Bilirubin: 0.5 mg/dL (ref 0.3–1.2)
Total Protein: 7.5 g/dL (ref 6.0–8.3)

## 2012-09-22 LAB — URINALYSIS, ROUTINE W REFLEX MICROSCOPIC
Bilirubin Urine: NEGATIVE
Glucose, UA: NEGATIVE mg/dL
Hgb urine dipstick: NEGATIVE
Ketones, ur: NEGATIVE mg/dL
Leukocytes, UA: NEGATIVE
Nitrite: NEGATIVE
Protein, ur: NEGATIVE mg/dL
Specific Gravity, Urine: 1.01 (ref 1.005–1.030)
Urobilinogen, UA: 0.2 mg/dL (ref 0.0–1.0)
pH: 6 (ref 5.0–8.0)

## 2012-09-22 MED ORDER — ONDANSETRON HCL 4 MG/2ML IJ SOLN
4.0000 mg | Freq: Once | INTRAMUSCULAR | Status: AC
  Administered 2012-09-22: 4 mg via INTRAVENOUS
  Filled 2012-09-22: qty 2

## 2012-09-22 MED ORDER — IOHEXOL 300 MG/ML  SOLN
100.0000 mL | Freq: Once | INTRAMUSCULAR | Status: AC | PRN
  Administered 2012-09-22: 100 mL via INTRAVENOUS

## 2012-09-22 MED ORDER — SODIUM CHLORIDE 0.9 % IV BOLUS (SEPSIS)
1000.0000 mL | Freq: Once | INTRAVENOUS | Status: AC
  Administered 2012-09-22: 1000 mL via INTRAVENOUS

## 2012-09-22 MED ORDER — IOHEXOL 300 MG/ML  SOLN
50.0000 mL | Freq: Once | INTRAMUSCULAR | Status: AC | PRN
  Administered 2012-09-22: 50 mL via ORAL

## 2012-09-22 MED ORDER — HYDROMORPHONE HCL PF 1 MG/ML IJ SOLN
1.0000 mg | INTRAMUSCULAR | Status: DC | PRN
  Administered 2012-09-22: 1 mg via INTRAVENOUS
  Filled 2012-09-22: qty 1

## 2012-09-22 NOTE — ED Notes (Signed)
Pt had gb removed July 9th, has been having severe ab pain since July 16th, pain is now worse than ever, +nausea, no vomiting, +diarrhea. No fever. Stated that current pain meds are not working for his pain. Has not been to dr.zieglar for follow up

## 2012-09-22 NOTE — ED Notes (Addendum)
Pt c/o LUQ pain at this time, states right flank pain yesterday, + nausea and diarrhea, denies vomiting and fever, pt had recent cholecystectomy on 7/9 by Dr. Leticia Penna

## 2012-09-22 NOTE — ED Notes (Signed)
Informed by radiology staff member that pt had left with family, went in room and pt not in room, IV had been pulled out, catheter intact, EDP made aware of above

## 2012-09-22 NOTE — ED Provider Notes (Signed)
4:42 PM Pt called back to the ED. Says that he had to leave to "take care of some stuff." Prescriptions for cipro/flagyl called to his pharmacy of preference, [address] in [place]. [phone]. Discussed the need to see Dr Leticia Penna tomorrow. Emergent ER return precautions discussed.   Raeford Razor, MD 09/22/12 [phone]
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
## 2012-09-22 NOTE — ED Notes (Signed)
Pt repeatedly stated that he does not want to be admitted and that he would leave AMA if admitted to hospital

## 2012-09-23 ENCOUNTER — Ambulatory Visit (HOSPITAL_COMMUNITY): Payer: Self-pay | Admitting: Psychiatry

## 2012-09-24 NOTE — ED Notes (Signed)
RN spoke with patient's wife concerning calling in antibiotic prescriptions. [name] wife states patient's pcp Dr.Simpson told them there were antibiotic prescriptions in patient's chart. No prescriptions ordered by Dr.Kohut shown in epic. Patient wife instructed to follow up with Dr.Ziegler or call and speak with Dr.Kohut after 3pm tomorrow to clarify. After hanging up with patient's wife, RN spoke with Dr.Pollina to verify-no prescription orders seen in epic. Dr.Pollina reviewed chart and ordered cipro 500mg  po twice a day x 10 days, and flagyl 500mg  three times a day for 10 days. Rn called patient wife back to notify of prescriptions. Prescriptions called in to [facility] in [place], [place].

## 2012-10-02 ENCOUNTER — Telehealth: Payer: Self-pay | Admitting: Family Medicine

## 2012-10-02 ENCOUNTER — Encounter: Payer: Self-pay | Admitting: Family Medicine

## 2012-10-02 DIAGNOSIS — G4734 Idiopathic sleep related nonobstructive alveolar hypoventilation: Secondary | ICD-10-CM | POA: Insufficient documentation

## 2012-10-02 NOTE — Telephone Encounter (Signed)
Pls call opt and let him know that his study showed that his oxygen falls low when he sleeps, so he does qualify for/need oxygen at night when sleeping I have written order script, pls fax to supplier ([facility]?)

## 2012-10-03 ENCOUNTER — Telehealth: Payer: Self-pay | Admitting: Family Medicine

## 2012-10-03 NOTE — Telephone Encounter (Signed)
PT AWARE, FAXED TO [facility]

## 2012-10-03 NOTE — Telephone Encounter (Signed)
Wants to know if he can get pain shots for the pain in his back and also his left shoulder that has been bothering him when he comes in for a nurse visit on the 15th to get a pneumonia vaccine

## 2012-10-06 ENCOUNTER — Telehealth: Payer: Self-pay | Admitting: Family Medicine

## 2012-10-06 NOTE — Telephone Encounter (Signed)
Noted will do at the visit

## 2012-10-06 NOTE — Telephone Encounter (Signed)
Noted  

## 2012-10-06 NOTE — Telephone Encounter (Signed)
Ok to give toradol 60mg  and depo medrol 80mg  Im for pain No pneumonia vaccine before age 46 indicated

## 2012-10-10 ENCOUNTER — Encounter: Payer: Self-pay | Admitting: Family Medicine

## 2012-10-10 ENCOUNTER — Other Ambulatory Visit: Payer: Self-pay

## 2012-10-10 ENCOUNTER — Ambulatory Visit: Payer: Medicare Other | Admitting: Family Medicine

## 2012-10-10 ENCOUNTER — Ambulatory Visit (INDEPENDENT_AMBULATORY_CARE_PROVIDER_SITE_OTHER): Payer: Medicare Other | Admitting: Psychiatry

## 2012-10-10 DIAGNOSIS — F319 Bipolar disorder, unspecified: Secondary | ICD-10-CM

## 2012-10-10 MED ORDER — OXYCODONE-ACETAMINOPHEN 10-325 MG PO TABS: 1.0000 | ORAL_TABLET | Freq: Four times a day (QID) | ORAL | Status: DC | PRN

## 2012-10-10 NOTE — Telephone Encounter (Signed)
Noted  

## 2012-10-13 ENCOUNTER — Ambulatory Visit (INDEPENDENT_AMBULATORY_CARE_PROVIDER_SITE_OTHER): Payer: Medicare Other | Admitting: Family Medicine

## 2012-10-13 ENCOUNTER — Encounter: Payer: Self-pay | Admitting: Family Medicine

## 2012-10-13 VITALS — BP 118/80 | HR 91 | Resp 16 | Wt 304.1 lb

## 2012-10-13 DIAGNOSIS — F319 Bipolar disorder, unspecified: Secondary | ICD-10-CM

## 2012-10-13 DIAGNOSIS — L259 Unspecified contact dermatitis, unspecified cause: Secondary | ICD-10-CM

## 2012-10-13 DIAGNOSIS — F17200 Nicotine dependence, unspecified, uncomplicated: Secondary | ICD-10-CM

## 2012-10-13 DIAGNOSIS — M24549 Contracture, unspecified hand: Secondary | ICD-10-CM

## 2012-10-13 DIAGNOSIS — M24542 Contracture, left hand: Secondary | ICD-10-CM

## 2012-10-13 DIAGNOSIS — M549 Dorsalgia, unspecified: Secondary | ICD-10-CM

## 2012-10-13 DIAGNOSIS — J449 Chronic obstructive pulmonary disease, unspecified: Secondary | ICD-10-CM

## 2012-10-13 DIAGNOSIS — G4734 Idiopathic sleep related nonobstructive alveolar hypoventilation: Secondary | ICD-10-CM

## 2012-10-13 DIAGNOSIS — L309 Dermatitis, unspecified: Secondary | ICD-10-CM | POA: Insufficient documentation

## 2012-10-13 DIAGNOSIS — R0902 Hypoxemia: Secondary | ICD-10-CM

## 2012-10-13 DIAGNOSIS — J4489 Other specified chronic obstructive pulmonary disease: Secondary | ICD-10-CM

## 2012-10-13 DIAGNOSIS — R7301 Impaired fasting glucose: Secondary | ICD-10-CM

## 2012-10-13 DIAGNOSIS — I10 Essential (primary) hypertension: Secondary | ICD-10-CM

## 2012-10-13 DIAGNOSIS — E785 Hyperlipidemia, unspecified: Secondary | ICD-10-CM

## 2012-10-13 MED ORDER — KETOROLAC TROMETHAMINE 60 MG/2ML IM SOLN
60.0000 mg | Freq: Once | INTRAMUSCULAR | Status: AC
  Administered 2012-10-13: 60 mg via INTRAMUSCULAR

## 2012-10-13 MED ORDER — BETAMETHASONE VALERATE 0.1 % EX OINT: TOPICAL_OINTMENT | Freq: Two times a day (BID) | CUTANEOUS | Status: DC

## 2012-10-13 MED ORDER — METHYLPREDNISOLONE ACETATE 80 MG/ML IJ SUSP
80.0000 mg | Freq: Once | INTRAMUSCULAR | Status: AC
  Administered 2012-10-13: 80 mg via INTRAMUSCULAR

## 2012-10-13 MED ORDER — PREDNISONE 5 MG PO TABS: 5.0000 mg | ORAL_TABLET | Freq: Two times a day (BID) | ORAL | Status: AC

## 2012-10-13 NOTE — Progress Notes (Signed)
  Subjective:    Patient ID: Gavin Gray, male    DOB: Oct 14, 1965, 47 y.o.   MRN: [id number]  HPI The PT is here for follow up and re-evaluation of chronic medical conditions, medication management and review of any available recent lab and radiology data.  Preventive health is updated, specifically  Cancer screening and Immunization.   Questions or concerns regarding consultations or procedures which the PT has had in the interim are  addressed. The PT denies any adverse reactions to current medications since the last visit.  There are no new concerns.  Increased back and left arm pain since most recent fall off the commode last week, requests injections  C/o pruritic rash on right buttock since cholecystectomy, states he had the complication of post op infection, so he had as rough time, but is now over the worst , appetite has rebounded, unfortunately with weight gain since he is no longer as nauseated, understand and agrees with the need to cut back on intake to continue the weight loss which he started. Face to face visit also required to establish his need for nocturnal oxygen. Sleep study clearly demonstrates desaturation below 88% during his sleep for significant periods of time. He has an over 20 pack year smoking history, as well as being morbidly obese.He is recommitting to work both on smoking cessation and weight loss, both of which he tends to have a yo yo experience with     Review of Systems See HPI Denies recent fever or chills. Denies sinus pressure, nasal congestion, ear pain or sore throat. Denies chest congestion, productive cough or wheezing. Denies chest pains, palpitations and leg swelling Denies abdominal pain, nausea, vomiting,diarrhea or constipation.   Denies dysuria, frequency, hesitancy or incontinence.  Denies headaches, seizures, numbness, or tingling. Denies uncontrolled  depression, anxiety or insomnia.        Objective:   Physical  Exam  Patient alert and oriented and in no cardiopulmonary distress.  HEENT: No facial asymmetry, EOMI, no sinus tenderness,  oropharynx pink and moist.  Neck supple no adenopathy.  Chest: Clear to auscultation bilaterally.Decreased air entry throughout , though adequate  CVS: S1, S2 no murmurs, no S3.  ABD: Soft non tender.  Skin:eryhtematous maculo papular rash on right buttock, no drainage or warmth Ext: No edema  MS: Decreased ROM  Spine , shoulders and knees  Psych: Good eye contact, normal affect. Memory intact not anxious or depressed appearing.  CNS: CN 2-12 intact, power, normal throughout.       Assessment & Plan:

## 2012-10-13 NOTE — Patient Instructions (Addendum)
Annual wellness in mid November.  Toradol 60mg  IM and depo medrol 80mg  Im today for pain  Prednisone for 5 days, and steroid cream to rash twice daily for rash   HBA1C and lipids today   Please cut back on amount of food you eat so you continue to lose weight and commit to regular daily physical activity  Start 15 ciggs per day for 1 week , then 14 ciggs per day for next week , then 13 ciggs per day etc till you quit.  Yiou do qualify for oxygen while sleeping, so NO SMOKING AFTER 7 :30 in the evening  Pain meds today

## 2012-10-15 ENCOUNTER — Telehealth: Payer: Self-pay | Admitting: Family Medicine
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
## 2012-10-15 NOTE — Patient Instructions (Signed)
Discussed orally 

## 2012-10-15 NOTE — Assessment & Plan Note (Signed)
Pt has longstanding h/o nicotine use. He wants to and has been trying to quit but often has rel;apses. He continues to struggle with this, however, the longer he continues to smoke the more deterioration there is in his lung function, he understands this and is concerned. Has noted increased SOB, and a recent overnight study done on 10/01/2012 Shows that he does have nocturnal hypoxia warranting the need for supplemental oxygen while asleep. He understands the danger of oxgen and cigarette use, and will now plan to stop smoking by dusk/7pm

## 2012-10-17 ENCOUNTER — Telehealth: Payer: Self-pay | Admitting: Family Medicine

## 2012-10-17 NOTE — Telephone Encounter (Signed)
Spoke with Leta Jungling from [facility] and faxed in requtested information

## 2012-10-17 NOTE — Telephone Encounter (Signed)
Information sent to [facility] to be forwarded to [facility]

## 2012-10-18 ENCOUNTER — Other Ambulatory Visit (HOSPITAL_COMMUNITY): Payer: Self-pay | Admitting: Psychiatry

## 2012-10-19 NOTE — Assessment & Plan Note (Signed)
Recent overnight pulse oximetry clearly demonstrates the need for supplemental oxygen, pt advised to limit smoking to daylight hours only and reminded of the need to quit

## 2012-10-19 NOTE — Assessment & Plan Note (Signed)
Increased and uncontrolled follpowing recent fall, toradol and depo medrol in office

## 2012-10-19 NOTE — Assessment & Plan Note (Signed)
Acute eczema , topical steroid to be applied twice daily , till cleared

## 2012-10-19 NOTE — Assessment & Plan Note (Signed)
deteriortaed Patient counseled for approximately 5 minutes regarding the health risks of ongoing nicotine use, specifically all types of cancer, heart disease, stroke and respiratory failure. The options available for help with cessation ,the behavioral changes to assist the process, and the option to either gradully reduce usage  Or abruptly stop.is also discussed. Pt is also encouraged to set specific goals in number of cigarettes used daily, as well as to set a quit date.

## 2012-10-19 NOTE — Assessment & Plan Note (Signed)
Maintained on chronic pain medication 

## 2012-10-19 NOTE — Assessment & Plan Note (Signed)
Stable, treated by psychiatry 

## 2012-10-19 NOTE — Assessment & Plan Note (Signed)
Controlled, no change in medication DASH diet and commitment to daily physical activity for a minimum of 30 minutes discussed and encouraged, as a part of hypertension management. The importance of attaining a healthy weight is also discussed.  

## 2012-10-20 ENCOUNTER — Other Ambulatory Visit (HOSPITAL_COMMUNITY): Payer: Self-pay | Admitting: Psychiatry

## 2012-10-20 NOTE — Telephone Encounter (Signed)
Given on 09/17/12 with one refill. Should last him for 11/18/12.

## 2012-10-22 ENCOUNTER — Encounter: Payer: Self-pay | Admitting: Family Medicine

## 2012-10-24 ENCOUNTER — Other Ambulatory Visit: Payer: Self-pay

## 2012-10-24 MED ORDER — RIZATRIPTAN BENZOATE 10 MG PO TBDP: 10.0000 mg | ORAL_TABLET | ORAL | Status: DC | PRN

## 2012-10-31 ENCOUNTER — Ambulatory Visit (HOSPITAL_COMMUNITY): Payer: Self-pay | Admitting: Psychiatry

## 2012-11-03 ENCOUNTER — Ambulatory Visit (INDEPENDENT_AMBULATORY_CARE_PROVIDER_SITE_OTHER): Payer: Medicare Other | Admitting: Psychiatry

## 2012-11-03 ENCOUNTER — Encounter (HOSPITAL_COMMUNITY): Payer: Self-pay | Admitting: Psychiatry

## 2012-11-03 VITALS — BP 107/81 | HR 72 | Ht 71.0 in | Wt 294.4 lb

## 2012-11-03 DIAGNOSIS — F329 Major depressive disorder, single episode, unspecified: Secondary | ICD-10-CM

## 2012-11-03 DIAGNOSIS — F29 Unspecified psychosis not due to a substance or known physiological condition: Secondary | ICD-10-CM

## 2012-11-03 DIAGNOSIS — F319 Bipolar disorder, unspecified: Secondary | ICD-10-CM

## 2012-11-03 DIAGNOSIS — F1011 Alcohol abuse, in remission: Secondary | ICD-10-CM

## 2012-11-03 MED ORDER — FLUOXETINE HCL 10 MG PO CAPS
10.0000 mg | ORAL_CAPSULE | Freq: Every day | ORAL | Status: DC
Start: 2012-11-03 — End: 2013-01-05

## 2012-11-03 MED ORDER — HALOPERIDOL 10 MG PO TABS: ORAL_TABLET | ORAL | Status: DC

## 2012-11-03 MED ORDER — BENZTROPINE MESYLATE 2 MG PO TABS: 2.0000 mg | ORAL_TABLET | Freq: Every day | ORAL | Status: DC

## 2012-11-03 MED ORDER — ALPRAZOLAM 1 MG PO TABS: 1.0000 mg | ORAL_TABLET | Freq: Three times a day (TID) | ORAL | Status: DC | PRN
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
## 2012-11-04 ENCOUNTER — Ambulatory Visit (INDEPENDENT_AMBULATORY_CARE_PROVIDER_SITE_OTHER): Payer: Medicare Other | Admitting: Psychiatry

## 2012-11-04 DIAGNOSIS — F319 Bipolar disorder, unspecified: Secondary | ICD-10-CM
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
## 2012-11-04 NOTE — Patient Instructions (Signed)
Discussed orally 

## 2012-11-07 ENCOUNTER — Other Ambulatory Visit: Payer: Self-pay

## 2012-11-07 MED ORDER — OXYCODONE-ACETAMINOPHEN 10-325 MG PO TABS: 1.0000 | ORAL_TABLET | Freq: Four times a day (QID) | ORAL | Status: DC | PRN

## 2012-11-18 ENCOUNTER — Ambulatory Visit (INDEPENDENT_AMBULATORY_CARE_PROVIDER_SITE_OTHER): Payer: Medicare Other | Admitting: Psychiatry

## 2012-11-18 DIAGNOSIS — F319 Bipolar disorder, unspecified: Secondary | ICD-10-CM

## 2012-11-21 NOTE — Progress Notes (Signed)
Patient:  Gavin Gray   DOB: 1965/10/26  MR Number: [id number]  Location: Behavioral Health Center:  [address] [place],  [place], [address]  Start: Tuesday 11/18/2012 2:00 PM End: Tuesday 11/18/2012 2:50 PM  Provider/Observer:     Florencia Reasons, MSW, LCSW   Chief Complaint:      Chief Complaint  Patient presents with  . Anxiety  . Depression    Reason For Service:     The patient initially was referred for services upon his discharge from the [facility] where he was treated for depression and suicidal ideations. Patient has a long-standing history of recurrent periods of depression and anxiety. Patient is seen for a follow up appointment today.  Interventions Strategy:  Supportive therapy, cognitive behavioral therapy  Participation Level:   Active  Participation Quality:  Appropriate      Behavioral Observation:  Fairly Groomed, casual, alert, angry  Current Psychosocial Factors: The patient reports continued conflict with biological family.  Content of Session:   Reviewing symptoms, processing feelings, reinforcing patient's efforts to set and maintain boundaries in the relationship with his birth family, reviewing relaxation techniques  Current Status:   Patient reports continued improved mood and decreased anxiety but continued  anger. Patient denies suicidal ideations and homicidal ideations. He reports increased hallucinations but denies any command hallucinations  Patient Progress:     Patient reports continuing to feel much better since taking Prozac as prescribed by Dr. Lolly Mustache. However, he continues to express frustration, anger, and resentment regarding the relationship with his biological family. Therapist works with patient to process his feelings. He is feeling hurt and rejected by his parents as he no longer is allowed in their home. 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 Patient has reconciled with both of his children and reports having a positive relationship. He is having regular contact with his grandchildren and has continued strong emotional support from his ex-wife. He does express sadness about the upcoming anniversary of the death of his child that died in infancy.   Impression/Diagnosis:   The patient has a long-standing history of recurrent periods of depression along with anxiety and panic attacks. He also has a history of explosive anger outburst and mood swings Diagnoses: Bipolar  disorder, anxiety disorder NOS  Diagnosis:  Axis I:  Bipolar 1 disorder          Axis II: Deferred

## 2012-11-21 NOTE — Patient Instructions (Signed)
Discussed orally 

## 2012-11-24 ENCOUNTER — Other Ambulatory Visit: Payer: Self-pay | Admitting: Family Medicine

## 2012-11-24 ENCOUNTER — Ambulatory Visit: Payer: Medicare Other | Admitting: Family Medicine

## 2012-11-28 ENCOUNTER — Other Ambulatory Visit: Payer: Self-pay

## 2012-11-28 DIAGNOSIS — K219 Gastro-esophageal reflux disease without esophagitis: Secondary | ICD-10-CM

## 2012-11-28 MED ORDER — ESOMEPRAZOLE MAGNESIUM 40 MG PO CPDR: 40.0000 mg | DELAYED_RELEASE_CAPSULE | Freq: Every day | ORAL | Status: DC

## 2012-12-01 ENCOUNTER — Telehealth: Payer: Self-pay | Admitting: Family Medicine

## 2012-12-01 ENCOUNTER — Other Ambulatory Visit: Payer: Self-pay

## 2012-12-01 MED ORDER — GABAPENTIN 400 MG PO CAPS: 1200.0000 mg | ORAL_CAPSULE | Freq: Two times a day (BID) | ORAL | Status: DC

## 2012-12-04 ENCOUNTER — Other Ambulatory Visit: Payer: Self-pay

## 2012-12-04 MED ORDER — OXYCODONE-ACETAMINOPHEN 10-325 MG PO TABS: 1.0000 | ORAL_TABLET | Freq: Four times a day (QID) | ORAL | Status: DC | PRN

## 2012-12-05 NOTE — Telephone Encounter (Signed)
Nurse sent over medicine

## 2012-12-09 ENCOUNTER — Ambulatory Visit (INDEPENDENT_AMBULATORY_CARE_PROVIDER_SITE_OTHER): Payer: Medicare Other | Admitting: Psychiatry

## 2012-12-09 DIAGNOSIS — F319 Bipolar disorder, unspecified: Secondary | ICD-10-CM

## 2012-12-09 NOTE — Patient Instructions (Signed)
Discussed orally 
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
## 2012-12-10 LAB — LIPID PANEL
HDL: 30 mg/dL — ABNORMAL LOW (ref >39)
LDL Cholesterol: 126 mg/dL — ABNORMAL HIGH (ref 0–99)
Total CHOL/HDL Ratio: 6.7 Ratio
Triglycerides: 224 mg/dL — ABNORMAL HIGH (ref <150)

## 2012-12-10 LAB — HEMOGLOBIN A1C
Hgb A1c MFr Bld: 6.1 % — ABNORMAL HIGH (ref <5.7)
Mean Plasma Glucose: 128 mg/dL — ABNORMAL HIGH (ref <117)

## 2012-12-23 ENCOUNTER — Telehealth: Payer: Self-pay | Admitting: Family Medicine

## 2012-12-23 NOTE — Telephone Encounter (Signed)
Needs his diabetic supplies sent to [facility] the number is [phone] also needs a lift chair from his hernia so big and getting worse he can not get up and down from couch wants it from [facility] in [place] the number there is [phone] also needs a hernia belt size XL

## 2012-12-25 NOTE — Telephone Encounter (Signed)
Spoke with patient and he is aware that he does not qualify for diabetic supplies as he has no diagnosis of this.  We are also unable to provide a prescription for hernia supplies.  Advised to contact surgeon.

## 2012-12-25 NOTE — Telephone Encounter (Signed)
Called and left message for patient to return call.  

## 2012-12-31 ENCOUNTER — Encounter (INDEPENDENT_AMBULATORY_CARE_PROVIDER_SITE_OTHER): Payer: Self-pay

## 2012-12-31 ENCOUNTER — Encounter: Payer: Self-pay | Admitting: Family Medicine

## 2012-12-31 ENCOUNTER — Ambulatory Visit (INDEPENDENT_AMBULATORY_CARE_PROVIDER_SITE_OTHER): Payer: Medicare Other | Admitting: Family Medicine

## 2012-12-31 VITALS — BP 114/78 | HR 63 | Resp 16 | Ht 73.0 in | Wt 289.8 lb

## 2012-12-31 DIAGNOSIS — E669 Obesity, unspecified: Secondary | ICD-10-CM

## 2012-12-31 DIAGNOSIS — Z139 Encounter for screening, unspecified: Secondary | ICD-10-CM

## 2012-12-31 DIAGNOSIS — R7301 Impaired fasting glucose: Secondary | ICD-10-CM

## 2012-12-31 DIAGNOSIS — Z0000 Encounter for general adult medical examination without abnormal findings: Secondary | ICD-10-CM

## 2012-12-31 DIAGNOSIS — E785 Hyperlipidemia, unspecified: Secondary | ICD-10-CM

## 2012-12-31 DIAGNOSIS — Z23 Encounter for immunization: Secondary | ICD-10-CM

## 2012-12-31 DIAGNOSIS — Z125 Encounter for screening for malignant neoplasm of prostate: Secondary | ICD-10-CM

## 2012-12-31 DIAGNOSIS — I10 Essential (primary) hypertension: Secondary | ICD-10-CM

## 2012-12-31 MED ORDER — AMLODIPINE BESY-BENAZEPRIL HCL 5-20 MG PO CAPS: 1.0000 | ORAL_CAPSULE | Freq: Every day | ORAL | Status: DC

## 2012-12-31 MED ORDER — PROPRANOLOL HCL 20 MG PO TABS: ORAL_TABLET | ORAL | Status: DC

## 2012-12-31 NOTE — Patient Instructions (Addendum)
F/u in 4 month,with rectal, call if you need me before  Flu vaccine today   Cut back on sugar, fried and fatty foods and donuts, your sugar , and cholesterol have gone up   Please cut back on cigarettes   Reduced dose off lotrel starting tomorrow, we will give you the script to take to the pharmacy if you prefer, call if there is a problem, blood pressure is too low  And you get light headed  Fasting lipid, cmp and HBA1C , vit D and PSA in 4 month, before next visit

## 2012-12-31 NOTE — Progress Notes (Signed)
Subjective:    Patient ID: Gavin Gray, male    DOB: Sep 14, 1965, 47 y.o.   MRN: [id number]  HPI  Preventive Screening-Counseling & Management   Patient present here today for a Medicare annual wellness visit.   Current Problems (verified)   Medications Prior to Visit Allergies (verified)   PAST HISTORY  Family History  Social History    Risk Factors  Current exercise habits:  As able moves around for at least 30 minutes daily  Dietary issues discussed:low fat diet rich in vegetable and fruit   Cardiac risk factors:   Depression Screen  (Note: if answer to either of the following is "Yes", a more complete depression screening is indicated)  stressed over breakdown in relationship with his parents, recently "reported" his sister from medication mismanagement of her dependent mother reportedly, since then not able to visit parents and be involved with them as before. His ex wife who he has become engaged to again, as well as his children and grand children keep him going. Not suicidal or homicidal and wants to live Over the past two weeks, have you felt down, depressed or hopeless? No  Over the past two weeks, have you felt little interest or pleasure in doing things? No  Have you lost interest or pleasure in daily life? No  Do you often feel hopeless? No  Do you cry easily over simple problems? No   Activities of Daily Living  In your present state of health, do you have any difficulty performing the following activities?  Driving?: No, but no night driving Managing money?: yes , partner takes care of bill payment Feeding yourself?:No Getting from bed to chair?:No Climbing a flight of stairs?:yes, due to right knee swelling and pain Preparing food and eating?:No Bathing or showering?:yes , gets help, espescialy with washing his back Getting dressed?:No Getting to the toilet?:No Using the toilet?:No Moving around from place to place?: yes due to radiating back  pain and right knee pain, and shortness of breath  Fall Risk Assessment In the past year have you fallen or had a near fall?:yes, fell 1 week ago Are you currently taking any medications that make you dizzy?:yes, thinks with lotrel and weight loss he is having light headedness   Hearing Difficulties: No Do you often ask people to speak up or repeat themselves?:No Do you experience ringing or noises in your ears?:No Do you have difficulty understanding soft or whispered voices?:No  Cognitive Testing  Alert? Yes Normal Appearance?Yes  Oriented to person? Yes Place? Yes  Time? Yes  Displays appropriate judgment?Yes  Can read the correct time from a watch face? yes Are you having problems remembering things?No  Advanced Directives have been discussed with the patient?Yes , full code, this is a change from 2013 visit   List the Names of Other Physician/Practitioners you currently use:    Indicate any recent Medical Services you may have received from other than [facility] providers in the past year (date may be approximate).   Assessment:    Annual Wellness Exam   Plan:    During the course of the visit the patient was educated and counseled about appropriate screening and preventive services including:  A healthy diet is rich in fruit, vegetables and whole grains. Poultry fish, nuts and beans are a healthy choice for protein rather then red meat. A low sodium diet and drinking 64 ounces of water daily is generally recommended. Oils and sweet should be limited. Carbohydrates especially for those who  are diabetic or overweight, should be limited to 30-45 gram per meal. It is important to eat on a regular schedule, at least 3 times daily. Snacks should be primarily fruits, vegetables or nuts. It is important that you exercise regularly at least 30 minutes 5 times a week. If you develop chest pain, have severe difficulty breathing, or feel very tired, stop exercising immediately and seek  medical attention  Immunization reviewed and updated. Cancer screening reviewed and updated    Patient Instructions (the written plan) was given to the patient.  Medicare Attestation  I have personally reviewed:  The patient's medical and social history  Their use of alcohol, tobacco or illicit drugs  Their current medications and supplements  The patient's functional ability including ADLs,fall risks, home safety risks, cognitive, and hearing and visual impairment  Diet and physical activities  Evidence for depression or mood disorders  The patient's weight, height, BMI, and visual acuity have been recorded in the chart. I have made referrals, counseling, and provided education to the patient based on review of the above and I have provided the patient with a written personalized care plan for preventive services.     Review of Systems     Objective:   Physical Exam        Assessment & Plan:

## 2013-01-01 ENCOUNTER — Other Ambulatory Visit (HOSPITAL_COMMUNITY): Payer: Self-pay | Admitting: Psychiatry

## 2013-01-01 DIAGNOSIS — F319 Bipolar disorder, unspecified: Secondary | ICD-10-CM

## 2013-01-02 ENCOUNTER — Other Ambulatory Visit: Payer: Self-pay

## 2013-01-02 DIAGNOSIS — Z0000 Encounter for general adult medical examination without abnormal findings: Secondary | ICD-10-CM | POA: Insufficient documentation

## 2013-01-02 MED ORDER — OXYCODONE-ACETAMINOPHEN 10-325 MG PO TABS: 1.0000 | ORAL_TABLET | Freq: Four times a day (QID) | ORAL | Status: DC | PRN

## 2013-01-02 NOTE — Assessment & Plan Note (Signed)
Over corrected Medication dose is lowered as symptomatic

## 2013-01-02 NOTE — Assessment & Plan Note (Signed)
Annual wellness as documented. Pt limited in mobility and suffers from schizophrenia and depression, both of which are controlled. His immediate family is supportive, however, recent breakdown in parents and sibling has him under increased stress. Advised reduction in nicotine use, this has increased again  Has changed to a full code status this year, which is good

## 2013-01-05 ENCOUNTER — Ambulatory Visit (INDEPENDENT_AMBULATORY_CARE_PROVIDER_SITE_OTHER): Payer: Medicare Other | Admitting: Psychiatry

## 2013-01-05 ENCOUNTER — Encounter (HOSPITAL_COMMUNITY): Payer: Self-pay | Admitting: Psychiatry

## 2013-01-05 VITALS — BP 140/78 | HR 80 | Wt 294.0 lb

## 2013-01-05 DIAGNOSIS — F1011 Alcohol abuse, in remission: Secondary | ICD-10-CM

## 2013-01-05 DIAGNOSIS — F323 Major depressive disorder, single episode, severe with psychotic features: Secondary | ICD-10-CM

## 2013-01-05 DIAGNOSIS — F319 Bipolar disorder, unspecified: Secondary | ICD-10-CM

## 2013-01-05 MED ORDER — BENZTROPINE MESYLATE 2 MG PO TABS: ORAL_TABLET | ORAL | Status: DC

## 2013-01-05 MED ORDER — ALPRAZOLAM 1 MG PO TABS: 1.0000 mg | ORAL_TABLET | Freq: Three times a day (TID) | ORAL | Status: DC | PRN

## 2013-01-05 MED ORDER — FLUOXETINE HCL 10 MG PO CAPS: 10.0000 mg | ORAL_CAPSULE | Freq: Every day | ORAL | Status: DC

## 2013-01-05 MED ORDER — HALOPERIDOL 10 MG PO TABS: ORAL_TABLET | ORAL | Status: DC
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
## 2013-01-06 ENCOUNTER — Ambulatory Visit (INDEPENDENT_AMBULATORY_CARE_PROVIDER_SITE_OTHER): Payer: Medicare Other | Admitting: Psychiatry

## 2013-01-06 DIAGNOSIS — F319 Bipolar disorder, unspecified: Secondary | ICD-10-CM

## 2013-01-06 NOTE — Patient Instructions (Signed)
Discussed orally 

## 2013-01-06 NOTE — Progress Notes (Signed)
Patient:  Gavin Gray   DOB: [birth year]  MR Number: [id number]  Location: [facility] Center:  [address] [place],  [place], [address]  Start: Tuesday 01/06/2013 1:00 PM End: Tuesday 01/06/2013 1:45 PM  Provider/Observer:     Florencia Reasons, MSW, LCSW   Chief Complaint:      Chief Complaint  Patient presents with  . Anxiety    Reason For Service:     The patient initially was referred for services [facility] for depression and suicidal ideations. Patient has a long-standing history of recurrent periods of depression and anxiety. Patient is seen for a follow up appointment today.  Interventions Strategy:  Supportive therapy, cognitive behavioral therapy  Participation Level:   Active  Participation Quality:  Appropriate      Behavioral Observation:  Fairly Groomed, casual, alert,   Current Psychosocial Factors: The patient reports no contact with parents and siblings  Content of Session:   Reviewing symptoms, processing feelings, reinforcing patient's efforts to maintain boundaries in the relationship with his birth family and his children, discussing patient's spirituality and effects on patient's behavior and mood  Current Status:   Patient reports continued improved mood and decreased anxiety. He reports no suicidal ideations and no hallucinations.  Patient Progress:     Patient reports continuing to feel much better since taking Prozac as prescribed by Dr. Lolly Mustache. He recently reconciled with his father and has been able to reconnect with 2 of his sisters. Patient reports increased focus on spirituality and forgiveness. He states feeling much better since letting things go. He reports increased involvement in activity including working on his girlfriend's car, playing and listening to music. He reports he and his girlfriend have been going out more including going to a movie, going to the mall, and going to  dinner. He has given girlfriend an engagement ring but expresses fear of getting married. Patient states being committed to the relationship buf being fearful that girlfriend may leave as she has in the past. Therapist works with patient to process his feelings. He reports some of his thoughts are being influenced by his daughter who does not want him to marry her mother as daughter wants to inherit patient's valuable possessions per his report. Patient states that he knows his girlfriend has shown her commitment as she has remained with him through very difficult times in the past 3 years. He plans to have conversation about marriage with girlfriend.   Impression/Diagnosis:   The patient has a long-standing history of recurrent periods of depression along with anxiety and panic attacks. He also has a history of explosive anger outburst and mood swings Diagnoses: Bipolar  disorder, anxiety disorder NOS  Diagnosis:  Axis I:  Bipolar 1 disorder          Axis II: Deferred

## 2013-01-08 ENCOUNTER — Other Ambulatory Visit: Payer: Self-pay | Admitting: Family Medicine

## 2013-01-13 ENCOUNTER — Other Ambulatory Visit: Payer: Self-pay

## 2013-01-13 MED ORDER — RIZATRIPTAN BENZOATE 10 MG PO TBDP: 10.0000 mg | ORAL_TABLET | ORAL | Status: DC | PRN

## 2013-01-16 ENCOUNTER — Other Ambulatory Visit: Payer: Self-pay | Admitting: Family Medicine

## 2013-01-26 ENCOUNTER — Other Ambulatory Visit: Payer: Self-pay

## 2013-01-26 MED ORDER — FLUTICASONE PROPIONATE 50 MCG/ACT NA SUSP: 1.0000 | Freq: Every day | NASAL | Status: DC

## 2013-01-29 ENCOUNTER — Other Ambulatory Visit: Payer: Self-pay | Admitting: Family Medicine

## 2013-01-30 ENCOUNTER — Other Ambulatory Visit: Payer: Self-pay

## 2013-01-30 MED ORDER — OXYCODONE-ACETAMINOPHEN 10-325 MG PO TABS: 1.0000 | ORAL_TABLET | Freq: Four times a day (QID) | ORAL | Status: DC | PRN

## 2013-02-03 ENCOUNTER — Ambulatory Visit (HOSPITAL_COMMUNITY): Payer: Self-pay | Admitting: Psychiatry

## 2013-02-09 ENCOUNTER — Other Ambulatory Visit (HOSPITAL_COMMUNITY): Payer: Self-pay | Admitting: Psychiatry

## 2013-02-09 NOTE — Telephone Encounter (Signed)
Chart reviewed, refill not approved. Was filled 01/05/13 with 1 refill available.

## 2013-02-27 ENCOUNTER — Other Ambulatory Visit: Payer: Self-pay

## 2013-02-27 MED ORDER — OXYCODONE-ACETAMINOPHEN 10-325 MG PO TABS: 1.0000 | ORAL_TABLET | Freq: Four times a day (QID) | ORAL | Status: DC | PRN

## 2013-03-02 ENCOUNTER — Encounter (HOSPITAL_COMMUNITY): Payer: Self-pay | Admitting: Psychiatry

## 2013-03-02 ENCOUNTER — Ambulatory Visit (INDEPENDENT_AMBULATORY_CARE_PROVIDER_SITE_OTHER): Payer: 59 | Admitting: Psychiatry

## 2013-03-02 ENCOUNTER — Telehealth: Payer: Self-pay | Admitting: Family Medicine

## 2013-03-02 VITALS — BP 140/93 | HR 60 | Ht 71.1 in | Wt 284.6 lb

## 2013-03-02 DIAGNOSIS — F329 Major depressive disorder, single episode, unspecified: Secondary | ICD-10-CM

## 2013-03-02 DIAGNOSIS — F319 Bipolar disorder, unspecified: Secondary | ICD-10-CM

## 2013-03-02 DIAGNOSIS — F29 Unspecified psychosis not due to a substance or known physiological condition: Secondary | ICD-10-CM

## 2013-03-02 DIAGNOSIS — F1011 Alcohol abuse, in remission: Secondary | ICD-10-CM

## 2013-03-02 MED ORDER — FLUOXETINE HCL 10 MG PO CAPS: 10.0000 mg | ORAL_CAPSULE | Freq: Every day | ORAL | Status: DC

## 2013-03-02 MED ORDER — ALPRAZOLAM 1 MG PO TABS: 1.0000 mg | ORAL_TABLET | Freq: Three times a day (TID) | ORAL | Status: DC | PRN

## 2013-03-02 MED ORDER — HALOPERIDOL 10 MG PO TABS: ORAL_TABLET | ORAL | Status: DC

## 2013-03-02 MED ORDER — BENZTROPINE MESYLATE 2 MG PO TABS: ORAL_TABLET | ORAL | Status: DC

## 2013-03-02 NOTE — Telephone Encounter (Signed)
Called patient and left message to call back.
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
## 2013-03-03 ENCOUNTER — Other Ambulatory Visit: Payer: Self-pay

## 2013-03-03 MED ORDER — PROPRANOLOL HCL 20 MG PO TABS: ORAL_TABLET | ORAL | Status: DC

## 2013-03-03 NOTE — Telephone Encounter (Signed)
I reviewed Dr Darletta Moll note, blood pressure was not as high as he stated, and Dr Adele Schilder did mention possibly increasing the inderal to 5 times daily, now taking 4 times daily

## 2013-03-03 NOTE — Telephone Encounter (Signed)
Patient aware. Propanolol increased and sent and appt made for 4 weeks

## 2013-03-03 NOTE — Telephone Encounter (Signed)
I have reviewed record, BP was 140/93. OK for him to inc the propranolol to one tablet 5 times daily, he is now taking it 4 times daily (20 47) , so take every 4 .5 hours approx on a schedule.  Needs visit in 4 weeks with EKG done at that time to eval the BP

## 2013-03-03 NOTE — Telephone Encounter (Signed)
States he went to Dr Adele Schilder and his BP was 165/102 (per pt) and he told the dr that you had decreased his Lotrel to 5/20 and he said he needed to go back on the lotrel 10/40. And his propanolol was also decreased to 4 tabs daily from 5 and Dr Adele Schilder said he was on that for mood stabilization and BP and needed to be back on it 5x per day. Please advise if you approve of these changes.

## 2013-03-13 ENCOUNTER — Other Ambulatory Visit: Payer: Self-pay

## 2013-03-13 MED ORDER — RANITIDINE HCL 150 MG PO TABS: 150.0000 mg | ORAL_TABLET | Freq: Two times a day (BID) | ORAL | Status: DC

## 2013-03-13 MED ORDER — DICLOFENAC SODIUM 75 MG PO TBEC: 75.0000 mg | DELAYED_RELEASE_TABLET | Freq: Two times a day (BID) | ORAL | Status: DC

## 2013-03-20 ENCOUNTER — Other Ambulatory Visit: Payer: Self-pay | Admitting: Family Medicine

## 2013-03-27 ENCOUNTER — Other Ambulatory Visit: Payer: Self-pay

## 2013-03-27 MED ORDER — OXYCODONE-ACETAMINOPHEN 10-325 MG PO TABS: 1.0000 | ORAL_TABLET | Freq: Four times a day (QID) | ORAL | Status: DC | PRN

## 2013-03-31 ENCOUNTER — Other Ambulatory Visit: Payer: Self-pay

## 2013-03-31 DIAGNOSIS — K219 Gastro-esophageal reflux disease without esophagitis: Secondary | ICD-10-CM

## 2013-03-31 MED ORDER — ESOMEPRAZOLE MAGNESIUM 40 MG PO CPDR: 40.0000 mg | DELAYED_RELEASE_CAPSULE | Freq: Every day | ORAL | Status: DC

## 2013-04-06 ENCOUNTER — Encounter (INDEPENDENT_AMBULATORY_CARE_PROVIDER_SITE_OTHER): Payer: Self-pay

## 2013-04-06 ENCOUNTER — Ambulatory Visit (INDEPENDENT_AMBULATORY_CARE_PROVIDER_SITE_OTHER): Payer: Medicare Other | Admitting: Family Medicine

## 2013-04-06 ENCOUNTER — Encounter: Payer: Self-pay | Admitting: Family Medicine

## 2013-04-06 VITALS — BP 120/82 | HR 91 | Resp 16 | Ht 73.0 in | Wt 270.1 lb

## 2013-04-06 DIAGNOSIS — I10 Essential (primary) hypertension: Secondary | ICD-10-CM

## 2013-04-06 DIAGNOSIS — E8881 Metabolic syndrome: Secondary | ICD-10-CM | POA: Insufficient documentation

## 2013-04-06 DIAGNOSIS — R7309 Other abnormal glucose: Secondary | ICD-10-CM

## 2013-04-06 DIAGNOSIS — R7303 Prediabetes: Secondary | ICD-10-CM | POA: Insufficient documentation

## 2013-04-06 DIAGNOSIS — G43109 Migraine with aura, not intractable, without status migrainosus: Secondary | ICD-10-CM

## 2013-04-06 DIAGNOSIS — M25519 Pain in unspecified shoulder: Secondary | ICD-10-CM

## 2013-04-06 DIAGNOSIS — J302 Other seasonal allergic rhinitis: Secondary | ICD-10-CM

## 2013-04-06 DIAGNOSIS — M25511 Pain in right shoulder: Secondary | ICD-10-CM

## 2013-04-06 DIAGNOSIS — J309 Allergic rhinitis, unspecified: Secondary | ICD-10-CM

## 2013-04-06 DIAGNOSIS — J449 Chronic obstructive pulmonary disease, unspecified: Secondary | ICD-10-CM

## 2013-04-06 DIAGNOSIS — F319 Bipolar disorder, unspecified: Secondary | ICD-10-CM

## 2013-04-06 DIAGNOSIS — F17200 Nicotine dependence, unspecified, uncomplicated: Secondary | ICD-10-CM

## 2013-04-06 DIAGNOSIS — E785 Hyperlipidemia, unspecified: Secondary | ICD-10-CM

## 2013-04-06 DIAGNOSIS — E669 Obesity, unspecified: Secondary | ICD-10-CM

## 2013-04-06 MED ORDER — TOPIRAMATE 25 MG PO TABS: 25.0000 mg | ORAL_TABLET | Freq: Two times a day (BID) | ORAL | Status: DC

## 2013-04-06 MED ORDER — LORATADINE 10 MG PO TABS: 10.0000 mg | ORAL_TABLET | Freq: Every day | ORAL | Status: DC

## 2013-04-06 MED ORDER — GABAPENTIN 400 MG PO CAPS: ORAL_CAPSULE | ORAL | Status: DC

## 2013-04-06 NOTE — Assessment & Plan Note (Signed)
Uncontrolled and increased in past 3 to 4 month.Pt has had inc anxieety blaming uncontrolled BP for headache however excellent BP at visit, he is  Reassured and this may reduce headache frequency also Start topamax, continue current dose propranolol, and abortive mked when needed

## 2013-04-06 NOTE — Assessment & Plan Note (Signed)
Followed by psych, recent inc in  Personal stresses, not suicidal or homicidal, increased anxiety

## 2013-04-06 NOTE — Assessment & Plan Note (Signed)
uncontrolled add claritin daily

## 2013-04-06 NOTE — Assessment & Plan Note (Signed)
Controlled, no change in medication DASH diet and commitment to daily physical activity for a minimum of 30 minutes discussed and encouraged, as a part of hypertension management. The importance of attaining a healthy weight is also discussed.  

## 2013-04-06 NOTE — Progress Notes (Signed)
Subjective:    Patient ID: Gavin Gray, male    DOB: Sep 03, 1965, 48 y.o.   MRN: [id number]  HPI The PT is here for follow up and re-evaluation of chronic medical conditions, medication management and review of any available recent lab and radiology data.  Preventive health is updated, specifically  Cancer screening and Immunization.   Questions or concerns regarding consultations or procedures which the PT has had in the interim are  Addressed.Recently saw his psychiatrist, and is under increased stress at home and due to family illness, Nicotine use is increased  The PT denies any adverse reactions to current medications since the last visit.  He is concerned that BP is uncontrolled,a nd this is causing increased headache frequencey on right side of head preceded with ringing in ear, mainly left . Has had migraines since childhood    Review of Systems See HPI Denies recent fever or chills. Denies sinus pressure, nasal congestion, ear pain or sore throat. Denies chest congestion, productive cough or wheezing. Denies chest pains, palpitations and leg swelling Denies abdominal pain, nausea, vomiting,diarrhea or constipation.   Denies dysuria, frequency, hesitancy or incontinence. Chronic joint pain and reduced mobility esp left upper extremity  Denies skin break down or rash.        Objective:   Physical Exam  BP 120/82  Pulse 91  Resp 16  Ht 6\' 1"  (1.854 m)  Wt 270 lb 1.9 oz (122.526 kg)  BMI 35.65 kg/m2  SpO2 96% Patient alert and oriented and in no cardiopulmonary distress.  HEENT: No facial asymmetry, EOMI, no sinus tenderness,  oropharynx pink and moist.  Neck decreased ROM, no adenopathy.  Chest: Clear to auscultation bilaterally.Decreased air entry  CVS: S1, S2 no murmurs, no S3.  ABD: Soft non tender. Bowel sounds normal.  Ext: No edema  MS: decreased  ROM spine, shoulders, hips and knees.Decreased ROM left elbow, contracture   Skin: Intact, no  ulcerations or rash noted.  Psych: Good eye contact, normal affect. Memory intact  anxious not  depressed appearing.  CNS: CN 2-12 intact, power,  normal throughout.       Assessment & Plan:  HYPERTENSION Controlled, no change in medication DASH diet and commitment to daily physical activity for a minimum of 30 minutes discussed and encouraged, as a part of hypertension management. The importance of attaining a healthy weight is also discussed.   Migraine with aura Uncontrolled and increased in past 3 to 4 month.Pt has had inc anxieety blaming uncontrolled BP for headache however excellent BP at visit, he is  Reassured and this may reduce headache frequency also Start topamax, continue current dose propranolol, and abortive mked when needed  HYPERLIPIDEMIA Hyperlipidemia:Low fat diet discussed and encouraged.  Updated lab needed at/ before next visit.   TOBACCO ABUSE Deteriorated, states increased stress is the problem Patient counseled for approximately 5 minutes regarding the health risks of ongoing nicotine use, specifically all types of cancer, heart disease, stroke and respiratory failure. The options available for help with cessation ,the behavioral changes to assist the process, and the option to either gradully reduce usage  Or abruptly stop.is also discussed. Pt is also encouraged to set specific goals in number of cigarettes used daily, as well as to set a quit date.   Shoulder pain, right Unjhanged, chronic pain med as before  OBESITY NOS Improved. Pt applauded on succesful weight loss through lifestyle change, and encouraged to continue same. Weight loss goal set for the next several  months.   DISORDER, BIPOLAR NOS Followed by psych, recent inc in  Personal stresses, not suicidal or homicidal, increased anxiety  COPD, MILD Worsening due to ongoing and increased nicotine Patient counseled for approximately 5 minutes regarding the health risks of ongoing  nicotine use, specifically all types of cancer, heart disease, stroke and respiratory failure. The options available for help with cessation ,the behavioral changes to assist the process, and the option to either gradully reduce usage  Or abruptly stop.is also discussed. Pt is also encouraged to set specific goals in number of cigarettes used daily, as well as to set a quit date.   Allergic rhinitis, seasonal uncontrolled add claritin daily

## 2013-04-06 NOTE — Assessment & Plan Note (Signed)
Deteriorated, states increased stress is the problem Patient counseled for approximately 5 minutes regarding the health risks of ongoing nicotine use, specifically all types of cancer, heart disease, stroke and respiratory failure. The options available for help with cessation ,the behavioral changes to assist the process, and the option to either gradully reduce usage  Or abruptly stop.is also discussed. Pt is also encouraged to set specific goals in number of cigarettes used daily, as well as to set a quit date.

## 2013-04-06 NOTE — Assessment & Plan Note (Signed)
Unjhanged, chronic pain med as before

## 2013-04-06 NOTE — Assessment & Plan Note (Signed)
Hyperlipidemia:Low fat diet discussed and encouraged.  Updated lab needed at/ before next visit.  

## 2013-04-06 NOTE — Assessment & Plan Note (Signed)
Worsening due to ongoing and increased nicotine Patient counseled for approximately 5 minutes regarding the health risks of ongoing nicotine use, specifically all types of cancer, heart disease, stroke and respiratory failure. The options available for help with cessation ,the behavioral changes to assist the process, and the option to either gradully reduce usage  Or abruptly stop.is also discussed. Pt is also encouraged to set specific goals in number of cigarettes used daily, as well as to set a quit date.

## 2013-04-06 NOTE — Patient Instructions (Addendum)
F/u as before  Labs 3 to 5 days before next visit.  Please cut back on cigarettes, you need to do this to improve your health  Blood pressure is normal, so this is not a cause of your headaches  New medication , take every day to reduce headache frequency, use maxalt when you get a headache.  You will get 2 tylenol tabs in office today for headache  Claritin will be added for allergies

## 2013-04-06 NOTE — Assessment & Plan Note (Signed)
Improved. Pt applauded on succesful weight loss through lifestyle change, and encouraged to continue same. Weight loss goal set for the next several months.  

## 2013-04-09 ENCOUNTER — Other Ambulatory Visit (HOSPITAL_COMMUNITY): Payer: Self-pay | Admitting: Psychiatry

## 2013-04-12 ENCOUNTER — Other Ambulatory Visit (HOSPITAL_COMMUNITY): Payer: Self-pay | Admitting: Psychiatry

## 2013-04-12 NOTE — Telephone Encounter (Signed)
Given on 03/02/13 with one more refill. Too soon to refill

## 2013-04-24 ENCOUNTER — Other Ambulatory Visit: Payer: Self-pay

## 2013-04-24 MED ORDER — OXYCODONE-ACETAMINOPHEN 10-325 MG PO TABS: 1.0000 | ORAL_TABLET | Freq: Four times a day (QID) | ORAL | Status: DC | PRN

## 2013-04-28 ENCOUNTER — Other Ambulatory Visit (HOSPITAL_COMMUNITY): Payer: Self-pay | Admitting: Psychiatry

## 2013-04-28 DIAGNOSIS — F319 Bipolar disorder, unspecified: Secondary | ICD-10-CM

## 2013-04-28 MED ORDER — HALOPERIDOL 10 MG PO TABS: ORAL_TABLET | ORAL | Status: DC

## 2013-04-30 ENCOUNTER — Ambulatory Visit (INDEPENDENT_AMBULATORY_CARE_PROVIDER_SITE_OTHER): Payer: 59 | Admitting: Psychiatry

## 2013-04-30 ENCOUNTER — Encounter (HOSPITAL_COMMUNITY): Payer: Self-pay | Admitting: Psychiatry

## 2013-04-30 VITALS — BP 140/90 | HR 64 | Ht 71.0 in | Wt 279.0 lb

## 2013-04-30 DIAGNOSIS — F319 Bipolar disorder, unspecified: Secondary | ICD-10-CM

## 2013-04-30 DIAGNOSIS — F329 Major depressive disorder, single episode, unspecified: Secondary | ICD-10-CM

## 2013-04-30 DIAGNOSIS — F29 Unspecified psychosis not due to a substance or known physiological condition: Secondary | ICD-10-CM

## 2013-04-30 DIAGNOSIS — F1011 Alcohol abuse, in remission: Secondary | ICD-10-CM

## 2013-04-30 MED ORDER — FLUOXETINE HCL 10 MG PO CAPS: 10.0000 mg | ORAL_CAPSULE | Freq: Every day | ORAL | Status: DC

## 2013-04-30 MED ORDER — ALPRAZOLAM 1 MG PO TABS: 1.0000 mg | ORAL_TABLET | Freq: Three times a day (TID) | ORAL | Status: DC | PRN

## 2013-04-30 MED ORDER — BENZTROPINE MESYLATE 2 MG PO TABS: ORAL_TABLET | ORAL | Status: DC

## 2013-04-30 MED ORDER — HALOPERIDOL 10 MG PO TABS: ORAL_TABLET | ORAL | Status: DC
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
## 2013-05-01 ENCOUNTER — Ambulatory Visit: Payer: Self-pay | Admitting: Family Medicine

## 2013-05-06 ENCOUNTER — Encounter: Payer: Self-pay | Admitting: Family Medicine

## 2013-05-06 ENCOUNTER — Ambulatory Visit (INDEPENDENT_AMBULATORY_CARE_PROVIDER_SITE_OTHER): Payer: 59 | Admitting: Family Medicine

## 2013-05-06 VITALS — BP 122/72 | HR 85 | Resp 16 | Ht 73.0 in | Wt 268.4 lb

## 2013-05-06 DIAGNOSIS — R1013 Epigastric pain: Secondary | ICD-10-CM

## 2013-05-06 DIAGNOSIS — F17200 Nicotine dependence, unspecified, uncomplicated: Secondary | ICD-10-CM

## 2013-05-06 DIAGNOSIS — I10 Essential (primary) hypertension: Secondary | ICD-10-CM

## 2013-05-06 DIAGNOSIS — G8929 Other chronic pain: Secondary | ICD-10-CM | POA: Insufficient documentation

## 2013-05-06 DIAGNOSIS — R7309 Other abnormal glucose: Secondary | ICD-10-CM

## 2013-05-06 DIAGNOSIS — J449 Chronic obstructive pulmonary disease, unspecified: Secondary | ICD-10-CM

## 2013-05-06 NOTE — Patient Instructions (Signed)
F/u in 4 month, call if you need me before  Please cut down on cigarettes  You are referred to Dr Arnoldo Morale re abdominal pain  Fall Prevention and Home Safety Falls cause injuries and can affect all age groups. It is possible to prevent falls.  HOW TO PREVENT FALLS  Wear shoes with rubber soles that do not have an opening for your toes.  Keep the inside and outside of your house well lit.  Use night lights throughout your home.  Remove clutter from floors.  Clean up floor spills.  Remove throw rugs or fasten them to the floor with carpet tape.  Do not place electrical cords across pathways.  Put grab bars by your tub, shower, and toilet. Do not use towel bars as grab bars.  Put handrails on both sides of the stairway. Fix loose handrails.  Do not climb on stools or stepladders, if possible.  Do not wax your floors.  Repair uneven or unsafe sidewalks, walkways, or stairs.  Keep items you use a lot within reach.  Be aware of pets.  Keep emergency numbers next to the telephone.  Put smoke detectors in your home and near bedrooms. Ask your doctor what other things you can do to prevent falls. Document Released: 12/09/2008 Document Revised: 08/14/2011 Document Reviewed: 05/15/2011 [facility] Patient Information 2014 [place], [place].

## 2013-05-12 ENCOUNTER — Ambulatory Visit (HOSPITAL_COMMUNITY): Payer: Self-pay | Admitting: Psychiatry

## 2013-05-21 ENCOUNTER — Other Ambulatory Visit: Payer: Self-pay

## 2013-05-21 ENCOUNTER — Ambulatory Visit (INDEPENDENT_AMBULATORY_CARE_PROVIDER_SITE_OTHER): Payer: 59 | Admitting: Psychiatry

## 2013-05-21 DIAGNOSIS — F319 Bipolar disorder, unspecified: Secondary | ICD-10-CM

## 2013-05-21 MED ORDER — OXYCODONE-ACETAMINOPHEN 10-325 MG PO TABS: 1.0000 | ORAL_TABLET | Freq: Four times a day (QID) | ORAL | Status: DC | PRN

## 2013-05-22 NOTE — Patient Instructions (Signed)
Discussed orally 

## 2013-05-22 NOTE — Progress Notes (Addendum)
   THERAPIST PROGRESS NOTE  Session Time: Thursday 05/21/2013  3:05 PM - 3:55 PM  Participation Level: Active  Behavioral Response: CasualAlertAnxious and Depressed  Type of Therapy: Individual Therapy  Treatment Goals addressed:  Improve ability to manage stress and anxiety      Improve assertiveness skills and ability to set and maintain boundaries      Interventions: Assertiveness Training, Supportive and Anger Management Training  Summary: Gavin Gray is a 48 y.o. male who presents with a long-standing history of recurrent periods of depression, mood swings, psychotic symptoms, and anxiety with at least one psychiatric hospitalization.  Patient is resuming therapy today after a 4 month absence. He reports less stress regarding his children as they have reconciled and he has improved efforts to set and maintain boundaries with the support of his girlfriend. He states knowing they have their own lives and that they're grown He continues to experience sadness and frustration regarding the lack of contact with his parents but expresses acceptance of the status of their relationship. He states missing talking with his father. He reports increased hallucinations and talking with someone named "[name]". He states thinking he is doing this to make up for the relationship with his dad. He denies any command hallucinations. He has resumed contact with one of his sisters. He reports stress related to continued health issues including decreased use of his right hand and excessive weight loss within the past few months. He is scheduled to see a gastroenterologist next week. Patient also reports financial stress.   Suicidal/Homicidal: No  Therapist Response: Therapist works with patient to process feelings, reinforce efforts to set and maintain boundaries, problem solve, identify early signs of anger and ways to intervene, identify ways to be assertive rather than aggressive  Plan: Return again in 4  weeks.  Diagnosis: Axis I: Bipolar Disorder    Axis II: Deferred    Kenda Kloehn, LCSW 05/22/2013

## 2013-05-27 ENCOUNTER — Other Ambulatory Visit: Payer: Self-pay | Admitting: Family Medicine

## 2013-06-11 ENCOUNTER — Other Ambulatory Visit: Payer: Self-pay | Admitting: Family Medicine

## 2013-06-11 ENCOUNTER — Other Ambulatory Visit: Payer: Self-pay

## 2013-06-11 MED ORDER — RIZATRIPTAN BENZOATE 10 MG PO TBDP: ORAL_TABLET | ORAL | Status: DC

## 2013-06-18 ENCOUNTER — Ambulatory Visit (HOSPITAL_COMMUNITY): Payer: Self-pay | Admitting: Psychiatry

## 2013-06-19 ENCOUNTER — Other Ambulatory Visit: Payer: Self-pay

## 2013-06-19 MED ORDER — OXYCODONE-ACETAMINOPHEN 10-325 MG PO TABS: 1.0000 | ORAL_TABLET | Freq: Four times a day (QID) | ORAL | Status: DC | PRN

## 2013-06-28 NOTE — Assessment & Plan Note (Signed)
uncontrolled , refer to surgery foe eval and manegement

## 2013-06-28 NOTE — Assessment & Plan Note (Signed)
Worsened, counseled to quit smoking

## 2013-06-28 NOTE — Assessment & Plan Note (Signed)
Patient educated about the importance of limiting  Carbohydrate intake , the need to commit to daily physical activity for a minimum of 30 minutes , and to commit weight loss. The fact that changes in all these areas will reduce or eliminate all together the development of diabetes is stressed.   Updated lab needed at/ before next visit.  

## 2013-06-28 NOTE — Assessment & Plan Note (Signed)
Worsened, sites stress the reason, counseled to reduce cigarettes with a view to quitting for health reasons

## 2013-06-28 NOTE — Assessment & Plan Note (Signed)
Controlled, no change in medication DASH diet and commitment to daily physical activity for a minimum of 30 minutes discussed and encouraged, as a part of hypertension management. The importance of attaining a healthy weight is also discussed.  

## 2013-06-28 NOTE — Progress Notes (Signed)
   Subjective:    Patient ID: Gavin Gray, male    DOB: 1965-04-15, 48 y.o.   MRN: [id number]  HPI The PT is here for follow up and re-evaluation of chronic medical conditions, medication management and review of any available recent lab and radiology data.  Preventive health is updated, specifically  Cancer screening and Immunization.   C/o chronic worsening abdominal pain which actually makes eating difficult to the extent that he has lost weight , wants surgical evaluation for this feels due to a hernia. Denies change in bowel movements, stringy  Or pebbly stool. No nausea or vomiting Increased personal stress and increase nicotine, unable and unwilling to set a quit date now. Not suicidal or homicidal, very upset with h his daughter's boyfriend who he holds responsible for her distancing herself and his grandchild from him Seeing mental health regulalrly      Review of Systems See HPI Denies recent fever or chills. Denies sinus pressure, nasal congestion, ear pain or sore throat. Denies chest congestion, productive cough or wheezing. Denies chest pains, palpitations and leg swelling Denies dysuria, frequency, hesitancy or incontinence. Chronic back and left upper extremity pain  and limitation in mobility. Denies headaches, seizures, numbness, or tingling.  Denies skin break down or rash.        Objective:   Physical Exam  BP 122/72  Pulse 85  Resp 16  Ht 6\' 1"  (1.854 m)  Wt 268 lb 6.4 oz (121.745 kg)  BMI 35.42 kg/m2  SpO2 96% Patient alert and oriented and in no cardiopulmonary distress.  HEENT: No facial asymmetry, EOMI, no sinus tenderness,  oropharynx pink and moist.  Neck supple no adenopathy.  Chest: Clear to auscultation bilaterally.Decfreased air entry throughout  CVS: S1, S2 no murmurs, no S3.  ABD: Soft non tender. Bowel sounds normal.No organomegaly or mass  Ext: No edema  MS: decreased ROM spine and left u[pper extremity, adequate in Right  shoulder,both  hips and knees.  Skin: Intact, no ulcerations or rash noted.  Psych: Good eye contact, normal affect. Memory intact  anxious and  depressed appearing.  CNS: CN 2-12 intact,      Assessment & Plan:  HYPERTENSION Controlled, no change in medication DASH diet and commitment to daily physical activity for a minimum of 30 minutes discussed and encouraged, as a part of hypertension management. The importance of attaining a healthy weight is also discussed.   Abdominal pain, chronic, epigastric uncontrolled , refer to surgery foe eval and manegement  TOBACCO ABUSE Worsened, sites stress the reason, counseled to reduce cigarettes with a view to quitting for health reasons  COPD, MILD Worsened, counseled to quit smoking  Prediabetes Patient educated about the importance of limiting  Carbohydrate intake , the need to commit to daily physical activity for a minimum of 30 minutes , and to commit weight loss. The fact that changes in all these areas will reduce or eliminate all together the development of diabetes is stressed.   Updated lab needed at/ before next visit.

## 2013-07-06 ENCOUNTER — Ambulatory Visit (INDEPENDENT_AMBULATORY_CARE_PROVIDER_SITE_OTHER): Payer: 59 | Admitting: Psychiatry

## 2013-07-06 DIAGNOSIS — F319 Bipolar disorder, unspecified: Secondary | ICD-10-CM
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
## 2013-07-06 NOTE — Patient Instructions (Signed)
Discussed orally 

## 2013-07-10 ENCOUNTER — Other Ambulatory Visit: Payer: Self-pay | Admitting: Family Medicine

## 2013-07-10 ENCOUNTER — Other Ambulatory Visit (HOSPITAL_COMMUNITY): Payer: Self-pay | Admitting: Psychiatry

## 2013-07-11 ENCOUNTER — Other Ambulatory Visit (HOSPITAL_COMMUNITY): Payer: Self-pay | Admitting: Psychiatry

## 2013-07-14 ENCOUNTER — Encounter (HOSPITAL_COMMUNITY): Payer: Self-pay | Admitting: Psychiatry

## 2013-07-14 ENCOUNTER — Ambulatory Visit (INDEPENDENT_AMBULATORY_CARE_PROVIDER_SITE_OTHER): Payer: 59 | Admitting: Psychiatry

## 2013-07-14 VITALS — BP 131/90 | HR 81 | Ht 73.0 in | Wt 250.4 lb

## 2013-07-14 DIAGNOSIS — F319 Bipolar disorder, unspecified: Secondary | ICD-10-CM

## 2013-07-14 MED ORDER — FLUOXETINE HCL 20 MG PO CAPS: 20.0000 mg | ORAL_CAPSULE | Freq: Every day | ORAL | Status: DC

## 2013-07-14 MED ORDER — HYDROXYZINE PAMOATE 25 MG PO CAPS: ORAL_CAPSULE | ORAL | Status: DC

## 2013-07-14 MED ORDER — ALPRAZOLAM 1 MG PO TABS: 1.0000 mg | ORAL_TABLET | Freq: Three times a day (TID) | ORAL | Status: DC | PRN
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
## 2013-07-17 ENCOUNTER — Other Ambulatory Visit: Payer: Self-pay

## 2013-07-17 MED ORDER — OXYCODONE-ACETAMINOPHEN 10-325 MG PO TABS: 1.0000 | ORAL_TABLET | Freq: Four times a day (QID) | ORAL | Status: DC | PRN

## 2013-07-29 ENCOUNTER — Ambulatory Visit (HOSPITAL_COMMUNITY): Payer: Self-pay | Admitting: Psychiatry

## 2013-07-30 ENCOUNTER — Other Ambulatory Visit: Payer: Self-pay | Admitting: Family Medicine

## 2013-07-30 ENCOUNTER — Ambulatory Visit (INDEPENDENT_AMBULATORY_CARE_PROVIDER_SITE_OTHER): Payer: 59 | Admitting: Psychiatry

## 2013-07-30 DIAGNOSIS — F319 Bipolar disorder, unspecified: Secondary | ICD-10-CM

## 2013-07-30 LAB — COMPREHENSIVE METABOLIC PANEL
ALK PHOS: 76 U/L (ref 39–117)
ALT: <8 U/L (ref 0–53)
AST: 6 U/L (ref 0–37)
Albumin: 3.8 g/dL (ref 3.5–5.2)
BUN: 11 mg/dL (ref 6–23)
CALCIUM: 8.7 mg/dL (ref 8.4–10.5)
CHLORIDE: 104 meq/L (ref 96–112)
CO2: 24 mEq/L (ref 19–32)
CREATININE: 1.09 mg/dL (ref 0.50–1.35)
Glucose, Bld: 85 mg/dL (ref 70–99)
Potassium: 4.6 mEq/L (ref 3.5–5.3)
Sodium: 137 mEq/L (ref 135–145)
Total Bilirubin: 0.3 mg/dL (ref 0.2–1.2)
Total Protein: 6.6 g/dL (ref 6.0–8.3)

## 2013-07-30 LAB — HEMOGLOBIN A1C
HEMOGLOBIN A1C: 5.5 % (ref <5.7)
Mean Plasma Glucose: 111 mg/dL (ref <117)

## 2013-07-30 LAB — LIPID PANEL
Cholesterol: 157 mg/dL (ref 0–200)
HDL: 25 mg/dL — AB (ref >39)
LDL Cholesterol: 103 mg/dL — ABNORMAL HIGH (ref 0–99)
TRIGLYCERIDES: 146 mg/dL (ref <150)
Total CHOL/HDL Ratio: 6.3 Ratio
VLDL: 29 mg/dL (ref 0–40)

## 2013-07-30 NOTE — Patient Instructions (Signed)
Discussed orally 

## 2013-07-30 NOTE — Progress Notes (Addendum)
   THERAPIST PROGRESS NOTE  Session Time: Thursday 07/30/2013 10:10 AM - 0:35 AM  Participation Level: Active  Behavioral Response: CasualAlertAnxious  Type of Therapy: Individual Therapy  Treatment Goals addressed: Improve ability to manage stress and anxiety    Interventions: CBT and Supportive  Summary: Gavin Gray. is a 48 y.o. male who presents with a long-standing history of recurrent periods of depression, mood swings, psychotic symptoms, and anxiety with at least one psychiatric hospitalization. Since last session, patient has seen psychiatrist Dr. Adele Schilder  for medication management and has been taking increased dosage of Prozac and taking Vistaril as prescribed. Patient states feeling better and calming down as well as experiencing improved sleep pattern, absence of hallucinations, and absence of paranoia along with decreased irritability and anger. He reports positive relationships with his children and his parents as well as his girlfriend. However, he continues to worry about his children and his parents although things seem to be going well. He is looking forward to visiting his daughter overnight tonight. He also has had recent contact with his grandchildren.  Suicidal/Homicidal: No  Therapist Response: Therapist works with patient to process his feelings, examine thought patterns and effects on mood and behavior, identify ways to improve structure and daily routine, identify coping and relaxation techniques  Plan: Return again in 4 weeks. Patient agrees to use plan your day handouts and bring to next session.  Diagnosis: Axis I: Bipolar disorder    Axis II: Deferred    Gavin Gotto, LCSW 07/30/2013

## 2013-07-31 ENCOUNTER — Other Ambulatory Visit: Payer: Self-pay

## 2013-07-31 LAB — PSA, MEDICARE: PSA: 0.79 ng/mL (ref <=4.00)

## 2013-07-31 LAB — VITAMIN D 25 HYDROXY (VIT D DEFICIENCY, FRACTURES): VIT D 25 HYDROXY: 18 ng/mL — AB (ref 30–89)

## 2013-07-31 MED ORDER — VITAMIN D (ERGOCALCIFEROL) 1.25 MG (50000 UNIT) PO CAPS: 50000.0000 [IU] | ORAL_CAPSULE | ORAL | Status: DC

## 2013-08-11 ENCOUNTER — Ambulatory Visit (INDEPENDENT_AMBULATORY_CARE_PROVIDER_SITE_OTHER): Payer: 59 | Admitting: Psychiatry

## 2013-08-11 ENCOUNTER — Encounter (HOSPITAL_COMMUNITY): Payer: Self-pay | Admitting: Psychiatry

## 2013-08-11 VITALS — BP 131/83 | Wt 257.6 lb

## 2013-08-11 DIAGNOSIS — F1011 Alcohol abuse, in remission: Secondary | ICD-10-CM

## 2013-08-11 DIAGNOSIS — F319 Bipolar disorder, unspecified: Secondary | ICD-10-CM

## 2013-08-11 DIAGNOSIS — F323 Major depressive disorder, single episode, severe with psychotic features: Secondary | ICD-10-CM

## 2013-08-11 MED ORDER — BENZTROPINE MESYLATE 2 MG PO TABS: ORAL_TABLET | ORAL | Status: DC

## 2013-08-11 MED ORDER — HALOPERIDOL 10 MG PO TABS: ORAL_TABLET | ORAL | Status: DC

## 2013-08-11 MED ORDER — FLUOXETINE HCL 20 MG PO CAPS: 20.0000 mg | ORAL_CAPSULE | Freq: Every day | ORAL | Status: DC

## 2013-08-11 MED ORDER — HYDROXYZINE PAMOATE 50 MG PO CAPS: 50.0000 mg | ORAL_CAPSULE | Freq: Every day | ORAL | Status: DC

## 2013-08-11 MED ORDER — ALPRAZOLAM 1 MG PO TABS: 1.0000 mg | ORAL_TABLET | Freq: Three times a day (TID) | ORAL | Status: DC | PRN
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
## 2013-08-12 ENCOUNTER — Other Ambulatory Visit: Payer: Self-pay

## 2013-08-12 MED ORDER — OXYCODONE-ACETAMINOPHEN 10-325 MG PO TABS: 1.0000 | ORAL_TABLET | Freq: Four times a day (QID) | ORAL | Status: DC | PRN

## 2013-08-18 ENCOUNTER — Telehealth: Payer: Self-pay | Admitting: Family Medicine

## 2013-08-18 NOTE — Telephone Encounter (Signed)
Noted, will look out for the additional info, he also needs to submit urine for drug screen asap , unsure when he is next due to collect medication from our office but that would be a good time to get the sample so pls order and follow through at next pick up  I am still looking out for result from the controlled subs log which you will send the basic info, if he has had another provider prescribe pain medication , will need oV  With  Me next week pls

## 2013-08-18 NOTE — Telephone Encounter (Signed)
Controlled substance log pulled for review.

## 2013-08-18 NOTE — Telephone Encounter (Signed)
Thanks he still needs a UDS when he next comes to collect script

## 2013-08-18 NOTE — Telephone Encounter (Signed)
Patient is only getting pain meds from this office and is getting xanax from psych.  Only meds noted on log.

## 2013-08-19 NOTE — Telephone Encounter (Signed)
Noted  

## 2013-08-27 ENCOUNTER — Encounter (HOSPITAL_COMMUNITY): Payer: Self-pay | Admitting: Psychiatry

## 2013-08-27 ENCOUNTER — Ambulatory Visit (HOSPITAL_COMMUNITY): Payer: Self-pay | Admitting: Psychiatry

## 2013-09-09 ENCOUNTER — Ambulatory Visit (INDEPENDENT_AMBULATORY_CARE_PROVIDER_SITE_OTHER): Payer: Self-pay | Admitting: Family Medicine

## 2013-09-09 ENCOUNTER — Encounter: Payer: Self-pay | Admitting: Family Medicine

## 2013-09-09 ENCOUNTER — Encounter (INDEPENDENT_AMBULATORY_CARE_PROVIDER_SITE_OTHER): Payer: Self-pay

## 2013-09-09 VITALS — BP 108/74 | HR 67 | Resp 18 | Ht 73.0 in | Wt 258.1 lb

## 2013-09-09 DIAGNOSIS — E785 Hyperlipidemia, unspecified: Secondary | ICD-10-CM

## 2013-09-09 DIAGNOSIS — H9193 Unspecified hearing loss, bilateral: Secondary | ICD-10-CM

## 2013-09-09 DIAGNOSIS — M25562 Pain in left knee: Secondary | ICD-10-CM

## 2013-09-09 DIAGNOSIS — R7303 Prediabetes: Secondary | ICD-10-CM

## 2013-09-09 DIAGNOSIS — M949 Disorder of cartilage, unspecified: Secondary | ICD-10-CM

## 2013-09-09 DIAGNOSIS — M899 Disorder of bone, unspecified: Secondary | ICD-10-CM

## 2013-09-09 DIAGNOSIS — I10 Essential (primary) hypertension: Secondary | ICD-10-CM

## 2013-09-09 DIAGNOSIS — Z0000 Encounter for general adult medical examination without abnormal findings: Secondary | ICD-10-CM

## 2013-09-09 MED ORDER — OXYCODONE-ACETAMINOPHEN 10-325 MG PO TABS: 1.0000 | ORAL_TABLET | Freq: Four times a day (QID) | ORAL | Status: DC | PRN

## 2013-09-09 NOTE — Patient Instructions (Addendum)
F/u in 4 months with rectal exam call if you need me before  You are referred to Dr Luna Glasgow re left knee buckling and pain  You are referred to Dr Benjamine Mola for hearing evaluation  CONGRATS on marked improvement in blood work, and excellent blood pressure  Please get your glasses to help with vision   Please get advanced directives documented as we discussed  Please work on smoking cessation  CBC, Vit D, fasting lipid and cmp in 4 month  Fall Prevention and [place] cause injuries and can affect all age groups. It is possible to prevent falls.  HOW TO PREVENT FALLS  Wear shoes with rubber soles that do not have an opening for your toes.  Keep the inside and outside of your house well lit.  Use night lights throughout your home.  Remove clutter from floors.  Clean up floor spills.  Remove throw rugs or fasten them to the floor with carpet tape.  Do not place electrical cords across pathways.  Put grab bars by your tub, shower, and toilet. Do not use towel bars as grab bars.  Put handrails on both sides of the stairway. Fix loose handrails.  Do not climb on stools or stepladders, if possible.  Do not wax your floors.  Repair uneven or unsafe sidewalks, walkways, or stairs.  Keep items you use a lot within reach.  Be aware of pets.  Keep emergency numbers next to the telephone.  Put smoke detectors in your home and near bedrooms. Ask your doctor what other things you can do to prevent falls. Document Released: 12/09/2008 Document Revised: 08/14/2011 Document Reviewed: 05/15/2011 [facility] Patient Information 2015 [place], [place]. This information is not intended to replace advice given to you by your health care provider. Make sure you discuss any questions you have with your health care provider.

## 2013-09-09 NOTE — Progress Notes (Signed)
Subjective:    Patient ID: Gavin Joiner., male    DOB: 12-05-1965, 48 y.o.   MRN: [id number]  HPI Preventive Screening-Counseling & Management   Patient present here today for a subsequent  Medicare annual wellness visit.   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 No  Over the past two weeks, have you felt little interest or pleasure in doing things? No  Have you lost interest or pleasure in daily life? No  Do you often feel hopeless? No  Do you cry easily over simple problems? No  Dx of schizophrenia Activities of Daily Living  In your present state of health, do you have any difficulty performing the following activities?  Driving?:yes, however has limitation due left upper extremity weakness Managing money?: yes, his ex spouse , who he is legally making his HPOA  Oversees this x 4 years Feeding yourself?:No Getting from bed to chair?: No Climbing a flight of stairs?: limitation due to right lower extremity weakness and pain, knee feels a sif it will give out at times x 2 years Preparing food and eating?: yes Bathing or showering?: yes takes up to 1 hour  Getting dressed?:  Yes due to weakness and reduced  mobility of left upper extremity takes up to 1 hour Getting to the toilet?: No Using the toilet?: No Moving around from place to place?: No  Fall Risk Assessment In the past year have you fallen or had a near fall?:Yes, fallen four times in past 12 month approx Are you currently taking any medications that make you dizzy?No   Hearing Difficulties: Yes Do you often ask people to speak up or repeat themselves?:Yes  Do you experience ringing or noises in your ears?:No Do you have difficulty understanding soft or whispered voices?:Yes   Cognitive Testing  Alert? Yes Normal Appearance?Yes  Oriented to person? Yes Place? Yes  Time? Yes  Displays appropriate judgment?Yes  Can read the correct time from a watch face? Yes  Are you having problems remembering things?No  Advanced Directives have been discussed with the patient?Yes , full code, and is in the process of getting advanced directives documented, ex spouse Gavin Gray is to be his HPOA,    List the Names of Other Physician/Practitioners you currently use: See Care Team List  1. Dr. Cay Schillings   Indicate any recent Medical Services you may have received from other than [facility] providers in the past year (date may be approximate).   Assessment:    Annual Wellness Exam   Plan:    .  Medicare Attestation  I have personally reviewed:  The patient's medical and social history  Their use of alcohol, tobacco or illicit drugs  Their  current medications and supplements  The patient's functional ability including ADLs,fall risks, home safety risks, cognitive, and hearing and visual impairment  Diet and physical activities  Evidence for depression or mood disorders  The patient's weight, height, BMI, and visual acuity have been recorded in the chart. I have made referrals, counseling, and provided education to the patient based on review of the above and I have provided the patient with a written personalized care plan  for preventive services.      Review of Systems     Objective:   Physical Exam BP 108/74  Pulse 67  Resp 18  Ht 6\' 1"  (1.854 m)  Wt 258 lb 1.9 oz (117.082 kg)  BMI 34.06 kg/m2  SpO2 95%        Assessment & Plan:  Medicare annual wellness visit, subsequent Annual exam as documented. Counseling done  re healthy lifestyle involving commitment to 150 minutes exercise per week, heart healthy diet, and attaining healthy weight.The importance of adequate sleep also discussed. Regular seat belt use and home safety, is also discussed. Changes in health habits are decided on by the patient with goals and time frames  set for achieving them. Immunization and cancer screening needs are specifically addressed at this visit.

## 2013-09-14 ENCOUNTER — Other Ambulatory Visit: Payer: Self-pay | Admitting: Family Medicine

## 2013-09-16 ENCOUNTER — Encounter: Payer: Self-pay | Admitting: Family Medicine

## 2013-09-17 ENCOUNTER — Ambulatory Visit (INDEPENDENT_AMBULATORY_CARE_PROVIDER_SITE_OTHER): Payer: Self-pay | Admitting: Otolaryngology

## 2013-09-25 ENCOUNTER — Other Ambulatory Visit: Payer: Self-pay | Admitting: Family Medicine

## 2013-10-09 ENCOUNTER — Other Ambulatory Visit: Payer: Self-pay

## 2013-10-09 MED ORDER — OXYCODONE-ACETAMINOPHEN 10-325 MG PO TABS: 1.0000 | ORAL_TABLET | Freq: Four times a day (QID) | ORAL | Status: DC | PRN

## 2013-10-12 ENCOUNTER — Other Ambulatory Visit: Payer: Self-pay

## 2013-10-12 ENCOUNTER — Telehealth: Payer: Self-pay

## 2013-10-12 MED ORDER — PROPRANOLOL HCL 20 MG PO TABS: ORAL_TABLET | ORAL | Status: DC

## 2013-10-12 NOTE — Telephone Encounter (Signed)
Propanolol refilled for 90 day supply.    Gabapentin refilled in July with additional refills.

## 2013-10-19 ENCOUNTER — Ambulatory Visit (INDEPENDENT_AMBULATORY_CARE_PROVIDER_SITE_OTHER): Payer: PRIVATE HEALTH INSURANCE | Admitting: Psychiatry

## 2013-10-19 DIAGNOSIS — F319 Bipolar disorder, unspecified: Secondary | ICD-10-CM

## 2013-10-19 NOTE — Patient Instructions (Signed)
Discussed orally 
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
## 2013-10-26 ENCOUNTER — Other Ambulatory Visit (HOSPITAL_COMMUNITY): Payer: Self-pay | Admitting: Psychiatry

## 2013-10-28 ENCOUNTER — Other Ambulatory Visit (HOSPITAL_COMMUNITY): Payer: Self-pay | Admitting: Psychiatry

## 2013-10-30 ENCOUNTER — Ambulatory Visit (HOSPITAL_COMMUNITY): Payer: Self-pay | Admitting: Psychiatry

## 2013-11-06 ENCOUNTER — Other Ambulatory Visit: Payer: Self-pay | Admitting: Family Medicine

## 2013-11-06 ENCOUNTER — Other Ambulatory Visit: Payer: Self-pay

## 2013-11-06 MED ORDER — OXYCODONE-ACETAMINOPHEN 10-325 MG PO TABS: 1.0000 | ORAL_TABLET | Freq: Four times a day (QID) | ORAL | Status: DC | PRN

## 2013-11-11 ENCOUNTER — Ambulatory Visit (HOSPITAL_COMMUNITY): Payer: Self-pay | Admitting: Psychiatry

## 2013-11-11 ENCOUNTER — Other Ambulatory Visit (HOSPITAL_COMMUNITY): Payer: Self-pay | Admitting: *Deleted

## 2013-11-11 DIAGNOSIS — F319 Bipolar disorder, unspecified: Secondary | ICD-10-CM

## 2013-11-11 MED ORDER — ALPRAZOLAM 1 MG PO TABS: 1.0000 mg | ORAL_TABLET | Freq: Three times a day (TID) | ORAL | Status: DC | PRN

## 2013-11-11 NOTE — Telephone Encounter (Signed)
Patient came for appt - did not know MD was out of office. Requested refill of Xanax - signed by Marinus Maw, NP in Dr. Marguerite Olea absence REquested vital signs taken: 121/92,HR 77, Wt 245.2lb

## 2013-12-01 ENCOUNTER — Ambulatory Visit (INDEPENDENT_AMBULATORY_CARE_PROVIDER_SITE_OTHER): Payer: PRIVATE HEALTH INSURANCE | Admitting: Psychiatry

## 2013-12-01 ENCOUNTER — Encounter (HOSPITAL_COMMUNITY): Payer: Self-pay | Admitting: Psychiatry

## 2013-12-01 VITALS — BP 117/81 | HR 63 | Ht 73.0 in | Wt 240.4 lb

## 2013-12-01 DIAGNOSIS — F1010 Alcohol abuse, uncomplicated: Secondary | ICD-10-CM

## 2013-12-01 DIAGNOSIS — F319 Bipolar disorder, unspecified: Secondary | ICD-10-CM

## 2013-12-01 MED ORDER — HYDROXYZINE PAMOATE 50 MG PO CAPS: 50.0000 mg | ORAL_CAPSULE | Freq: Every day | ORAL | Status: DC

## 2013-12-01 MED ORDER — FLUOXETINE HCL 20 MG PO CAPS: 20.0000 mg | ORAL_CAPSULE | Freq: Every day | ORAL | Status: DC

## 2013-12-01 MED ORDER — HALOPERIDOL 10 MG PO TABS: ORAL_TABLET | ORAL | Status: DC

## 2013-12-01 MED ORDER — ALPRAZOLAM 1 MG PO TABS: 1.0000 mg | ORAL_TABLET | Freq: Three times a day (TID) | ORAL | Status: DC | PRN

## 2013-12-01 MED ORDER — BENZTROPINE MESYLATE 2 MG PO TABS: 2.0000 mg | ORAL_TABLET | Freq: Every day | ORAL | Status: DC
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
## 2013-12-03 ENCOUNTER — Other Ambulatory Visit: Payer: Self-pay | Admitting: Family Medicine

## 2013-12-04 ENCOUNTER — Other Ambulatory Visit: Payer: Self-pay

## 2013-12-04 MED ORDER — OXYCODONE-ACETAMINOPHEN 10-325 MG PO TABS: 1.0000 | ORAL_TABLET | Freq: Four times a day (QID) | ORAL | Status: DC | PRN

## 2013-12-07 ENCOUNTER — Ambulatory Visit (INDEPENDENT_AMBULATORY_CARE_PROVIDER_SITE_OTHER): Payer: PRIVATE HEALTH INSURANCE

## 2013-12-07 DIAGNOSIS — Z23 Encounter for immunization: Secondary | ICD-10-CM

## 2013-12-21 ENCOUNTER — Encounter: Payer: Self-pay | Admitting: Family Medicine

## 2013-12-21 DIAGNOSIS — Z0000 Encounter for general adult medical examination without abnormal findings: Secondary | ICD-10-CM | POA: Insufficient documentation

## 2013-12-21 NOTE — Assessment & Plan Note (Signed)

## 2013-12-26 ENCOUNTER — Other Ambulatory Visit: Payer: Self-pay | Admitting: Family Medicine

## 2013-12-30 ENCOUNTER — Other Ambulatory Visit: Payer: Self-pay | Admitting: Family Medicine

## 2013-12-31 ENCOUNTER — Other Ambulatory Visit: Payer: Self-pay

## 2013-12-31 MED ORDER — OXYCODONE-ACETAMINOPHEN 10-325 MG PO TABS: 1.0000 | ORAL_TABLET | Freq: Four times a day (QID) | ORAL | Status: DC | PRN

## 2014-01-01 ENCOUNTER — Other Ambulatory Visit: Payer: Self-pay

## 2014-01-01 ENCOUNTER — Ambulatory Visit (HOSPITAL_COMMUNITY): Payer: Self-pay | Admitting: Psychiatry

## 2014-01-01 MED ORDER — DICLOFENAC SODIUM 75 MG PO TBEC: DELAYED_RELEASE_TABLET | ORAL | Status: DC

## 2014-01-08 ENCOUNTER — Other Ambulatory Visit: Payer: Self-pay | Admitting: Family Medicine

## 2014-01-08 ENCOUNTER — Telehealth: Payer: Self-pay | Admitting: Family Medicine

## 2014-01-08 NOTE — Telephone Encounter (Signed)
Pls call pt and explain that effecive 02/26/2014 due to formulary change needs to change from nexium to esomeprazole I am entering the med historically and papers are in your area (generic nexium)

## 2014-01-11 ENCOUNTER — Encounter (INDEPENDENT_AMBULATORY_CARE_PROVIDER_SITE_OTHER): Payer: Self-pay

## 2014-01-11 ENCOUNTER — Encounter: Payer: Self-pay | Admitting: Family Medicine

## 2014-01-11 ENCOUNTER — Other Ambulatory Visit: Payer: Self-pay

## 2014-01-11 ENCOUNTER — Ambulatory Visit (INDEPENDENT_AMBULATORY_CARE_PROVIDER_SITE_OTHER): Payer: PRIVATE HEALTH INSURANCE | Admitting: Family Medicine

## 2014-01-11 VITALS — BP 124/90 | HR 86 | Resp 16 | Ht 73.0 in | Wt 233.1 lb

## 2014-01-11 DIAGNOSIS — G8929 Other chronic pain: Secondary | ICD-10-CM

## 2014-01-11 DIAGNOSIS — R7309 Other abnormal glucose: Secondary | ICD-10-CM

## 2014-01-11 DIAGNOSIS — Z720 Tobacco use: Secondary | ICD-10-CM

## 2014-01-11 DIAGNOSIS — R634 Abnormal weight loss: Secondary | ICD-10-CM | POA: Insufficient documentation

## 2014-01-11 DIAGNOSIS — F17200 Nicotine dependence, unspecified, uncomplicated: Secondary | ICD-10-CM

## 2014-01-11 DIAGNOSIS — R1013 Epigastric pain: Secondary | ICD-10-CM

## 2014-01-11 DIAGNOSIS — M5440 Lumbago with sciatica, unspecified side: Secondary | ICD-10-CM

## 2014-01-11 DIAGNOSIS — R002 Palpitations: Secondary | ICD-10-CM

## 2014-01-11 DIAGNOSIS — E785 Hyperlipidemia, unspecified: Secondary | ICD-10-CM

## 2014-01-11 DIAGNOSIS — G43109 Migraine with aura, not intractable, without status migrainosus: Secondary | ICD-10-CM

## 2014-01-11 DIAGNOSIS — I10 Essential (primary) hypertension: Secondary | ICD-10-CM

## 2014-01-11 DIAGNOSIS — R7303 Prediabetes: Secondary | ICD-10-CM

## 2014-01-11 MED ORDER — VITAMIN D (ERGOCALCIFEROL) 1.25 MG (50000 UNIT) PO CAPS: 50000.0000 [IU] | ORAL_CAPSULE | ORAL | Status: DC

## 2014-01-11 MED ORDER — ESOMEPRAZOLE MAGNESIUM 40 MG PO CPDR: 40.0000 mg | DELAYED_RELEASE_CAPSULE | Freq: Every day | ORAL | Status: DC

## 2014-01-11 NOTE — Assessment & Plan Note (Signed)
New onset palpitation sin the last approx 2 to 3 months, intermittent, nort associated with anxiety, has had this in the past and had cardiology eval over 8 years ago, no significant cardiac pathology on med record. Will refer for re evaluation

## 2014-01-11 NOTE — Assessment & Plan Note (Signed)
Still c/o epigastric and upper abdominal pain post cholecystectomy, may need rept imaging study esp in light of abn weight loss

## 2014-01-11 NOTE — Assessment & Plan Note (Signed)
Ongoing nicotine use , cessation counseling done, not ready to set a quit date at this time Patient counseled for approximately 5 minutes regarding the health risks of ongoing nicotine use, specifically all types of cancer, heart disease, stroke and respiratory failure. The options available for help with cessation ,the behavioral changes to assist the process, and the option to either gradully reduce usage  Or abruptly stop.is also discussed. Pt is also encouraged to set specific goals in number of cigarettes used daily, as well as to set a quit date.

## 2014-01-11 NOTE — Telephone Encounter (Signed)
Sent med to pharmacy to start in jan 2016. Patient coming today for visit- will make him aware

## 2014-01-11 NOTE — Assessment & Plan Note (Signed)
Hyperlipidemia:Low fat diet discussed and encouraged.  Updated lab needed at/ before next visit.  

## 2014-01-11 NOTE — Assessment & Plan Note (Signed)
Chronic and unchanged, continue chronic meds as before

## 2014-01-11 NOTE — Assessment & Plan Note (Signed)
Sub optimal control, elevated diastolic pressure, but pt under increase stress and anxiety No med change  DASH diet and commitment to daily physical activity for a minimum of 30 minutes discussed and encouraged, as a part of hypertension management. The importance of attaining a healthy weight is also discussed.

## 2014-01-11 NOTE — Assessment & Plan Note (Addendum)
Pt needs stool testing, refuses rectal, needs to collect stool cards to return for testing, has questionably abn scan of abdomen in 2014, will rept imaging study

## 2014-01-11 NOTE — Assessment & Plan Note (Signed)
Patient educated about the importance of limiting  Carbohydrate intake , the need to commit to daily physical activity for a minimum of 30 minutes , and to commit weight loss. The fact that changes in all these areas will reduce or eliminate all together the development of diabetes is stressed.   Updated lab needed at/ before next visit.  

## 2014-01-11 NOTE — Assessment & Plan Note (Signed)
Reports decreased frequency and severity, continue chronic meds as before

## 2014-01-11 NOTE — Patient Instructions (Addendum)
F/u in 3.5 month, call if you need me before  Labs  In am , we will send to [facility] My condolence re loss of Mom   Please work on smoking cessation  Weekly vit D to continue for next 6 month, we will refill   Pls submit 3 stool cards for testing as colon ca screen   Ypou are referred to cardiologist re palpitations

## 2014-01-11 NOTE — Progress Notes (Signed)
Subjective:    Patient ID: Gavin Gray, male    DOB: [birth year], 48 y.o.   MRN: [id number]  HPI The PT is here for follow up and re-evaluation of chronic medical conditions, medication management and review of any available recent lab and radiology data.  Preventive health is updated, specifically  Cancer screening and Immunization.  Needs rectal but is refusing , will give stool cards Followed by psych, unable to even discuss his Mom's recent passing with his therapist, veryu sad about this The PT denies any adverse reactions to current medications since the last visit.  C/o recent palpitations, no specific chest pain, has ahd this problem in the past. C/o chronic epigastric and RUQ pain,unchanged and continues to lose weight  Increased depression following Mom's death, denies active suicidal or homicidal ideation     Review of Systems See HPI Denies recent fever or chills. Denies sinus pressure, nasal congestion, ear pain or sore throat. Denies chest congestion, productive cough or wheezing. Denies PND, orthopnea and leg swelling Denies abdominal pain, nausea, vomiting,diarrhea or constipation.   Denies dysuria, frequency, hesitancy or incontinence. Denies uncontrolled  headaches, seizures, numbness, or tingling. C/o depression, anxiety or insomnia. Denies skin break down or rash.        Objective:   Physical Exam  BP 124/90 mmHg  Pulse 86  Resp 16  Ht 6\' 1"  (1.854 m)  Wt 233 lb 1.9 oz (105.743 kg)  BMI 30.76 kg/m2  SpO2 98% Patient alert and oriented and in no cardiopulmonary distress.  HEENT: No facial asymmetry, EOMI,   oropharynx pink and moist.  Neck supple no JVD, no mass.  Chest: Clear to auscultation bilaterally.decreased though adequate air entry  CVS: S1, S2 no murmurs, no S3.Regular rate.  ABD: Soft non tender.   Ext: No edema  MS: decreased ROM  spine,  And left upper extremity, with contraction  Skin: Intact, no ulcerations or rash  noted.  Psych: Good eye contact, normal affect. Memory intact  anxious and  depressed appearing.  CNS: CN 2-12 intact, power,  normal throughout.      Assessment & Plan:  Essential hypertension Sub optimal control, elevated diastolic pressure, but pt under increase stress and anxiety No med change  DASH diet and commitment to daily physical activity for a minimum of 30 minutes discussed and encouraged, as a part of hypertension management. The importance of attaining a healthy weight is also discussed.   Prediabetes Patient educated about the importance of limiting  Carbohydrate intake , the need to commit to daily physical activity for a minimum of 30 minutes , and to commit weight loss. The fact that changes in all these areas will reduce or eliminate all together the development of diabetes is stressed.   Updated lab needed at/ before next visit.   TOBACCO ABUSE Ongoing nicotine use , cessation counseling done, not ready to set a quit date at this time Patient counseled for approximately 5 minutes regarding the health risks of ongoing nicotine use, specifically all types of cancer, heart disease, stroke and respiratory failure. The options available for help with cessation ,the behavioral changes to assist the process, and the option to either gradully reduce usage  Or abruptly stop.is also discussed. Pt is also encouraged to set specific goals in number of cigarettes used daily, as well as to set a quit date.   Hyperlipemia Hyperlipidemia:Low fat diet discussed and encouraged.  Updated lab needed at/ before next visit.   Back pain Chronic and  unchanged, continue chronic meds as before  Migraine with aura Reports decreased frequency and severity, continue chronic meds as before  Abdominal pain, chronic, epigastric Still c/o epigastric and upper abdominal pain post cholecystectomy, may need rept imaging study esp in light of abn weight loss  Weight loss,  unintentional Pt needs stool testing, refuses rectal, needs to collect stool cards to return for testing, has questionably abn scan of abdomen in 2014, will rept imaging study  Palpitations New onset palpitation sin the last approx 2 to 3 months, intermittent, nort associated with anxiety, has had this in the past and had cardiology eval over 8 years ago, no significant cardiac pathology on med record. Will refer for re evaluation

## 2014-01-14 ENCOUNTER — Ambulatory Visit (HOSPITAL_COMMUNITY): Admission: RE | Admit: 2014-01-14 | Payer: PRIVATE HEALTH INSURANCE | Source: Ambulatory Visit

## 2014-01-14 ENCOUNTER — Other Ambulatory Visit: Payer: Self-pay | Admitting: Family Medicine

## 2014-01-25 ENCOUNTER — Ambulatory Visit (HOSPITAL_COMMUNITY)
Admission: RE | Admit: 2014-01-25 | Discharge: 2014-01-25 | Disposition: A | Discharge status: Home / Self Care | Payer: PRIVATE HEALTH INSURANCE | Source: Ambulatory Visit | Attending: Family Medicine | Admitting: Family Medicine

## 2014-01-25 ENCOUNTER — Encounter (HOSPITAL_COMMUNITY): Payer: Self-pay

## 2014-01-25 DIAGNOSIS — R634 Abnormal weight loss: Secondary | ICD-10-CM | POA: Diagnosis present

## 2014-01-25 MED ORDER — IOHEXOL 300 MG/ML  SOLN
100.0000 mL | Freq: Once | INTRAMUSCULAR | Status: AC | PRN
Start: 2014-01-25 — End: 2014-01-25
  Administered 2014-01-25: 100 mL via INTRAVENOUS

## 2014-01-27 ENCOUNTER — Encounter: Payer: Self-pay | Admitting: Cardiology

## 2014-01-27 ENCOUNTER — Encounter: Payer: Self-pay | Admitting: *Deleted

## 2014-01-27 NOTE — Progress Notes (Signed)
No show  This encounter was created in error - please disregard.

## 2014-01-28 ENCOUNTER — Other Ambulatory Visit: Payer: Self-pay

## 2014-01-28 MED ORDER — OXYCODONE-ACETAMINOPHEN 10-325 MG PO TABS: 1.0000 | ORAL_TABLET | Freq: Four times a day (QID) | ORAL | Status: DC | PRN

## 2014-02-25 ENCOUNTER — Other Ambulatory Visit (HOSPITAL_COMMUNITY): Payer: Self-pay | Admitting: Psychiatry

## 2014-03-01 ENCOUNTER — Other Ambulatory Visit: Payer: Self-pay

## 2014-03-01 MED ORDER — OXYCODONE-ACETAMINOPHEN 10-325 MG PO TABS: 1.0000 | ORAL_TABLET | Freq: Four times a day (QID) | ORAL | Status: DC | PRN

## 2014-03-03 ENCOUNTER — Encounter (HOSPITAL_COMMUNITY): Payer: Self-pay | Admitting: Psychiatry

## 2014-03-03 ENCOUNTER — Ambulatory Visit (INDEPENDENT_AMBULATORY_CARE_PROVIDER_SITE_OTHER): Payer: Medicare Other | Admitting: Psychiatry

## 2014-03-03 VITALS — BP 114/85 | HR 82 | Ht 73.0 in | Wt 242.4 lb

## 2014-03-03 DIAGNOSIS — F319 Bipolar disorder, unspecified: Secondary | ICD-10-CM

## 2014-03-03 DIAGNOSIS — F1010 Alcohol abuse, uncomplicated: Secondary | ICD-10-CM

## 2014-03-03 MED ORDER — LITHIUM CARBONATE ER 450 MG PO TBCR: 450.0000 mg | EXTENDED_RELEASE_TABLET | Freq: Two times a day (BID) | ORAL | Status: DC

## 2014-03-03 MED ORDER — ALPRAZOLAM 1 MG PO TABS: 1.0000 mg | ORAL_TABLET | Freq: Three times a day (TID) | ORAL | Status: DC | PRN

## 2014-03-03 MED ORDER — HALOPERIDOL 10 MG PO TABS: ORAL_TABLET | ORAL | Status: DC

## 2014-03-03 MED ORDER — FLUOXETINE HCL 40 MG PO CAPS: 40.0000 mg | ORAL_CAPSULE | Freq: Every day | ORAL | Status: DC

## 2014-03-03 MED ORDER — BENZTROPINE MESYLATE 2 MG PO TABS: 2.0000 mg | ORAL_TABLET | Freq: Every day | ORAL | Status: DC
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
## 2014-03-19 DIAGNOSIS — J449 Chronic obstructive pulmonary disease, unspecified: Secondary | ICD-10-CM | POA: Diagnosis not present

## 2014-03-23 ENCOUNTER — Other Ambulatory Visit: Payer: Self-pay | Admitting: Family Medicine

## 2014-03-26 ENCOUNTER — Other Ambulatory Visit: Payer: Self-pay

## 2014-03-26 MED ORDER — OXYCODONE-ACETAMINOPHEN 10-325 MG PO TABS: 1.0000 | ORAL_TABLET | Freq: Four times a day (QID) | ORAL | Status: DC | PRN

## 2014-04-07 ENCOUNTER — Ambulatory Visit (INDEPENDENT_AMBULATORY_CARE_PROVIDER_SITE_OTHER): Payer: 59 | Admitting: Psychiatry

## 2014-04-07 DIAGNOSIS — F319 Bipolar disorder, unspecified: Secondary | ICD-10-CM

## 2014-04-07 NOTE — Patient Instructions (Signed)
Discussed orally 

## 2014-04-07 NOTE — Progress Notes (Signed)
    THERAPIST PROGRESS NOTE  Session Time: Wednesday 04/07/2014 11:05 AM - 11:50 AM  Participation Level: Active  Behavioral Response: CasualAlert/Anxious  Type of Therapy: Individual Therapy  Treatment Goals addressed: Improve ability to manage stress and anxiety  Interventions: Supportive  Summary: Gavin Gray is a 49 y.o. male who presents with  a long-standing history of recurrent periods of depression, mood swings, psychotic symptoms, and anxiety with at least one psychiatric hospitalization. Patient was last seen in August 2015. He is resuming services today per psychiatrist Dr. Marguerite Olea recommendation. Patient rates depression at 3 on 10 point scale with 10 being severe. He reports being less depressed since taking medication as prescribed by Dr. Adele Schilder . He does experience sadness along with grief and loss issues related to the death of his mother last year. He is glad he and his father have reconciled and reports they have frequent contact. However, he expresses frustration with one of his sisters who seems to try to hinder patient's relationship with his father. He denies SI, HI, and SIB. He also reports maintaining abstinence from alcohol He continues to experience anxiety and reports daily panic attacks. He attributes this to grief and loss issues. He also has concerns about his unexplained weight loss. He is scheduled for medical tests. He reports good relationship with children now but frequent arguments with son. However, they always reconcile per patient's report. He still has difficulty setting boundaries with children but says girlfriend is helping him with this. He reports continued strong support from girlfriend.   Suicidal/Homicidal: No  Therapist Response: Therapist works with patient to process feelings, identify ways to use support system, identify coping techniques  Plan: Return again in 2 weeks.  Diagnosis: Axis I: Bipolar Disorder    Axis II:  Deferred    Gavin Mchargue, LCSW 04/07/2014

## 2014-04-14 ENCOUNTER — Ambulatory Visit (INDEPENDENT_AMBULATORY_CARE_PROVIDER_SITE_OTHER): Payer: Medicare Other | Admitting: Psychiatry

## 2014-04-14 ENCOUNTER — Encounter (HOSPITAL_COMMUNITY): Payer: Self-pay | Admitting: Psychiatry

## 2014-04-14 VITALS — BP 120/80 | HR 66 | Ht 73.0 in | Wt 227.8 lb

## 2014-04-14 DIAGNOSIS — F319 Bipolar disorder, unspecified: Secondary | ICD-10-CM

## 2014-04-14 DIAGNOSIS — F1010 Alcohol abuse, uncomplicated: Secondary | ICD-10-CM

## 2014-04-14 MED ORDER — BENZTROPINE MESYLATE 2 MG PO TABS: 2.0000 mg | ORAL_TABLET | Freq: Every day | ORAL | Status: DC

## 2014-04-14 MED ORDER — ALPRAZOLAM 1 MG PO TABS: 1.0000 mg | ORAL_TABLET | Freq: Three times a day (TID) | ORAL | Status: DC | PRN

## 2014-04-14 MED ORDER — HALOPERIDOL 10 MG PO TABS: ORAL_TABLET | ORAL | Status: DC

## 2014-04-14 MED ORDER — LITHIUM CARBONATE ER 450 MG PO TBCR
450.0000 mg | EXTENDED_RELEASE_TABLET | Freq: Two times a day (BID) | ORAL | Status: DC
Start: 2014-04-14 — End: 2014-07-01

## 2014-04-14 MED ORDER — FLUOXETINE HCL 40 MG PO CAPS: 40.0000 mg | ORAL_CAPSULE | Freq: Every day | ORAL | Status: DC
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
## 2014-04-19 DIAGNOSIS — J449 Chronic obstructive pulmonary disease, unspecified: Secondary | ICD-10-CM | POA: Diagnosis not present

## 2014-04-22 ENCOUNTER — Other Ambulatory Visit: Payer: Self-pay

## 2014-04-22 ENCOUNTER — Telehealth (HOSPITAL_COMMUNITY): Payer: Self-pay | Admitting: *Deleted

## 2014-04-22 MED ORDER — OXYCODONE-ACETAMINOPHEN 10-325 MG PO TABS: 1.0000 | ORAL_TABLET | Freq: Four times a day (QID) | ORAL | Status: DC | PRN

## 2014-04-22 NOTE — Telephone Encounter (Signed)
lmtcb due to provider being out of office during appt time. number provided

## 2014-04-23 ENCOUNTER — Ambulatory Visit (HOSPITAL_COMMUNITY): Payer: Self-pay | Admitting: Psychiatry

## 2014-04-27 ENCOUNTER — Other Ambulatory Visit: Payer: Self-pay | Admitting: Family Medicine

## 2014-05-10 ENCOUNTER — Other Ambulatory Visit: Payer: Self-pay | Admitting: Family Medicine

## 2014-05-12 ENCOUNTER — Ambulatory Visit: Payer: Medicare Other | Admitting: Family Medicine

## 2014-05-12 DIAGNOSIS — E785 Hyperlipidemia, unspecified: Secondary | ICD-10-CM | POA: Diagnosis not present

## 2014-05-12 DIAGNOSIS — R7309 Other abnormal glucose: Secondary | ICD-10-CM | POA: Diagnosis not present

## 2014-05-12 DIAGNOSIS — I10 Essential (primary) hypertension: Secondary | ICD-10-CM | POA: Diagnosis not present

## 2014-05-12 DIAGNOSIS — F319 Bipolar disorder, unspecified: Secondary | ICD-10-CM | POA: Diagnosis not present

## 2014-05-18 DIAGNOSIS — J449 Chronic obstructive pulmonary disease, unspecified: Secondary | ICD-10-CM | POA: Diagnosis not present

## 2014-05-19 ENCOUNTER — Other Ambulatory Visit: Payer: Self-pay

## 2014-05-19 MED ORDER — OXYCODONE-ACETAMINOPHEN 10-325 MG PO TABS: 1.0000 | ORAL_TABLET | Freq: Four times a day (QID) | ORAL | Status: DC | PRN

## 2014-05-31 ENCOUNTER — Other Ambulatory Visit (HOSPITAL_COMMUNITY): Payer: Self-pay | Admitting: Psychiatry

## 2014-06-09 ENCOUNTER — Encounter: Payer: Self-pay | Admitting: Family Medicine

## 2014-06-09 ENCOUNTER — Ambulatory Visit (INDEPENDENT_AMBULATORY_CARE_PROVIDER_SITE_OTHER): Payer: Medicare Other | Admitting: Family Medicine

## 2014-06-09 ENCOUNTER — Telehealth (HOSPITAL_COMMUNITY): Payer: Self-pay | Admitting: Psychiatry

## 2014-06-09 ENCOUNTER — Telehealth: Payer: Self-pay

## 2014-06-09 VITALS — BP 90/60 | HR 68 | Resp 16 | Ht 73.0 in | Wt 227.1 lb

## 2014-06-09 DIAGNOSIS — R296 Repeated falls: Secondary | ICD-10-CM

## 2014-06-09 DIAGNOSIS — R2681 Unsteadiness on feet: Secondary | ICD-10-CM

## 2014-06-09 NOTE — Patient Instructions (Addendum)
You need to go directly to the ED, for further evaluation, I have spoken to the Ed doc  I will contact Dr Adele Schilder as promised also to let him know whats been happening to you  Labs 1 month ago looked good   F/U in 4 weeks  Hope that you feel better soon, I believe that you will

## 2014-06-09 NOTE — Telephone Encounter (Signed)
I returned patient's doctor phone call.  She was seen today at primary care physician office and found to be hypotensive and sent to the emergency room for further evaluation.  I suggested psychiatric evaluation since patient has history of mental illness and suspected lithium possibly could be overdose.

## 2014-06-09 NOTE — Telephone Encounter (Signed)
Patient was to leave office and go straight to the ER for evaluation of altered mental status. Patient never checked into [facility]. I tried to call patient and it went straight to voicemail. I left a message for patient to call me back

## 2014-06-10 ENCOUNTER — Telehealth (HOSPITAL_COMMUNITY): Payer: Self-pay

## 2014-06-11 ENCOUNTER — Telehealth: Payer: Self-pay | Admitting: Family Medicine

## 2014-06-11 NOTE — Progress Notes (Signed)
   Subjective:    Patient ID: Gavin Gray, male    DOB: [birth year], 49 y.o.   MRN: [id number]  HPI Pt in today with his partner, drowsy, slurrred speech and reporting multiple falls , over 8 in the past 2 weeks, his partner attributes major changes in behavior to his lithium. She states that they had not contacted his psychiatrist regarding concerns because of upcoming appointments with both myself and Dr Adele Schilder He had labs drawn about 4 weeks ago which show a normal lithium level as well as normal kidney and liver function There is no history of recent febrile illness There is no mention of deterioration in his mental health, when his partner is giving the history, when asked for instance about the number of falls, Dat states clearly "tell them the truth" and the report is that he has a had about 8 recent falls. He is oriented to time, person and place Pt did not state that he was suicidal or homicidal, and his partner expressed no concern in this regard.The expressed concern was as stated, imbalance, repeated falls and slurred speech I directly contacted the Ed Doc as well as his psychiatrist.  I eexplained to the patient and partner why it was important that pt go directly to the ed for further assessment , and my expressed concern was because of  his repeated falls and slurred speech, which may possibly have been  related to his lithium and needed to be checked. Nurse took patient to his car in a wheelchair , and at no time did I get the impression that Vinton was goiing anywhere other than to the emergency room for further evaluation, straight acros the road from the office His psychiatrist called back  after he had left the office, asked me dirrectly if he was suicidal , I stated that I did not directly ask the question, but that he nor his partner never alluded to this, he asked that I ask the Ed to have him assessed by the psych tem inj the ED , when I again called back the ED Doc to  speak further about the pt  Who I had asked him to expect approximately 30 mins to 1 hr before, the patient had still not arrived at the Ed Several attempts were made by nursing staff that same day to speak directly with patient to find out where he was and why he was not in the Ed as was planned, but no one answered the phone   Review of Systems See HPI     Objective:   Physical Exam BP 90/60 mmHg  Pulse 68  Resp 16  Ht 6\' 1"  (1.854 m)  Wt 227 lb 1.9 oz (103.021 kg)  BMI 29.97 kg/m2  SpO2 97% Patient drowsy and oriented x 3    HEENT: No facial asymmetry, EOMI,    Chest: Clear to auscultation bilaterally.  CVS: S1, S2 no murmurs, no S3.Regular rate.  ABD: Soft non tender.   Ext: No edema   Psych: Good eye contact when he actually raises his head to establish eye contact . Memory intact not anxious or depressed appearing.          Assessment & Plan:  Pt in today with his partner , very

## 2014-06-11 NOTE — Telephone Encounter (Signed)
Pt answered the phone, I spoke with him he stated he decided not to go to the ed the day of his visit States he stopped the lithium has fallen once in past 2 days and feels  much better I directly asked if he is suicidal or homicidal, he said NO I advised that he call today for appt with Dr Adele Schilder next week, he agreed to do so I told him we have been trying to contact him for 2 days and that  he should have let us know what was happening as we were concerned I told him that if he started to feel worse in any way, he should go to the Ed, he agreed

## 2014-06-11 NOTE — Assessment & Plan Note (Signed)
New onset with repeated falls, needs Ed evaluation, this is recommended and arranged, on day of visit

## 2014-06-11 NOTE — Assessment & Plan Note (Signed)
2 week h/o increased gait instability and repeated falls, associated with increased drowsiness, felt by patient and his partner to be related to lithium recently started on b psychiatry. Pt is drowsy and speech slightly slurred, though he is able to answer questions appropriately at the visit. Needs ED evaluation  This is explained and patient and partner agree, unfortunately however, patient did not go to the ED and attempts to contact him on the day of the visit as to why he did not follow through have been unsuccessful His psychiatrist is also made aware of the situation

## 2014-06-11 NOTE — Telephone Encounter (Signed)
I just spoke directly with the patient. States he is not suicidal or homicidal  When I directly asked him. States that  feels better,he  stopped the lithium and has had 1 fall since his visit 2 days ago I advised him to call and make appt to see you next week and to go to the Ed if he worsens in any way, he agreed

## 2014-06-11 NOTE — Telephone Encounter (Signed)
I returned Dr. Griffin Dakin call but I was told she is not in today.  I left a message to call us back.

## 2014-06-14 ENCOUNTER — Ambulatory Visit (HOSPITAL_COMMUNITY): Payer: Self-pay | Admitting: Psychiatry

## 2014-06-18 ENCOUNTER — Other Ambulatory Visit: Payer: Self-pay

## 2014-06-18 DIAGNOSIS — J449 Chronic obstructive pulmonary disease, unspecified: Secondary | ICD-10-CM | POA: Diagnosis not present

## 2014-06-18 MED ORDER — OXYCODONE-ACETAMINOPHEN 10-325 MG PO TABS: 1.0000 | ORAL_TABLET | Freq: Four times a day (QID) | ORAL | Status: DC | PRN

## 2014-06-25 ENCOUNTER — Ambulatory Visit (HOSPITAL_COMMUNITY): Payer: Self-pay | Admitting: Psychiatry

## 2014-06-29 ENCOUNTER — Other Ambulatory Visit: Payer: Self-pay | Admitting: Family Medicine

## 2014-06-29 ENCOUNTER — Other Ambulatory Visit (HOSPITAL_COMMUNITY): Payer: Self-pay | Admitting: Psychiatry

## 2014-06-29 ENCOUNTER — Encounter (HOSPITAL_COMMUNITY): Payer: Self-pay | Admitting: Psychiatry

## 2014-06-29 NOTE — Telephone Encounter (Signed)
Dr. Adele Schilder,   Patient last seen 04-14-14.  Patient no showed 06-24-2014.  Refill request for all medications where sent electronically.  Tried to contact patient, had to leave message for call back.

## 2014-06-30 NOTE — Telephone Encounter (Signed)
Patient need to be seen to prescribed medication.  We will send letter to scheduled appointment.

## 2014-07-01 ENCOUNTER — Ambulatory Visit (INDEPENDENT_AMBULATORY_CARE_PROVIDER_SITE_OTHER): Payer: Medicare Other | Admitting: Psychiatry

## 2014-07-01 ENCOUNTER — Encounter (HOSPITAL_COMMUNITY): Payer: Self-pay | Admitting: Psychiatry

## 2014-07-01 VITALS — BP 112/60 | HR 88 | Ht 73.0 in | Wt 219.0 lb

## 2014-07-01 DIAGNOSIS — F319 Bipolar disorder, unspecified: Secondary | ICD-10-CM

## 2014-07-01 DIAGNOSIS — F1021 Alcohol dependence, in remission: Secondary | ICD-10-CM | POA: Diagnosis not present

## 2014-07-01 MED ORDER — BENZTROPINE MESYLATE 2 MG PO TABS: 2.0000 mg | ORAL_TABLET | Freq: Every day | ORAL | Status: DC

## 2014-07-01 MED ORDER — FLUOXETINE HCL 40 MG PO CAPS: 40.0000 mg | ORAL_CAPSULE | Freq: Every day | ORAL | Status: DC

## 2014-07-01 MED ORDER — ALPRAZOLAM 1 MG PO TABS: 1.0000 mg | ORAL_TABLET | Freq: Three times a day (TID) | ORAL | Status: DC | PRN

## 2014-07-01 MED ORDER — HALOPERIDOL 10 MG PO TABS: ORAL_TABLET | ORAL | Status: DC
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
## 2014-07-07 ENCOUNTER — Ambulatory Visit: Payer: Medicare Other | Admitting: Family Medicine

## 2014-07-12 ENCOUNTER — Other Ambulatory Visit: Payer: Self-pay | Admitting: Family Medicine

## 2014-07-16 ENCOUNTER — Other Ambulatory Visit: Payer: Self-pay

## 2014-07-16 MED ORDER — OXYCODONE-ACETAMINOPHEN 10-325 MG PO TABS: 1.0000 | ORAL_TABLET | Freq: Four times a day (QID) | ORAL | Status: DC | PRN

## 2014-07-18 DIAGNOSIS — J449 Chronic obstructive pulmonary disease, unspecified: Secondary | ICD-10-CM | POA: Diagnosis not present

## 2014-07-19 ENCOUNTER — Ambulatory Visit (INDEPENDENT_AMBULATORY_CARE_PROVIDER_SITE_OTHER): Payer: Medicare Other | Admitting: Family Medicine

## 2014-07-19 ENCOUNTER — Encounter: Payer: Self-pay | Admitting: Family Medicine

## 2014-07-19 VITALS — BP 130/66 | HR 70 | Resp 18 | Ht 73.0 in | Wt 217.0 lb

## 2014-07-19 DIAGNOSIS — I10 Essential (primary) hypertension: Secondary | ICD-10-CM | POA: Diagnosis not present

## 2014-07-19 DIAGNOSIS — R634 Abnormal weight loss: Secondary | ICD-10-CM

## 2014-07-19 DIAGNOSIS — F3130 Bipolar disorder, current episode depressed, mild or moderate severity, unspecified: Secondary | ICD-10-CM

## 2014-07-19 DIAGNOSIS — Z720 Tobacco use: Secondary | ICD-10-CM | POA: Diagnosis not present

## 2014-07-19 DIAGNOSIS — R7309 Other abnormal glucose: Secondary | ICD-10-CM

## 2014-07-19 DIAGNOSIS — R05 Cough: Secondary | ICD-10-CM | POA: Diagnosis not present

## 2014-07-19 DIAGNOSIS — Z79899 Other long term (current) drug therapy: Secondary | ICD-10-CM

## 2014-07-19 DIAGNOSIS — R079 Chest pain, unspecified: Secondary | ICD-10-CM | POA: Insufficient documentation

## 2014-07-19 DIAGNOSIS — F17200 Nicotine dependence, unspecified, uncomplicated: Secondary | ICD-10-CM

## 2014-07-19 DIAGNOSIS — Z125 Encounter for screening for malignant neoplasm of prostate: Secondary | ICD-10-CM

## 2014-07-19 DIAGNOSIS — J449 Chronic obstructive pulmonary disease, unspecified: Secondary | ICD-10-CM

## 2014-07-19 DIAGNOSIS — E785 Hyperlipidemia, unspecified: Secondary | ICD-10-CM

## 2014-07-19 DIAGNOSIS — R059 Cough, unspecified: Secondary | ICD-10-CM

## 2014-07-19 DIAGNOSIS — R6889 Other general symptoms and signs: Secondary | ICD-10-CM

## 2014-07-19 DIAGNOSIS — E663 Overweight: Secondary | ICD-10-CM

## 2014-07-19 DIAGNOSIS — M5440 Lumbago with sciatica, unspecified side: Secondary | ICD-10-CM

## 2014-07-19 DIAGNOSIS — R072 Precordial pain: Secondary | ICD-10-CM

## 2014-07-19 DIAGNOSIS — R7303 Prediabetes: Secondary | ICD-10-CM

## 2014-07-19 MED ORDER — BUDESONIDE-FORMOTEROL FUMARATE 160-4.5 MCG/ACT IN AERO: 2.0000 | INHALATION_SPRAY | Freq: Two times a day (BID) | RESPIRATORY_TRACT | Status: DC

## 2014-07-19 NOTE — Patient Instructions (Addendum)
F/u in 4 month, call if you need me before  EKG today, please keep that appointment with the cardiologist this time, you were referred last year but I do not see that you went  Fasting labs June 6 , then you are referred for chest scan approx 2 days after, you will be contacted with the information  Use nicorette gum to help you to stop smoking, call [address] for help also  Commit to eating REGULARLY every day, you have lost 41 pounds since last July  One  additional inhalers to help with your breathing , but STOPPING SMOKING will help the most  Thanks for choosing [facility], we consider it a privelige to serve you.

## 2014-07-23 ENCOUNTER — Other Ambulatory Visit (HOSPITAL_COMMUNITY): Payer: Self-pay

## 2014-07-27 ENCOUNTER — Other Ambulatory Visit: Payer: Self-pay | Admitting: Family Medicine

## 2014-07-29 ENCOUNTER — Other Ambulatory Visit (HOSPITAL_COMMUNITY): Payer: Self-pay | Admitting: Psychiatry

## 2014-07-29 NOTE — Telephone Encounter (Signed)
Refill request sent via fax from patients Xanax as well as Haldol, Prozac, and Cogentin.  Pt seen 07-01-14. RX sent 07-01-14 with one refill.  I called [facility] [employer]. I spoke with Sunday Spillers.  I advised Sunday Spillers that all RX's where sent 07-01-14 with one refill.  Sunday Spillers put Tray on the phone the pharmacist.  Per Tray when pt calls in for refills system will send message to provider.  Per Tray pt has medications for June, they are being filled today and pt is good until July.

## 2014-08-03 ENCOUNTER — Encounter: Payer: Self-pay | Admitting: Family Medicine

## 2014-08-03 NOTE — Assessment & Plan Note (Signed)
Improved, however , pt has poor eating habits, very depressed since hi Mom 's death Patient re-educated about  the importance of commitment to a  minimum of 150 minutes of exercise per week.  The importance of healthy food choices with portion control discussed. Encouraged to start a food diary, count calories and to consider  joining a support group. Sample diet sheets offered. Goals set by the patient for the next several months.   Weight /BMI 07/19/2014 06/09/2014 01/11/2014  WEIGHT 217 lb 0.6 oz 227 lb 1.9 oz 233 lb 1.9 oz  HEIGHT 6\' 1"  6\' 1"  6\' 1"   BMI 28.64 kg/m2 29.97 kg/m2 30.76 kg/m2  Some encounter information is confidential and restricted. Go to Review Flowsheets activity to see all data.    Current exercise per week 90 minutes.

## 2014-08-03 NOTE — Assessment & Plan Note (Signed)
Atypical chest pain with multiple CV risk factors and abnormal EKG , refer to cardiologist and nicotine cessation counseling done

## 2014-08-03 NOTE — Assessment & Plan Note (Signed)
Deteriorating due to ongoing nicotine use , cessation counseling done, daily use of supportive med recommended

## 2014-08-03 NOTE — Progress Notes (Signed)
Subjective:    Patient ID: Gavin Gray, male    DOB: [birth year], 49 y.o.   MRN: [id number]  HPI The PT is here for follow up and re-evaluation of chronic medical conditions, medication management and review of any available recent lab and radiology data.  Preventive health is updated, specifically  Cancer screening and Immunization.   Recently saw psychiatrist and is no longer on lithium , with marked improved in mentation, back to baseline once toxicity subsided, still very depressed with poor appetite and ongoing weight loss The PT denies any adverse reactions to current medications since the last visit.  C/o new precordial chest pain, non radiating, no specific aggravating or relieving factors noted, no associated nausea, diaphoresis or light headedness, greatest risk factor for CVD currently is nicotine use in excess       Review of Systems See HPI Denies recent fever or chills. Denies sinus pressure, nasal congestion, ear pain or sore throat. Denies chest congestion, productive cough or wheezing. Denies PND, orthopnea, palpitations and leg swelling Denies abdominal pain, nausea, vomiting,diarrhea or constipation.   Denies dysuria, frequency, hesitancy or incontinence. C/ o chronic joint pain, swelling and limitation in mobility. C/o depression, anxiety or insomnia. Denies skin break down or rash.        Objective:   Physical Exam BP 130/66 mmHg  Pulse 70  Resp 18  Ht 6\' 1"  (1.854 m)  Wt 217 lb 0.6 oz (98.449 kg)  BMI 28.64 kg/m2  SpO2 100% Patient alert and oriented and in no cardiopulmonary distress.  HEENT: No facial asymmetry, EOMI,   oropharynx pink and moist.  Neck supple no JVD, no mass.  Chest: Clear to auscultation bilaterally.Decreased though adequate air   Entry, no reproducible chest wall pain  CVS: S1, S2 no murmurs, no S3.Regular rate.  ABD: Soft non tender.   Ext: No edema  MS: Adequate though reduced  ROM spine, shoulders, hips and  knees.  Skin: Intact, no ulcerations or rash noted.  Psych: Good eye contact, normal affect. Memory impaired  anxious and  depressed appearing.  CNS: CN 2-12 intact, power,  normal throughout.no focal deficits noted.        Assessment & Plan:  Chest pain Atypical chest pain with multiple CV risk factors and abnormal EKG , refer to cardiologist and nicotine cessation counseling done   TOBACCO ABUSE Unchanged Patient counseled for approximately 5 minutes regarding the health risks of ongoing nicotine use, specifically all types of cancer, heart disease, stroke and respiratory failure. The options available for help with cessation ,the behavioral changes to assist the process, and the option to either gradully reduce usage  Or abruptly stop.is also discussed. Pt is also encouraged to set specific goals in number of cigarettes used daily, as well as to set a quit date.  Number of cigarettes/cigars currently smoking daily: 20    Essential hypertension Controlled, no change in medication DASH diet and commitment to daily physical activity for a minimum of 30 minutes discussed and encouraged, as a part of hypertension management. The importance of attaining a healthy weight is also discussed.  BP/Weight 07/19/2014 06/09/2014 01/11/2014 09/09/2013 05/06/2013 04/06/2013 57/04/2200  Systolic BP 542 90 706 237 628 315 176  Diastolic BP 66 60 90 74 72 82 78  Wt. (Lbs) 217.04 227.12 233.12 258.12 268.4 270.12 289.8  BMI 28.64 29.97 30.76 34.06 35.42 35.65 38.24  Some encounter information is confidential and restricted. Go to Review Flowsheets activity to see all data.  Back pain Unchanged, chronic management as before   Overweight Improved, however , pt has poor eating habits, very depressed since hi Mom 's death Patient re-educated about  the importance of commitment to a  minimum of 150 minutes of exercise per week.  The importance of healthy food choices with portion  control discussed. Encouraged to start a food diary, count calories and to consider  joining a support group. Sample diet sheets offered. Goals set by the patient for the next several months.   Weight /BMI 07/19/2014 06/09/2014 01/11/2014  WEIGHT 217 lb 0.6 oz 227 lb 1.9 oz 233 lb 1.9 oz  HEIGHT 6\' 1"  6\' 1"  6\' 1"   BMI 28.64 kg/m2 29.97 kg/m2 30.76 kg/m2  Some encounter information is confidential and restricted. Go to Review Flowsheets activity to see all data.    Current exercise per week 90 minutes.    Prediabetes Resolved with lifestyle change and weight loss   Hyperlipemia Elevated TG otherwise normal which is excellent Hyperlipidemia:Low fat diet discussed and encouraged.   Lipid Panel  Scanned in 2016 results       COPD, moderate Deteriorating due to ongoing nicotine use , cessation counseling done, daily use of supportive med recommended   Bipolar disorder Uncontrolled, close f/u with mental health services recommended, not suicidal or homicidal, but ongoing weight loss and poor appetite

## 2014-08-03 NOTE — Assessment & Plan Note (Signed)
Resolved with lifestyle change and weight loss

## 2014-08-03 NOTE — Assessment & Plan Note (Signed)
Unchanged, chronic management as before

## 2014-08-03 NOTE — Assessment & Plan Note (Signed)
Unchanged Patient counseled for approximately 5 minutes regarding the health risks of ongoing nicotine use, specifically all types of cancer, heart disease, stroke and respiratory failure. The options available for help with cessation ,the behavioral changes to assist the process, and the option to either gradully reduce usage  Or abruptly stop.is also discussed. Pt is also encouraged to set specific goals in number of cigarettes used daily, as well as to set a quit date.  Number of cigarettes/cigars currently smoking daily: 20  

## 2014-08-03 NOTE — Assessment & Plan Note (Signed)
Controlled, no change in medication DASH diet and commitment to daily physical activity for a minimum of 30 minutes discussed and encouraged, as a part of hypertension management. The importance of attaining a healthy weight is also discussed.  BP/Weight 07/19/2014 06/09/2014 01/11/2014 09/09/2013 05/06/2013 04/06/2013 92/10/901  Systolic BP 014 90 996 924 932 419 914  Diastolic BP 66 60 90 74 72 82 78  Wt. (Lbs) 217.04 227.12 233.12 258.12 268.4 270.12 289.8  BMI 28.64 29.97 30.76 34.06 35.42 35.65 38.24  Some encounter information is confidential and restricted. Go to Review Flowsheets activity to see all data.

## 2014-08-03 NOTE — Assessment & Plan Note (Signed)
Uncontrolled, close f/u with mental health services recommended, not suicidal or homicidal, but ongoing weight loss and poor appetite

## 2014-08-03 NOTE — Assessment & Plan Note (Signed)
Elevated TG otherwise normal which is excellent Hyperlipidemia:Low fat diet discussed and encouraged.   Lipid Panel  Scanned in 2016 results

## 2014-08-04 ENCOUNTER — Ambulatory Visit (HOSPITAL_COMMUNITY): Admission: RE | Admit: 2014-08-04 | Payer: Medicare Other | Source: Ambulatory Visit

## 2014-08-13 ENCOUNTER — Ambulatory Visit (INDEPENDENT_AMBULATORY_CARE_PROVIDER_SITE_OTHER): Payer: Medicare Other | Admitting: Internal Medicine

## 2014-08-13 ENCOUNTER — Other Ambulatory Visit: Payer: Self-pay

## 2014-08-13 ENCOUNTER — Encounter: Payer: Self-pay | Admitting: Family Medicine

## 2014-08-13 ENCOUNTER — Telehealth: Payer: Self-pay | Admitting: Family Medicine

## 2014-08-13 ENCOUNTER — Encounter: Payer: Self-pay | Admitting: Internal Medicine

## 2014-08-13 VITALS — BP 126/84 | HR 80 | Ht 73.0 in | Wt 230.0 lb

## 2014-08-13 DIAGNOSIS — R0602 Shortness of breath: Secondary | ICD-10-CM

## 2014-08-13 DIAGNOSIS — E785 Hyperlipidemia, unspecified: Secondary | ICD-10-CM

## 2014-08-13 DIAGNOSIS — M25511 Pain in right shoulder: Secondary | ICD-10-CM

## 2014-08-13 DIAGNOSIS — M25561 Pain in right knee: Secondary | ICD-10-CM

## 2014-08-13 MED ORDER — OXYCODONE-ACETAMINOPHEN 10-325 MG PO TABS: 1.0000 | ORAL_TABLET | Freq: Four times a day (QID) | ORAL | Status: DC | PRN

## 2014-08-13 NOTE — Telephone Encounter (Signed)
Is it ok to place referral? 

## 2014-08-13 NOTE — Addendum Note (Signed)
Addended by: Denman George B on: 08/13/2014 12:37 PM   Modules accepted: Orders

## 2014-08-13 NOTE — Telephone Encounter (Signed)
Yes pls do

## 2014-08-13 NOTE — Patient Instructions (Signed)
Your physician wants you to follow-up in: 1 year with Dr Theressa Stamps will receive a reminder letter in the mail two months in advance. If you don't receive a letter, please call our office to schedule the follow-up appointment.   Your physician recommends that you continue on your current medications as directed. Please refer to the Current Medication list given to you today.     Your physician has requested that you have a lexiscan myoview. For further information please visit [email or web address]. Please follow instruction sheet, as given.  Please get FASTING lipids drawn   Thank you for choosing [facility] !

## 2014-08-13 NOTE — Telephone Encounter (Signed)
Referral entered  
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
## 2014-08-18 ENCOUNTER — Ambulatory Visit (HOSPITAL_COMMUNITY): Admission: RE | Admit: 2014-08-18 | Payer: Medicare Other | Source: Ambulatory Visit

## 2014-08-18 DIAGNOSIS — J449 Chronic obstructive pulmonary disease, unspecified: Secondary | ICD-10-CM | POA: Diagnosis not present

## 2014-08-23 ENCOUNTER — Encounter (HOSPITAL_COMMUNITY): Payer: Medicare Other

## 2014-08-23 ENCOUNTER — Inpatient Hospital Stay (HOSPITAL_COMMUNITY): Admission: RE | Admit: 2014-08-23 | Payer: Self-pay | Source: Ambulatory Visit

## 2014-08-25 ENCOUNTER — Encounter: Payer: Self-pay | Admitting: Family Medicine

## 2014-08-26 ENCOUNTER — Telehealth (HOSPITAL_COMMUNITY): Payer: Self-pay

## 2014-08-26 DIAGNOSIS — F319 Bipolar disorder, unspecified: Secondary | ICD-10-CM

## 2014-08-26 NOTE — Telephone Encounter (Signed)
Mediation refill requests for all medications - Telephone call with patient who reported his appointment was moved back from 09/01/14 to 09/20/14 due to provider being out of town but states he will need refills by 08/28/14 as he is running out of each.  Patient stated he last filled his Xanax on 07/29/14 and would need by Saturday along with Haldol, Prozac and Cogentin.  Requests new orders be called in befor 08/28/14.

## 2014-08-27 ENCOUNTER — Other Ambulatory Visit: Payer: Self-pay | Admitting: Family Medicine

## 2014-08-27 MED ORDER — ALPRAZOLAM 1 MG PO TABS: 1.0000 mg | ORAL_TABLET | Freq: Three times a day (TID) | ORAL | Status: DC | PRN

## 2014-08-27 MED ORDER — HALOPERIDOL 10 MG PO TABS: ORAL_TABLET | ORAL | Status: DC

## 2014-08-27 MED ORDER — BENZTROPINE MESYLATE 2 MG PO TABS: 2.0000 mg | ORAL_TABLET | Freq: Every day | ORAL | Status: DC

## 2014-08-27 MED ORDER — FLUOXETINE HCL 40 MG PO CAPS: 40.0000 mg | ORAL_CAPSULE | Freq: Every day | ORAL | Status: DC

## 2014-08-27 NOTE — Telephone Encounter (Signed)
Met with Dr. Arfeen who authorized refills of all patient's medications but no early refills.  Agreed to call in Xanax order and to instruct pharmacy no early refills. New one time orders for patient's Cogentin, Prozac, and Haldol e-scribed into patient's [facility] with instructions for no early refills and called in Xanax one time refill to the [facility] with Andrew, pharmacist.  Agreed no early refill on Xanax order and other medications e-scribed to their pharmacy this date. Attempted to reach patient but could not leave a message on his phone.   

## 2014-08-31 ENCOUNTER — Other Ambulatory Visit: Payer: Self-pay | Admitting: Family Medicine

## 2014-09-01 ENCOUNTER — Ambulatory Visit (HOSPITAL_COMMUNITY): Payer: Self-pay | Admitting: Psychiatry

## 2014-09-09 ENCOUNTER — Other Ambulatory Visit: Payer: Self-pay

## 2014-09-09 MED ORDER — OXYCODONE-ACETAMINOPHEN 10-325 MG PO TABS: 1.0000 | ORAL_TABLET | Freq: Four times a day (QID) | ORAL | Status: DC | PRN

## 2014-09-17 DIAGNOSIS — J449 Chronic obstructive pulmonary disease, unspecified: Secondary | ICD-10-CM | POA: Diagnosis not present

## 2014-09-20 ENCOUNTER — Ambulatory Visit (HOSPITAL_COMMUNITY): Payer: Self-pay | Admitting: Psychiatry

## 2014-09-23 ENCOUNTER — Ambulatory Visit (INDEPENDENT_AMBULATORY_CARE_PROVIDER_SITE_OTHER): Payer: 59 | Admitting: Psychiatry

## 2014-09-23 ENCOUNTER — Encounter (HOSPITAL_COMMUNITY): Payer: Self-pay | Admitting: Psychiatry

## 2014-09-23 VITALS — BP 128/84 | HR 85 | Ht 73.0 in | Wt 221.4 lb

## 2014-09-23 DIAGNOSIS — F1021 Alcohol dependence, in remission: Secondary | ICD-10-CM | POA: Diagnosis not present

## 2014-09-23 DIAGNOSIS — F319 Bipolar disorder, unspecified: Secondary | ICD-10-CM | POA: Diagnosis not present

## 2014-09-23 MED ORDER — FLUOXETINE HCL 40 MG PO CAPS: 40.0000 mg | ORAL_CAPSULE | Freq: Every day | ORAL | Status: DC

## 2014-09-23 MED ORDER — ALPRAZOLAM 1 MG PO TABS: 1.0000 mg | ORAL_TABLET | Freq: Three times a day (TID) | ORAL | Status: DC | PRN

## 2014-09-23 MED ORDER — HALOPERIDOL 10 MG PO TABS: ORAL_TABLET | ORAL | Status: DC

## 2014-09-23 MED ORDER — BENZTROPINE MESYLATE 2 MG PO TABS: 2.0000 mg | ORAL_TABLET | Freq: Every day | ORAL | Status: DC
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
## 2014-09-29 ENCOUNTER — Ambulatory Visit (HOSPITAL_COMMUNITY): Payer: Self-pay | Admitting: Psychiatry

## 2014-09-29 ENCOUNTER — Telehealth (HOSPITAL_COMMUNITY): Payer: Self-pay | Admitting: *Deleted

## 2014-09-29 ENCOUNTER — Encounter (HOSPITAL_COMMUNITY): Payer: Self-pay | Admitting: Psychiatry

## 2014-09-29 NOTE — Telephone Encounter (Signed)
Called pt only number on file and message came up and stated that the number you have called is unavailable right now. Was not able to lm. Office called pt at 1:42pm on 09-29-14.

## 2014-10-01 ENCOUNTER — Ambulatory Visit (HOSPITAL_COMMUNITY): Payer: Self-pay | Admitting: Psychiatry

## 2014-10-01 ENCOUNTER — Telehealth (HOSPITAL_COMMUNITY): Payer: Self-pay | Admitting: *Deleted

## 2014-10-01 ENCOUNTER — Other Ambulatory Visit: Payer: Self-pay

## 2014-10-01 MED ORDER — OXYCODONE-ACETAMINOPHEN 10-325 MG PO TABS: 1.0000 | ORAL_TABLET | Freq: Four times a day (QID) | ORAL | Status: DC | PRN

## 2014-10-06 ENCOUNTER — Telehealth: Payer: Self-pay | Admitting: Family Medicine

## 2014-10-06 ENCOUNTER — Telehealth (HOSPITAL_COMMUNITY): Payer: Self-pay

## 2014-10-06 DIAGNOSIS — F3162 Bipolar disorder, current episode mixed, moderate: Secondary | ICD-10-CM

## 2014-10-06 MED ORDER — GABAPENTIN 400 MG PO CAPS: 1200.0000 mg | ORAL_CAPSULE | Freq: Two times a day (BID) | ORAL | Status: DC

## 2014-10-06 NOTE — Telephone Encounter (Signed)
Patient aware.

## 2014-10-06 NOTE — Telephone Encounter (Signed)
Patient is calling requesting a refill on medication oxyCODONE-acetaminophen (PERCOCET) 10-325 MG per tablet he wants to pick up Rx tomorrow and also needs refill on gabapentin (NEURONTIN) 400 MG capsule please advise?

## 2014-10-06 NOTE — Telephone Encounter (Signed)
Medication management - patient left a message stating he was started on Lamictal at last evaluation but never received the medication from his pharmacy as order was not sent.  Verified with record patient was to begin new medication. Pt returns 11/03/14.

## 2014-10-06 NOTE — Telephone Encounter (Signed)
OPENED IN ERROR

## 2014-10-06 NOTE — Telephone Encounter (Signed)
Med ready to be collected and gabapentin refilled

## 2014-10-07 NOTE — Telephone Encounter (Signed)
Ok to refill 

## 2014-10-08 MED ORDER — LAMOTRIGINE 25 MG PO TABS: ORAL_TABLET | ORAL | Status: DC

## 2014-10-08 NOTE — Telephone Encounter (Signed)
New order e-scribed to patient's [facility] in [place] with instruction for patient to take 25mg , one tablet a day for one week and then to increase to 2 a day.  Patient to return for evaluation on 11/03/14.  Telephone call with patient to inform his new order to start Lamictal was sent to his pharmacy.  Directions were reviewed again with patient and reminded patient if he developed any signs of a rash while starting and taking Lamictal to stop the medication and call our office and obtain care if needed. Patient stated understanding and will follow up as needed.

## 2014-10-15 ENCOUNTER — Other Ambulatory Visit: Payer: Self-pay

## 2014-10-15 MED ORDER — OXYCODONE-ACETAMINOPHEN 10-325 MG PO TABS: 1.0000 | ORAL_TABLET | Freq: Four times a day (QID) | ORAL | Status: DC | PRN

## 2014-10-18 ENCOUNTER — Ambulatory Visit (HOSPITAL_COMMUNITY): Payer: Self-pay | Admitting: Psychiatry

## 2014-10-18 DIAGNOSIS — J449 Chronic obstructive pulmonary disease, unspecified: Secondary | ICD-10-CM | POA: Diagnosis not present

## 2014-10-25 ENCOUNTER — Telehealth (HOSPITAL_COMMUNITY): Payer: Self-pay

## 2014-10-25 DIAGNOSIS — F319 Bipolar disorder, unspecified: Secondary | ICD-10-CM

## 2014-10-25 MED ORDER — ALPRAZOLAM 1 MG PO TABS: 1.0000 mg | ORAL_TABLET | Freq: Three times a day (TID) | ORAL | Status: DC | PRN

## 2014-10-25 NOTE — Telephone Encounter (Signed)
Medication management - Patient called and reported his appointment was canceled and moved back from 10/18/14 to 11/03/14 and woudl need a new order for his Alprazolam called into [facility], last written 09/23/14 with no refills.

## 2014-10-25 NOTE — Telephone Encounter (Signed)
Met with Dr. Adele Schilder who approved a one time refill of patient's prescribed Alprazolam 1mg , one three times daily as needed, #90 with no refills.  Called in one time order with Mitzi Hansen, pharmacist at [facility] as instructed by Dr. Adele Schilder and called patient to inform order was called into his pharmacy this date and reminded of rescheduled appointment set for 11/03/14 at 3pm.

## 2014-10-27 ENCOUNTER — Other Ambulatory Visit: Payer: Self-pay | Admitting: Family Medicine

## 2014-11-03 ENCOUNTER — Ambulatory Visit: Payer: Self-pay

## 2014-11-03 ENCOUNTER — Other Ambulatory Visit: Payer: Self-pay

## 2014-11-03 ENCOUNTER — Ambulatory Visit (HOSPITAL_COMMUNITY): Payer: Self-pay | Admitting: Psychiatry

## 2014-11-03 ENCOUNTER — Telehealth: Payer: Self-pay | Admitting: Family Medicine

## 2014-11-03 DIAGNOSIS — J449 Chronic obstructive pulmonary disease, unspecified: Secondary | ICD-10-CM

## 2014-11-03 MED ORDER — PROAIR HFA 108 (90 BASE) MCG/ACT IN AERS: INHALATION_SPRAY | RESPIRATORY_TRACT | Status: DC

## 2014-11-03 MED ORDER — BUDESONIDE-FORMOTEROL FUMARATE 160-4.5 MCG/ACT IN AERO: 2.0000 | INHALATION_SPRAY | Freq: Two times a day (BID) | RESPIRATORY_TRACT | Status: DC

## 2014-11-03 NOTE — Telephone Encounter (Signed)
Gavin Gray is calling requesting a medication refill on PROAIR HFA 108 (90 BASE) MCG/ACT inhaler alsofluticasone (FLONASE) 50 MCG/ACT nasal spray which he states that it has caused a sore to form in his nose, and has questions should he continue this use? And he is also asking to come pick up today a Rx for  oxyCODONE-acetaminophen (PERCOCET) 10-325 MG per tablet, please advise?

## 2014-11-17 ENCOUNTER — Emergency Department
Admission: EM | Admit: 2014-11-17 | Discharge: 2014-11-17 | Disposition: A | Discharge status: Home / Self Care | Payer: Medicare Other | Attending: Emergency Medicine | Admitting: Emergency Medicine

## 2014-11-17 DIAGNOSIS — Z87891 Personal history of nicotine dependence: Secondary | ICD-10-CM | POA: Diagnosis not present

## 2014-11-17 DIAGNOSIS — F919 Conduct disorder, unspecified: Secondary | ICD-10-CM | POA: Diagnosis present

## 2014-11-17 DIAGNOSIS — I10 Essential (primary) hypertension: Secondary | ICD-10-CM | POA: Diagnosis not present

## 2014-11-17 DIAGNOSIS — E119 Type 2 diabetes mellitus without complications: Secondary | ICD-10-CM | POA: Insufficient documentation

## 2014-11-17 DIAGNOSIS — T1491 Suicide attempt: Secondary | ICD-10-CM | POA: Insufficient documentation

## 2014-11-17 DIAGNOSIS — Z79899 Other long term (current) drug therapy: Secondary | ICD-10-CM | POA: Insufficient documentation

## 2014-11-17 DIAGNOSIS — F1021 Alcohol dependence, in remission: Secondary | ICD-10-CM

## 2014-11-17 DIAGNOSIS — Z7951 Long term (current) use of inhaled steroids: Secondary | ICD-10-CM | POA: Diagnosis not present

## 2014-11-17 DIAGNOSIS — F3162 Bipolar disorder, current episode mixed, moderate: Secondary | ICD-10-CM | POA: Diagnosis not present

## 2014-11-17 DIAGNOSIS — F209 Schizophrenia, unspecified: Secondary | ICD-10-CM

## 2014-11-17 DIAGNOSIS — F10929 Alcohol use, unspecified with intoxication, unspecified: Secondary | ICD-10-CM

## 2014-11-17 DIAGNOSIS — Z7982 Long term (current) use of aspirin: Secondary | ICD-10-CM | POA: Insufficient documentation

## 2014-11-17 DIAGNOSIS — T1491XA Suicide attempt, initial encounter: Secondary | ICD-10-CM

## 2014-11-17 DIAGNOSIS — F319 Bipolar disorder, unspecified: Secondary | ICD-10-CM | POA: Diagnosis present

## 2014-11-17 DIAGNOSIS — R45851 Suicidal ideations: Secondary | ICD-10-CM | POA: Diagnosis not present

## 2014-11-17 LAB — CBC WITH DIFFERENTIAL/PLATELET
BASOS PCT: 1 %
Basophils Absolute: 0.1 10*3/uL (ref 0–0.1)
EOS ABS: 0.3 10*3/uL (ref 0–0.7)
EOS PCT: 3 %
HCT: 38 % — ABNORMAL LOW (ref 40.0–52.0)
Hemoglobin: 12.8 g/dL — ABNORMAL LOW (ref 13.0–18.0)
LYMPHS ABS: 4.6 10*3/uL — AB (ref 1.0–3.6)
Lymphocytes Relative: 42 %
MCH: 34.1 pg — AB (ref 26.0–34.0)
MCHC: 33.6 g/dL (ref 32.0–36.0)
MCV: 101.3 fL — ABNORMAL HIGH (ref 80.0–100.0)
Monocytes Absolute: 0.7 10*3/uL (ref 0.2–1.0)
Monocytes Relative: 6 %
NEUTROS PCT: 48 %
Neutro Abs: 5.2 10*3/uL (ref 1.4–6.5)
PLATELETS: 479 10*3/uL — AB (ref 150–440)
RBC: 3.75 MIL/uL — AB (ref 4.40–5.90)
RDW: 15.7 % — ABNORMAL HIGH (ref 11.5–14.5)
WBC: 10.9 10*3/uL — AB (ref 3.8–10.6)

## 2014-11-17 LAB — URINALYSIS COMPLETE WITH MICROSCOPIC (ARMC ONLY)
BILIRUBIN URINE: NEGATIVE
Bacteria, UA: NONE SEEN
Glucose, UA: NEGATIVE mg/dL
Hgb urine dipstick: NEGATIVE
Ketones, ur: NEGATIVE mg/dL
Leukocytes, UA: NEGATIVE
NITRITE: NEGATIVE
Protein, ur: NEGATIVE mg/dL
SPECIFIC GRAVITY, URINE: 1.021 (ref 1.005–1.030)
pH: 5 (ref 5.0–8.0)

## 2014-11-17 LAB — COMPREHENSIVE METABOLIC PANEL
ALK PHOS: 67 U/L (ref 38–126)
ALT: 10 U/L — AB (ref 17–63)
ANION GAP: 3 — AB (ref 5–15)
AST: 9 U/L — ABNORMAL LOW (ref 15–41)
Albumin: 4.1 g/dL (ref 3.5–5.0)
BILIRUBIN TOTAL: 0.4 mg/dL (ref 0.3–1.2)
BUN: 15 mg/dL (ref 6–20)
CALCIUM: 8.8 mg/dL — AB (ref 8.9–10.3)
CO2: 24 mmol/L (ref 22–32)
CREATININE: 1.15 mg/dL (ref 0.61–1.24)
Chloride: 116 mmol/L — ABNORMAL HIGH (ref 101–111)
Glucose, Bld: 103 mg/dL — ABNORMAL HIGH (ref 65–99)
Potassium: 3.7 mmol/L (ref 3.5–5.1)
Sodium: 143 mmol/L (ref 135–145)
TOTAL PROTEIN: 7.3 g/dL (ref 6.5–8.1)

## 2014-11-17 LAB — ETHANOL: ALCOHOL ETHYL (B): 202 mg/dL — AB (ref <5)

## 2014-11-17 MED ORDER — BUDESONIDE-FORMOTEROL FUMARATE 160-4.5 MCG/ACT IN AERO
2.0000 | INHALATION_SPRAY | Freq: Two times a day (BID) | RESPIRATORY_TRACT | Status: DC
  Filled 2014-11-17: qty 6

## 2014-11-17 MED ORDER — ALBUTEROL SULFATE (2.5 MG/3ML) 0.083% IN NEBU: 3.0000 mL | INHALATION_SOLUTION | Freq: Four times a day (QID) | RESPIRATORY_TRACT | Status: DC | PRN

## 2014-11-17 MED ORDER — LORAZEPAM 2 MG PO TABS
2.0000 mg | ORAL_TABLET | Freq: Once | ORAL | Status: DC
  Filled 2014-11-17: qty 1

## 2014-11-17 MED ORDER — DIPHENHYDRAMINE HCL 50 MG/ML IJ SOLN
50.0000 mg | Freq: Once | INTRAMUSCULAR | Status: DC
  Filled 2014-11-17: qty 1

## 2014-11-17 MED ORDER — ZIPRASIDONE MESYLATE 20 MG IM SOLR
INTRAMUSCULAR | Status: AC
  Filled 2014-11-17: qty 20

## 2014-11-17 MED ORDER — TOPIRAMATE 25 MG PO TABS
25.0000 mg | ORAL_TABLET | Freq: Two times a day (BID) | ORAL | Status: DC
  Administered 2014-11-17: 25 mg via ORAL

## 2014-11-17 MED ORDER — FLUOXETINE HCL 40 MG PO CAPS: 40.0000 mg | ORAL_CAPSULE | Freq: Every day | ORAL | Status: DC

## 2014-11-17 MED ORDER — OXYCODONE-ACETAMINOPHEN 5-325 MG PO TABS
ORAL_TABLET | ORAL | Status: AC
  Administered 2014-11-17: 2 via ORAL
  Filled 2014-11-17: qty 2

## 2014-11-17 MED ORDER — BENZTROPINE MESYLATE 2 MG PO TABS: 2.0000 mg | ORAL_TABLET | Freq: Every day | ORAL | Status: DC

## 2014-11-17 MED ORDER — HALOPERIDOL 5 MG PO TABS: 30.0000 mg | ORAL_TABLET | Freq: Every day | ORAL | Status: DC

## 2014-11-17 MED ORDER — HALOPERIDOL 10 MG PO TABS: 30.0000 mg | ORAL_TABLET | Freq: Every day | ORAL | Status: DC

## 2014-11-17 MED ORDER — BENZTROPINE MESYLATE 0.5 MG PO TABS
ORAL_TABLET | ORAL | Status: AC
  Filled 2014-11-17: qty 1

## 2014-11-17 MED ORDER — LORATADINE 10 MG PO TABS
10.0000 mg | ORAL_TABLET | Freq: Once | ORAL | Status: AC
  Administered 2014-11-17: 10 mg via ORAL

## 2014-11-17 MED ORDER — FAMOTIDINE 20 MG PO TABS
20.0000 mg | ORAL_TABLET | Freq: Every day | ORAL | Status: DC
  Administered 2014-11-17: 20 mg via ORAL

## 2014-11-17 MED ORDER — BENZTROPINE MESYLATE 1 MG PO TABS: 5.0000 mg | ORAL_TABLET | Freq: Every day | ORAL | Status: DC

## 2014-11-17 MED ORDER — HALOPERIDOL LACTATE 5 MG/ML IJ SOLN
5.0000 mg | Freq: Once | INTRAMUSCULAR | Status: DC
  Filled 2014-11-17: qty 1

## 2014-11-17 MED ORDER — OXYCODONE-ACETAMINOPHEN 5-325 MG PO TABS
1.0000 | ORAL_TABLET | Freq: Once | ORAL | Status: AC
  Administered 2014-11-17: 1 via ORAL
  Filled 2014-11-17: qty 1

## 2014-11-17 MED ORDER — GABAPENTIN 600 MG PO TABS
600.0000 mg | ORAL_TABLET | Freq: Once | ORAL | Status: AC
  Administered 2014-11-17: 600 mg via ORAL
  Filled 2014-11-17: qty 1

## 2014-11-17 MED ORDER — BENZTROPINE MESYLATE 0.5 MG PO TABS
1.0000 mg | ORAL_TABLET | Freq: Once | ORAL | Status: AC
  Administered 2014-11-17: 1 mg via ORAL

## 2014-11-17 MED ORDER — BENAZEPRIL HCL 20 MG PO TABS
20.0000 mg | ORAL_TABLET | Freq: Every day | ORAL | Status: DC
  Filled 2014-11-17: qty 1

## 2014-11-17 MED ORDER — FLUOXETINE HCL 20 MG PO CAPS
40.0000 mg | ORAL_CAPSULE | Freq: Every day | ORAL | Status: DC
  Administered 2014-11-17: 40 mg via ORAL

## 2014-11-17 MED ORDER — BENZTROPINE MESYLATE 1 MG PO TABS: 1.0000 mg | ORAL_TABLET | Freq: Once | ORAL | Status: DC

## 2014-11-17 MED ORDER — DIPHENHYDRAMINE HCL 50 MG/ML IJ SOLN
INTRAMUSCULAR | Status: AC
  Filled 2014-11-17: qty 1

## 2014-11-17 MED ORDER — HALOPERIDOL 5 MG PO TABS
5.0000 mg | ORAL_TABLET | Freq: Once | ORAL | Status: AC
  Administered 2014-11-17: 5 mg via ORAL

## 2014-11-17 MED ORDER — BENZTROPINE MESYLATE 1 MG PO TABS: 2.0000 mg | ORAL_TABLET | Freq: Every day | ORAL | Status: DC

## 2014-11-17 MED ORDER — LORATADINE 10 MG PO TABS
ORAL_TABLET | ORAL | Status: AC
  Administered 2014-11-17: 10 mg via ORAL
  Filled 2014-11-17: qty 1

## 2014-11-17 MED ORDER — OXYCODONE-ACETAMINOPHEN 5-325 MG PO TABS
2.0000 | ORAL_TABLET | Freq: Four times a day (QID) | ORAL | Status: DC | PRN
  Administered 2014-11-17: 2 via ORAL

## 2014-11-17 MED ORDER — TOPIRAMATE 25 MG PO TABS
ORAL_TABLET | ORAL | Status: AC
  Administered 2014-11-17: 25 mg via ORAL
  Filled 2014-11-17: qty 1

## 2014-11-17 MED ORDER — ALPRAZOLAM 0.5 MG PO TABS
1.0000 mg | ORAL_TABLET | Freq: Once | ORAL | Status: AC
  Administered 2014-11-17: 1 mg via ORAL

## 2014-11-17 MED ORDER — AMLODIPINE BESYLATE 5 MG PO TABS
5.0000 mg | ORAL_TABLET | Freq: Once | ORAL | Status: AC
  Administered 2014-11-17: 5 mg via ORAL
  Filled 2014-11-17: qty 1

## 2014-11-17 MED ORDER — FLUOXETINE HCL 20 MG PO CAPS
ORAL_CAPSULE | ORAL | Status: AC
  Administered 2014-11-17: 40 mg via ORAL
  Filled 2014-11-17: qty 2

## 2014-11-17 MED ORDER — BENZTROPINE MESYLATE 1 MG/ML IJ SOLN: 1.0000 mg | Freq: Once | INTRAMUSCULAR | Status: DC

## 2014-11-17 MED ORDER — GABAPENTIN 300 MG PO CAPS: 1200.0000 mg | ORAL_CAPSULE | Freq: Two times a day (BID) | ORAL | Status: DC

## 2014-11-17 MED ORDER — ALPRAZOLAM 0.5 MG PO TABS
1.0000 mg | ORAL_TABLET | Freq: Three times a day (TID) | ORAL | Status: DC | PRN
  Administered 2014-11-17: 1 mg via ORAL
  Filled 2014-11-17: qty 2

## 2014-11-17 MED ORDER — FAMOTIDINE 20 MG PO TABS
ORAL_TABLET | ORAL | Status: AC
  Administered 2014-11-17: 20 mg via ORAL
  Filled 2014-11-17: qty 1

## 2014-11-17 MED ORDER — PANTOPRAZOLE SODIUM 40 MG PO TBEC: 40.0000 mg | DELAYED_RELEASE_TABLET | Freq: Every day | ORAL | Status: DC

## 2014-11-17 MED ORDER — NICOTINE 10 MG IN INHA
1.0000 | RESPIRATORY_TRACT | Status: DC | PRN
  Administered 2014-11-17 (×2): 1 via RESPIRATORY_TRACT
  Filled 2014-11-17: qty 36

## 2014-11-17 MED ORDER — GABAPENTIN 600 MG PO TABS
ORAL_TABLET | ORAL | Status: AC
  Filled 2014-11-17: qty 2

## 2014-11-17 MED ORDER — HALOPERIDOL 5 MG PO TABS
5.0000 mg | ORAL_TABLET | Freq: Once | ORAL | Status: AC
  Administered 2014-11-17: 5 mg via ORAL
  Filled 2014-11-17: qty 1

## 2014-11-17 MED ORDER — NICOTINE 10 MG IN INHA
RESPIRATORY_TRACT | Status: AC
  Filled 2014-11-17: qty 36

## 2014-11-17 MED ORDER — ALPRAZOLAM 0.5 MG PO TABS
ORAL_TABLET | ORAL | Status: AC
  Administered 2014-11-17: 1 mg via ORAL
  Filled 2014-11-17: qty 2

## 2014-11-17 MED ORDER — HALOPERIDOL 5 MG PO TABS
ORAL_TABLET | ORAL | Status: AC
  Administered 2014-11-17: 5 mg via ORAL
  Filled 2014-11-17: qty 1

## 2014-11-17 NOTE — ED Notes (Signed)
ED BHU [place] Is the patient under IVC or is there intent for IVC: Yes.   Is the patient medically cleared: Yes.   Is there vacancy in the ED BHU: Yes.   Is the population mix appropriate for patient: Yes.   Is the patient awaiting placement in inpatient or outpatient setting: No. Has the patient had a psychiatric consult: No. Survey of unit performed for contraband, proper placement and condition of furniture, tampering with fixtures in bathroom, shower, and each patient room: Yes.  ; Findings:   APPEARANCE/BEHAVIOR calm and cooperative NEURO ASSESSMENT Orientation: time, place and person Hallucinations: No.None noted (Hallucinations) Speech: Normal Gait: normal RESPIRATORY ASSESSMENT Normal expansion.  Clear to auscultation.  No rales, rhonchi, or wheezing. CARDIOVASCULAR ASSESSMENT regular rate and rhythm, S1, S2 normal, no murmur, click, rub or gallop GASTROINTESTINAL ASSESSMENT drain WNL EXTREMITIES gait was normal for age PLAN OF CARE Provide calm/safe environment. Vital signs assessed twice daily. ED BHU Assessment once each 12-hour shift. Collaborate with intake RN daily or as condition indicates. Assure the ED provider has rounded once each shift. Provide and encourage hygiene. Provide redirection as needed. Assess for escalating behavior; address immediately and inform ED provider.  Assess family dynamic and appropriateness for visitation as needed: Yes.  ; If necessary, describe findings: Educate the patient/family about BHU procedures/visitation: No.; If necessary, describe findings

## 2014-11-17 NOTE — ED Notes (Signed)

## 2014-11-17 NOTE — Progress Notes (Signed)
LCSW met with patient and provided a Crisis Hotline card and resource list. Patient reported he will follow up with Dr Adele Schilder from [facility] and will also see his therapist Maurice March in [place]. He will take medications as prescribed and will access crisis services if required.
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
## 2014-11-17 NOTE — ED Notes (Signed)
Pt used phone to call spouse and let her know he is ok.

## 2014-11-17 NOTE — ED Notes (Signed)
Pt transferred to [facility] , security present , pt calm and cooperative, no distress noted , report given to [employer]
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
## 2014-11-17 NOTE — ED Notes (Signed)
Pt given breakfast tray with diet ginger ale
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
## 2014-11-17 NOTE — ED Notes (Addendum)
Pt states he is hearing voices currently. Pt heard saying "shut up" and waving his hands at the end of the stretcher as if stopping someone from coming toward him; there is not anyone around him.  pt states he has not taken his night time medications due to his excessive ETOH intake. MD aware.

## 2014-11-17 NOTE — ED Provider Notes (Signed)
-----------------------------------------   5:06 PM on 11/17/2014 -----------------------------------------  Patient was medically cleared prior to my arrival. Psychiatry evaluated the patient, they deemed him not a danger to self or others. Recent medications will be restarted. Return precautions for follow-up stressed. Psychiatry was the patient discharged.  Schuyler Amor, MD 11/17/14 [phone]

## 2014-11-17 NOTE — ED Notes (Signed)
Pt given supper tray and diet ginger ale

## 2014-11-17 NOTE — ED Notes (Signed)
Pt out of shower, returned all items to tech and sitting in DR.

## 2014-11-17 NOTE — ED Notes (Signed)
Pt given diet ginger ale.

## 2014-11-17 NOTE — ED Notes (Signed)
BEHAVIORAL HEALTH ROUNDING Patient sleeping: No. Patient alert and oriented: yes Behavior appropriate: Yes.  ; If no, describe:  Nutrition and fluids offered: Yes  Toileting and hygiene offered: Yes  Sitter present: no Law enforcement present: Yes  

## 2014-11-17 NOTE — Discharge Instructions (Signed)
Schizophrenia °Schizophrenia is a mental illness. It may cause disturbed or disorganized thinking, speech, or behavior. People with schizophrenia have problems functioning in one or more areas of life: work, school, home, or relationships. People with schizophrenia are at increased risk for suicide, certain chronic physical illnesses, and unhealthy behaviors, such as smoking and drug use. °People who have family members with schizophrenia are at higher risk of developing the illness. Schizophrenia affects men and women equally but usually appears at an earlier age (teenage or early adult years) in men.  °SYMPTOMS °The earliest symptoms are often subtle (prodrome) and may go unnoticed until the illness becomes more severe (first-break psychosis). Symptoms of schizophrenia may be continuous or may come and go in severity. Episodes often are triggered by major life events, such as family stress, college, military service, marriage, pregnancy or child birth, divorce, or loss of a loved one. People with schizophrenia may see, hear, or feel things that do not exist (hallucinations). They may have false beliefs in spite of obvious proof to the contrary (delusions). Sometimes speech is incoherent or behavior is odd or withdrawn.  °DIAGNOSIS °Schizophrenia is diagnosed through an assessment by your caregiver. Your caregiver will ask questions about your thoughts, behavior, mood, and ability to function in daily life. Your caregiver may ask questions about your medical history and use of alcohol or drugs, including prescription medication. Your caregiver may also order blood tests and imaging exams. Certain medical conditions and substances can cause symptoms that resemble schizophrenia. Your caregiver may refer you to a mental health specialist for evaluation. There are three major criterion for a diagnosis of schizophrenia: °· Two or more of the following five symptoms are present for a month or longer: °¨ Delusions. Often  the delusions are that you are being attacked, harassed, cheated, persecuted or conspired against (persecutory delusions). °¨ Hallucinations.   °¨ Disorganized speech that does not make sense to others. °¨ Grossly disorganized (confused or unfocused) behavior or extremely overactive or underactive motor activity (catatonia). °¨ Negative symptoms such as bland or blunted emotions (flat affect), loss of will power (avolition), and withdrawal from social contacts (social isolation). °· Level of functioning in one or more major areas of life (work, school, relationships, or self-care) is markedly below the level of functioning before the onset of illness.   °· There are continuous signs of illness (either mild symptoms or decreased level of functioning) for at least 6 months or longer. °TREATMENT  °Schizophrenia is a long-term illness. It is best controlled with continuous treatment rather than treatment only when symptoms occur. The following treatments are used to manage schizophrenia: °· Medication--Medication is the most effective and important form of treatment for schizophrenia. Antipsychotic medications are usually prescribed to help manage schizophrenia. Other types of medication may be added to relieve any symptoms that may occur despite the use of antipsychotic medications. °· Counseling or talk therapy--Individual, group, or family counseling may be helpful in providing education, support, and guidance. Many people with schizophrenia also benefit from social skills and job skills (vocational) training. °A combination of medication and counseling is best for managing the disorder over time. A procedure in which electricity is applied to the brain through the scalp (electroconvulsive therapy) may be used to treat catatonic schizophrenia or schizophrenia in people who cannot take or do not respond to medication and counseling. °Document Released: 02/10/2000 Document Revised: 10/15/2012 Document Reviewed:  05/07/2012 °ExitCare® Patient Information ©2015 [employer]. This information is not intended to replace advice given to you by   your health care provider. Make sure you discuss any questions you have with your health care provider. ° °

## 2014-11-17 NOTE — ED Notes (Signed)
BEHAVIORAL HEALTH ROUNDING Patient sleeping: No. Patient alert and oriented: yes Behavior appropriate: No.; If no, describe: agitated and anxious Nutrition and fluids offered: Yes  Toileting and hygiene offered: Yes  Sitter present: yes Law enforcement present: Yes

## 2014-11-17 NOTE — ED Notes (Signed)
Pt resting quietly on stretcher in hallway. Pt answering questions and appears less restless. States he is feeling better and thanked this nurse for his medications.

## 2014-11-17 NOTE — ED Notes (Signed)
BEHAVIORAL HEALTH ROUNDING Patient sleeping: No. Patient alert and oriented: yes Behavior appropriate: No.; If no, describe: pt agitated and restless. Talking to his "voices" Nutrition and fluids offered: Yes  Toileting and hygiene offered: Yes  Sitter present: yes Law enforcement present: Yes

## 2014-11-17 NOTE — ED Notes (Signed)
Dr. Adele Schilder with [facility] [phone]

## 2014-11-17 NOTE — ED Notes (Signed)
Report received from [employer]. Pt. Alert and oriented in no distress denies  and pain has improved since pain meds.  Pt. Instructed to come to me with problems or concerns.Will continue to monitor for safety  Q 15 minute checks, and security remains present

## 2014-11-17 NOTE — ED Notes (Signed)
MD at bedside for pt evaluation

## 2014-11-18 DIAGNOSIS — J449 Chronic obstructive pulmonary disease, unspecified: Secondary | ICD-10-CM | POA: Diagnosis not present

## 2014-11-23 ENCOUNTER — Ambulatory Visit (INDEPENDENT_AMBULATORY_CARE_PROVIDER_SITE_OTHER): Payer: Medicare Other | Admitting: Family Medicine

## 2014-11-23 ENCOUNTER — Encounter: Payer: Self-pay | Admitting: Family Medicine

## 2014-11-23 VITALS — BP 134/96 | HR 87 | Resp 16 | Ht 73.0 in | Wt 238.8 lb

## 2014-11-23 DIAGNOSIS — F17210 Nicotine dependence, cigarettes, uncomplicated: Secondary | ICD-10-CM

## 2014-11-23 DIAGNOSIS — E785 Hyperlipidemia, unspecified: Secondary | ICD-10-CM | POA: Diagnosis not present

## 2014-11-23 DIAGNOSIS — F17200 Nicotine dependence, unspecified, uncomplicated: Secondary | ICD-10-CM

## 2014-11-23 DIAGNOSIS — Z125 Encounter for screening for malignant neoplasm of prostate: Secondary | ICD-10-CM

## 2014-11-23 DIAGNOSIS — Z720 Tobacco use: Secondary | ICD-10-CM

## 2014-11-23 DIAGNOSIS — I10 Essential (primary) hypertension: Secondary | ICD-10-CM

## 2014-11-23 DIAGNOSIS — Z23 Encounter for immunization: Secondary | ICD-10-CM | POA: Diagnosis not present

## 2014-11-23 DIAGNOSIS — J449 Chronic obstructive pulmonary disease, unspecified: Secondary | ICD-10-CM

## 2014-11-23 DIAGNOSIS — K219 Gastro-esophageal reflux disease without esophagitis: Secondary | ICD-10-CM

## 2014-11-23 MED ORDER — OXYCODONE-ACETAMINOPHEN 10-325 MG PO TABS: 1.0000 | ORAL_TABLET | Freq: Four times a day (QID) | ORAL | Status: DC | PRN

## 2014-11-23 NOTE — Patient Instructions (Addendum)
F/u in 4 month, call if ypu need me sooner  Flu vaccine today  Nurse BP re check in 5 week  Pain med script will be given today  PSA, fasting lipid, cmp and EGFR in 5 weeks  Ear exam is normal  Plan to quit smoking by Feb 27, 2015, current is 10 per day

## 2014-11-23 NOTE — Progress Notes (Signed)
Subjective:    Patient ID: KEY CEN, male    DOB: 1965/09/11, 49 y.o.   MRN: [id number]  HPI   Gavin Gray     MRN: [id number]      DOB: 07-Apr-1965   HPI Gavin Gray is here for follow up and re-evaluation of chronic medical conditions, medication management and review of any available recent lab and radiology data.  Preventive health is updated, specifically  Cancer screening and Immunization.   Had to be involuntarily taken to the behavioral health ED recently states that he had been out of his psych meds for a short while , and had been unable to get them, presented to ED intoxicated, now back to his normal mental state The PT denies any adverse reactions to current medications since the last visit.  There are no specific complaints   ROS Denies recent fever or chills. Denies sinus pressure, nasal congestion, ear pain or sore throat. Denies chest congestion, productive cough or wheezing. Denies chest pains, palpitations and leg swelling Denies abdominal pain, nausea, vomiting,diarrhea or constipation.   Chronic  joint pain, and limitation in mobility. Denies headaches, seizures, numbness, or tingling. Denies uncontrolled  depression, anxiety or insomnia. Denies skin break down or rash.   PE  BP 134/96 mmHg  Pulse 87  Resp 16  Ht 6\' 1"  (1.854 m)  Wt 238 lb 12.8 oz (108.319 kg)  BMI 31.51 kg/m2  SpO2 94%  Patient alert and oriented and in no cardiopulmonary distress.  HEENT: No facial asymmetry, EOMI,   oropharynx pink and moist.  Neck supple no JVD, no mass.  Chest: Clear to auscultation bilaterally.decreased air entry throughout  CVS: S1, S2 no murmurs, no S3.Regular rate.  ABD: Soft non tender.   Ext: No edema  MS: Decreased  ROM spine, left shoulder and LUE, adequate in , hips and knees.  Skin: Intact, no ulcerations or rash noted.right peri orbital bruise present  Psych: Good eye contact, normal affect. Memory intact not anxious or depressed  appearing.  CNS: CN 2-12 intact, power,  normal throughout.no focal deficits noted.   Assessment & Plan   Essential hypertension UnControlled, no change in medication at [facility] visit , will re evaluate with in next 4 to 6 weeks DASH diet and commitment to daily physical activity for a minimum of 30 minutes discussed and encouraged, as a part of hypertension management. The importance of attaining a healthy weight is also discussed.  BP/Weight 11/23/2014 11/17/2014 08/13/2014 07/19/2014 06/09/2014 01/11/2014 06/07/8784  Systolic BP 767 209 470 962 90 836 629  Diastolic BP 96 98 84 66 60 90 74  Wt. (Lbs) 238.8 220 230 217.04 227.12 233.12 258.12  BMI 31.51 29.03 30.35 28.64 29.97 30.76 34.06  Some encounter information is confidential and restricted. Go to Review Flowsheets activity to see all data.        Bipolar disorder Recent decompensation necessitating mental health emergency  Service evaluation, pt also Was intoxicated and a risk to himself at time of ED presenntation   TOBACCO ABUSE Patient counseled for approximately 5 minutes regarding the health risks of ongoing nicotine use, specifically all types of cancer, heart disease, stroke and respiratory failure. The options available for help with cessation ,the behavioral changes to assist the process, and the option to either gradully reduce usage  Or abruptly stop.is also discussed. Pt is also encouraged to set specific goals in number of cigarettes used daily, as well as to set a quit date.  Number of  cigarettes/cigars currently smoking daily:20  COPD, moderate Worsening due tp continued nicotine use  GERD (gastroesophageal reflux disease) Controlled, no change in medication        Review of Systems     Objective:   Physical Exam        Assessment & Plan:

## 2014-11-24 ENCOUNTER — Telehealth: Payer: Self-pay | Admitting: *Deleted

## 2014-11-24 NOTE — Telephone Encounter (Signed)
meloxicam 15 mg one daily for facial pain, no new narcotics, send in 30 tabs and let him know pls
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
## 2014-11-25 ENCOUNTER — Telehealth (HOSPITAL_COMMUNITY): Payer: Self-pay

## 2014-11-25 ENCOUNTER — Ambulatory Visit (HOSPITAL_COMMUNITY): Payer: Self-pay | Admitting: Psychiatry

## 2014-11-25 ENCOUNTER — Other Ambulatory Visit (HOSPITAL_COMMUNITY): Payer: Self-pay | Admitting: Psychiatry

## 2014-11-25 DIAGNOSIS — F319 Bipolar disorder, unspecified: Secondary | ICD-10-CM

## 2014-11-25 MED ORDER — ALPRAZOLAM 1 MG PO TABS: 1.0000 mg | ORAL_TABLET | Freq: Three times a day (TID) | ORAL | Status: DC | PRN

## 2014-11-25 MED ORDER — MELOXICAM 15 MG PO TABS: 15.0000 mg | ORAL_TABLET | Freq: Every day | ORAL | Status: DC

## 2014-11-25 NOTE — Telephone Encounter (Signed)
Called in a 2 week supply of patient's prescribed Alprazolam 1mg , one 3 times a day, #42 with no refills per Dr. Adele Schilder order to [facility] in [place], [place] with Lytle Michaels, pharmacist.   Left patient a message Dr. Adele Schilder approved a 2 week supply of his prescribed Alprazolam but informed patient he must keep upcoming appointment 11/30/14 for any further refills.

## 2014-11-25 NOTE — Telephone Encounter (Signed)
Patient is aware.  Medication sent to pharmacy.

## 2014-11-25 NOTE — Telephone Encounter (Signed)
Provide 2 weeks of Xanax only until he seen on October 4.  S

## 2014-11-25 NOTE — Addendum Note (Signed)
Addended by: Denman George B on: 11/25/2014 09:29 AM   Modules accepted: Orders
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
## 2014-11-27 NOTE — Assessment & Plan Note (Signed)
UnControlled, no change in medication at [facility] visit , will re evaluate with in next 4 to 6 weeks DASH diet and commitment to daily physical activity for a minimum of 30 minutes discussed and encouraged, as a part of hypertension management. The importance of attaining a healthy weight is also discussed.  BP/Weight 11/23/2014 11/17/2014 08/13/2014 07/19/2014 06/09/2014 01/11/2014 2/44/0102  Systolic BP 725 366 440 347 90 425 956  Diastolic BP 96 98 84 66 60 90 74  Wt. (Lbs) 238.8 220 230 217.04 227.12 233.12 258.12  BMI 31.51 29.03 30.35 28.64 29.97 30.76 34.06  Some encounter information is confidential and restricted. Go to Review Flowsheets activity to see all data.
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
## 2014-11-27 NOTE — Assessment & Plan Note (Signed)

## 2014-11-27 NOTE — Assessment & Plan Note (Signed)
Controlled, no change in medication  

## 2014-11-27 NOTE — Assessment & Plan Note (Signed)
Worsening due tp continued nicotine use

## 2014-11-30 ENCOUNTER — Encounter (HOSPITAL_COMMUNITY): Payer: Self-pay | Admitting: Psychiatry

## 2014-12-01 NOTE — Progress Notes (Signed)
This encounter was created in error - please disregard.

## 2014-12-07 ENCOUNTER — Other Ambulatory Visit: Payer: Self-pay | Admitting: Family Medicine

## 2014-12-09 ENCOUNTER — Other Ambulatory Visit: Payer: Self-pay

## 2014-12-09 MED ORDER — RANITIDINE HCL 150 MG PO TABS: 150.0000 mg | ORAL_TABLET | Freq: Two times a day (BID) | ORAL | Status: DC

## 2014-12-16 ENCOUNTER — Telehealth (HOSPITAL_COMMUNITY): Payer: Self-pay

## 2014-12-16 DIAGNOSIS — F319 Bipolar disorder, unspecified: Secondary | ICD-10-CM

## 2014-12-16 MED ORDER — ALPRAZOLAM 1 MG PO TABS: 1.0000 mg | ORAL_TABLET | Freq: Three times a day (TID) | ORAL | Status: DC | PRN

## 2014-12-16 NOTE — Telephone Encounter (Signed)
Medication management - Patient called requesting a refill of his prescribed Alprazolam.  Pt. stated he recently was discharged from [facility] and must have forgotten his appointment set for 11/30/14 when he no showed.   Informed patient he needed to be seen for more refills and assisted him with being scheduled for follow up with Dr. Adele Schilder on Friday 12/17/14 at 11:00am.  Informed patient he would need to keep this appointment for further refills but patient stated he needed Xanax, at least enough for today and tonight's dosage as reports he is completely out today and cannot sleep without it.  Agreed to question if Dr. Adele Schilder would give dosages until his appointment on 12/17/14 and will call patient back this date.

## 2014-12-16 NOTE — Telephone Encounter (Signed)
Okay to provide 6 pills of Xanax.  Encouraged to keep appointment.

## 2014-12-16 NOTE — Telephone Encounter (Signed)
Met with Dr. Adele Schilder who authorized 6 pills be called in for patient for his Alprazolam $RemoveBefore'1mg'[id number]$ , one three times a day.  Called in order to [facility] with Lytle Michaels, pharmacist in [place] with no refills.  Called patient to inform 6 pills were called in for his requested Alprazolam and reminded of need to keep appointment now set for 12/17/14 at 11am for any further refills and patient stated agreeing and understanding plan.

## 2014-12-17 ENCOUNTER — Encounter (HOSPITAL_COMMUNITY): Payer: Self-pay | Admitting: Psychiatry

## 2014-12-17 ENCOUNTER — Ambulatory Visit (INDEPENDENT_AMBULATORY_CARE_PROVIDER_SITE_OTHER): Payer: 59 | Admitting: Psychiatry

## 2014-12-17 VITALS — BP 136/102 | HR 89 | Wt 234.2 lb

## 2014-12-17 DIAGNOSIS — F319 Bipolar disorder, unspecified: Secondary | ICD-10-CM | POA: Diagnosis not present

## 2014-12-17 DIAGNOSIS — F1010 Alcohol abuse, uncomplicated: Secondary | ICD-10-CM

## 2014-12-17 MED ORDER — HALOPERIDOL 10 MG PO TABS: 30.0000 mg | ORAL_TABLET | Freq: Every day | ORAL | Status: DC

## 2014-12-17 MED ORDER — BENZTROPINE MESYLATE 2 MG PO TABS: 2.0000 mg | ORAL_TABLET | Freq: Every day | ORAL | Status: DC

## 2014-12-17 MED ORDER — DISULFIRAM 250 MG PO TABS: 250.0000 mg | ORAL_TABLET | Freq: Every day | ORAL | Status: DC

## 2014-12-17 MED ORDER — ALPRAZOLAM 1 MG PO TABS: 1.0000 mg | ORAL_TABLET | Freq: Three times a day (TID) | ORAL | Status: DC | PRN

## 2014-12-17 MED ORDER — FLUOXETINE HCL 40 MG PO CAPS: 40.0000 mg | ORAL_CAPSULE | Freq: Every day | ORAL | Status: DC
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
## 2014-12-18 DIAGNOSIS — J449 Chronic obstructive pulmonary disease, unspecified: Secondary | ICD-10-CM | POA: Diagnosis not present

## 2014-12-22 ENCOUNTER — Other Ambulatory Visit: Payer: Self-pay

## 2014-12-22 ENCOUNTER — Ambulatory Visit: Payer: Medicare Other

## 2014-12-22 VITALS — BP 138/100

## 2014-12-22 DIAGNOSIS — I10 Essential (primary) hypertension: Secondary | ICD-10-CM

## 2014-12-22 MED ORDER — AMLODIPINE BESY-BENAZEPRIL HCL 10-20 MG PO CAPS: 1.0000 | ORAL_CAPSULE | Freq: Every day | ORAL | Status: DC

## 2014-12-22 MED ORDER — OXYCODONE-ACETAMINOPHEN 10-325 MG PO TABS: 1.0000 | ORAL_TABLET | Freq: Four times a day (QID) | ORAL | Status: DC | PRN

## 2014-12-22 NOTE — Patient Instructions (Signed)
Will increase lotrel to 10/20mg  once daily   Call back with any concerns  Keep follow up appt

## 2014-12-22 NOTE — Progress Notes (Signed)
Increased lotrel to 10/20 per dr. BP check in 4 weeks

## 2015-01-04 ENCOUNTER — Encounter (HOSPITAL_COMMUNITY): Payer: Self-pay | Admitting: Psychiatry

## 2015-01-04 ENCOUNTER — Ambulatory Visit (INDEPENDENT_AMBULATORY_CARE_PROVIDER_SITE_OTHER): Payer: 59 | Admitting: Psychiatry

## 2015-01-04 DIAGNOSIS — F319 Bipolar disorder, unspecified: Secondary | ICD-10-CM

## 2015-01-04 NOTE — Patient Instructions (Signed)
Discussed orally 

## 2015-01-04 NOTE — Addendum Note (Signed)
Addended by: Maurice Small E on: 01/04/2015 11:49 AM   Modules accepted: Level of Service
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
## 2015-01-05 ENCOUNTER — Other Ambulatory Visit: Payer: Self-pay | Admitting: Family Medicine

## 2015-01-07 ENCOUNTER — Telehealth: Payer: Self-pay | Admitting: Family Medicine

## 2015-01-07 NOTE — Telephone Encounter (Signed)
Patient aware that he can collect the week on the 21st but will not be able to fill until due.

## 2015-01-07 NOTE — Telephone Encounter (Signed)
Mr. Barsanti is asking if his medication oxyCODONE-acetaminophen (PERCOCET) 10-325 MG tablet can be ready for pick on 01/19/15 due to the holiday schedule, please advise?

## 2015-01-12 ENCOUNTER — Other Ambulatory Visit: Payer: Self-pay

## 2015-01-12 ENCOUNTER — Telehealth: Payer: Self-pay | Admitting: Family Medicine

## 2015-01-12 MED ORDER — RIZATRIPTAN BENZOATE 10 MG PO TBDP: 10.0000 mg | ORAL_TABLET | ORAL | Status: DC | PRN

## 2015-01-12 MED ORDER — PROPRANOLOL HCL 20 MG PO TABS: ORAL_TABLET | ORAL | Status: DC

## 2015-01-12 MED ORDER — DICLOFENAC SODIUM 75 MG PO TBEC: 75.0000 mg | DELAYED_RELEASE_TABLET | Freq: Two times a day (BID) | ORAL | Status: DC

## 2015-01-12 MED ORDER — OXYCODONE-ACETAMINOPHEN 10-325 MG PO TABS: 1.0000 | ORAL_TABLET | Freq: Four times a day (QID) | ORAL | Status: DC | PRN

## 2015-01-12 NOTE — Telephone Encounter (Signed)
Medications refilled

## 2015-01-12 NOTE — Telephone Encounter (Signed)
Patient needs a refill on medications diclofenac (VOLTAREN) 75 MG EC tablet , rizatriptan (MAXALT-MLT) 10 MG disintegrating tablet, propranolol (INDERAL) 20 MG tablet sent to [facility] in [place]

## 2015-01-18 DIAGNOSIS — J449 Chronic obstructive pulmonary disease, unspecified: Secondary | ICD-10-CM | POA: Diagnosis not present

## 2015-01-27 ENCOUNTER — Encounter (HOSPITAL_COMMUNITY): Payer: Self-pay | Admitting: Psychiatry

## 2015-01-27 ENCOUNTER — Ambulatory Visit (INDEPENDENT_AMBULATORY_CARE_PROVIDER_SITE_OTHER): Payer: 59 | Admitting: Psychiatry

## 2015-01-27 ENCOUNTER — Ambulatory Visit: Payer: Medicare Other

## 2015-01-27 VITALS — BP 98/62

## 2015-01-27 DIAGNOSIS — F319 Bipolar disorder, unspecified: Secondary | ICD-10-CM | POA: Diagnosis not present

## 2015-01-27 DIAGNOSIS — I10 Essential (primary) hypertension: Secondary | ICD-10-CM

## 2015-01-27 MED ORDER — PROPRANOLOL HCL 20 MG PO TABS
20.0000 mg | ORAL_TABLET | Freq: Four times a day (QID) | ORAL | Status: DC
Start: 2015-01-27 — End: 2015-10-03
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
## 2015-01-27 NOTE — Patient Instructions (Signed)
Discussed orally 

## 2015-01-31 ENCOUNTER — Ambulatory Visit (INDEPENDENT_AMBULATORY_CARE_PROVIDER_SITE_OTHER): Payer: 59 | Admitting: Psychiatry

## 2015-01-31 ENCOUNTER — Encounter (HOSPITAL_COMMUNITY): Payer: Self-pay | Admitting: Psychiatry

## 2015-01-31 VITALS — BP 103/76 | HR 66 | Ht 73.0 in | Wt 235.8 lb

## 2015-01-31 DIAGNOSIS — F1010 Alcohol abuse, uncomplicated: Secondary | ICD-10-CM

## 2015-01-31 DIAGNOSIS — F319 Bipolar disorder, unspecified: Secondary | ICD-10-CM

## 2015-01-31 MED ORDER — DISULFIRAM 250 MG PO TABS: 250.0000 mg | ORAL_TABLET | Freq: Every day | ORAL | Status: DC

## 2015-01-31 MED ORDER — HALOPERIDOL 10 MG PO TABS: 30.0000 mg | ORAL_TABLET | Freq: Every day | ORAL | Status: DC

## 2015-01-31 MED ORDER — BENZTROPINE MESYLATE 2 MG PO TABS: 2.0000 mg | ORAL_TABLET | Freq: Every day | ORAL | Status: DC

## 2015-01-31 MED ORDER — FLUOXETINE HCL 40 MG PO CAPS: 40.0000 mg | ORAL_CAPSULE | Freq: Every day | ORAL | Status: DC

## 2015-01-31 MED ORDER — ALPRAZOLAM 1 MG PO TABS: 1.0000 mg | ORAL_TABLET | Freq: Three times a day (TID) | ORAL | Status: DC | PRN
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
## 2015-02-04 ENCOUNTER — Other Ambulatory Visit: Payer: Self-pay | Admitting: Family Medicine

## 2015-02-04 ENCOUNTER — Telehealth: Payer: Self-pay | Admitting: Family Medicine

## 2015-02-04 NOTE — Telephone Encounter (Signed)
Patient is stating that the pharmacy has faxed over Prescription refills and hes making sure we respond before closing, also he wants Dr. Moshe Cipro to know his blood pressure has been currently running about 117/78 he says thanks for the great work!!

## 2015-02-04 NOTE — Telephone Encounter (Signed)
meds refilled 

## 2015-02-07 ENCOUNTER — Telehealth: Payer: Self-pay | Admitting: Family Medicine

## 2015-02-07 NOTE — Telephone Encounter (Signed)
Wants to get his rx on Friday because he's going out of town this weekend for a week

## 2015-02-09 NOTE — Telephone Encounter (Signed)
Patient is aware that Rx will be ready on Friday

## 2015-02-10 ENCOUNTER — Other Ambulatory Visit: Payer: Self-pay

## 2015-02-10 MED ORDER — OXYCODONE-ACETAMINOPHEN 10-325 MG PO TABS: 1.0000 | ORAL_TABLET | Freq: Four times a day (QID) | ORAL | Status: DC | PRN

## 2015-02-11 ENCOUNTER — Ambulatory Visit (HOSPITAL_COMMUNITY): Payer: Self-pay | Admitting: Psychiatry

## 2015-02-17 DIAGNOSIS — J449 Chronic obstructive pulmonary disease, unspecified: Secondary | ICD-10-CM | POA: Diagnosis not present

## 2015-02-23 ENCOUNTER — Other Ambulatory Visit: Payer: Self-pay | Admitting: Family Medicine

## 2015-03-07 ENCOUNTER — Telehealth: Payer: Self-pay | Admitting: Family Medicine

## 2015-03-07 NOTE — Telephone Encounter (Signed)
Patient is asking if he can pick his pain medication up Friday, please advise?

## 2015-03-09 NOTE — Telephone Encounter (Signed)
Left message that it would not be available until Monday

## 2015-03-11 ENCOUNTER — Other Ambulatory Visit: Payer: Self-pay

## 2015-03-11 MED ORDER — OXYCODONE-ACETAMINOPHEN 10-325 MG PO TABS: 1.0000 | ORAL_TABLET | Freq: Four times a day (QID) | ORAL | Status: DC | PRN

## 2015-03-20 DIAGNOSIS — J449 Chronic obstructive pulmonary disease, unspecified: Secondary | ICD-10-CM | POA: Diagnosis not present

## 2015-03-21 ENCOUNTER — Ambulatory Visit: Payer: Self-pay | Admitting: Family Medicine

## 2015-03-22 ENCOUNTER — Other Ambulatory Visit: Payer: Self-pay | Admitting: Family Medicine

## 2015-03-29 ENCOUNTER — Ambulatory Visit: Payer: Self-pay | Admitting: Family Medicine

## 2015-03-30 ENCOUNTER — Other Ambulatory Visit: Payer: Self-pay | Admitting: Family Medicine

## 2015-03-30 ENCOUNTER — Other Ambulatory Visit (HOSPITAL_COMMUNITY): Payer: Self-pay | Admitting: Psychiatry

## 2015-03-31 ENCOUNTER — Encounter: Payer: Self-pay | Admitting: Family Medicine

## 2015-03-31 ENCOUNTER — Other Ambulatory Visit: Payer: Self-pay

## 2015-03-31 ENCOUNTER — Ambulatory Visit (INDEPENDENT_AMBULATORY_CARE_PROVIDER_SITE_OTHER): Payer: Medicare Other | Admitting: Family Medicine

## 2015-03-31 VITALS — BP 120/88 | HR 96 | Resp 16 | Ht 73.0 in | Wt 229.0 lb

## 2015-03-31 DIAGNOSIS — E785 Hyperlipidemia, unspecified: Secondary | ICD-10-CM | POA: Diagnosis not present

## 2015-03-31 DIAGNOSIS — F17200 Nicotine dependence, unspecified, uncomplicated: Secondary | ICD-10-CM

## 2015-03-31 DIAGNOSIS — M1710 Unilateral primary osteoarthritis, unspecified knee: Secondary | ICD-10-CM

## 2015-03-31 DIAGNOSIS — M25531 Pain in right wrist: Secondary | ICD-10-CM

## 2015-03-31 DIAGNOSIS — Z1159 Encounter for screening for other viral diseases: Secondary | ICD-10-CM

## 2015-03-31 DIAGNOSIS — Z125 Encounter for screening for malignant neoplasm of prostate: Secondary | ICD-10-CM | POA: Diagnosis not present

## 2015-03-31 DIAGNOSIS — K219 Gastro-esophageal reflux disease without esophagitis: Secondary | ICD-10-CM

## 2015-03-31 DIAGNOSIS — I10 Essential (primary) hypertension: Secondary | ICD-10-CM

## 2015-03-31 DIAGNOSIS — R7303 Prediabetes: Secondary | ICD-10-CM | POA: Diagnosis not present

## 2015-03-31 DIAGNOSIS — F3170 Bipolar disorder, currently in remission, most recent episode unspecified: Secondary | ICD-10-CM

## 2015-03-31 DIAGNOSIS — J449 Chronic obstructive pulmonary disease, unspecified: Secondary | ICD-10-CM

## 2015-03-31 DIAGNOSIS — D473 Essential (hemorrhagic) thrombocythemia: Secondary | ICD-10-CM

## 2015-03-31 DIAGNOSIS — M25561 Pain in right knee: Secondary | ICD-10-CM

## 2015-03-31 DIAGNOSIS — M129 Arthropathy, unspecified: Secondary | ICD-10-CM

## 2015-03-31 DIAGNOSIS — D75839 Thrombocytosis, unspecified: Secondary | ICD-10-CM

## 2015-03-31 MED ORDER — MONTELUKAST SODIUM 10 MG PO TABS: 10.0000 mg | ORAL_TABLET | Freq: Every day | ORAL | Status: DC

## 2015-03-31 MED ORDER — PREDNISONE 5 MG PO TABS: 5.0000 mg | ORAL_TABLET | Freq: Two times a day (BID) | ORAL | Status: DC

## 2015-03-31 MED ORDER — OXYCODONE-ACETAMINOPHEN 10-325 MG PO TABS: 1.0000 | ORAL_TABLET | Freq: Four times a day (QID) | ORAL | Status: DC | PRN

## 2015-03-31 NOTE — Patient Instructions (Addendum)
Annual wellness in 4 month, call if you need me before  Labs today    5 day course of prednisone sent for right wrist and right knee pain and you are referred to Dr Luna Glasgow also   [place] on cigarettes start at 9 pr day and reduce by 1 evry 2 weeks as we discussed, need to quit  Glad that counseling sessions are helping!  Thanks for choosing [facility], we consider it a privelige to serve you.  All the best

## 2015-03-31 NOTE — Progress Notes (Signed)
Subjective:    Patient ID: Gavin Gray, male    DOB: February 05, 1966, 50 y.o.   MRN: [id number]  HPI   Gavin Gray     MRN: [id number]      DOB: [birth year]   HPI Gavin Gray is here for follow up and re-evaluation of chronic medical conditions, medication management and review of any available recent lab and radiology data.  Preventive health is updated, specifically  Cancer screening and Immunization.   Questions or concerns regarding consultations or procedures which the PT has had in the interim are  addressed. The PT denies any adverse reactions to current medications since the last visit.  Increased and uncontrolled right wrist and knee pain wants help  ROS Denies recent fever or chills. Denies sinus pressure, nasal congestion, ear pain or sore throat. Denies chest congestion, productive cough or wheezing. Denies chest pains, palpitations and leg swelling Denies abdominal pain, nausea, vomiting,diarrhea or constipation.   Denies dysuria, frequency, hesitancy or incontinence.  Denies headaches, seizures, numbness, or tingling. Denies uncontrolled  depression, anxiety or insomnia. Denies skin break down or rash.   PE  BP 120/88 mmHg  Pulse 96  Resp 16  Ht 6\' 1"  (1.854 m)  Wt 229 lb (103.874 kg)  BMI 30.22 kg/m2  SpO2 97%  Patient alert and oriented and in no cardiopulmonary distress.  HEENT: No facial asymmetry, EOMI,   oropharynx pink and moist.  Neck supple no JVD, no mass.  Chest: Clear to auscultation bilaterally.decreased air entry  CVS: S1, S2 no murmurs, no S3.Regular rate.  ABD: Soft non tender.   Ext: No edema  [id number]  ROM spine,  Right  Knee and Decrreased right wrist mobility  Skin: Intact, no ulcerations or rash noted.  Psych: Good eye contact, normal affect. Memory intact not anxious or depressed appearing.  CNS: CN 2-12 intact, power,  normal throughout.no focal deficits noted.   Assessment & Plan   Essential  hypertension Controlled, no change in medication DASH diet and commitment to daily physical activity for a minimum of 30 minutes discussed and encouraged, as a part of hypertension management. The importance of attaining a healthy weight is also discussed.  BP/Weight 03/31/2015 01/27/2015 12/22/2014 11/23/2014 11/17/2014 08/13/2014 123XX123  Systolic BP 123456 98 0000000 [id number] Q000111Q [id number]  Diastolic BP 88 62 [id number] 96 98 84 66  Wt. (Lbs) 229 - - 238.8 220 230 217.04  BMI 30.22 - - 31.51 29.03 30.35 28.64  Some encounter information is confidential and restricted. Go to Review Flowsheets activity to see all data.        COPD, moderate Symptoms controlled on meds, needs to quiot smoking to prevent further progression  GERD (gastroesophageal reflux disease) Controlled, no change in medication   Arthritis of knee Current flare, 5 day course of prednisone and referred to ortho  Right wrist pain No recent trauma, limited mobility due to pain, ortho to eval and treat  TOBACCO ABUSE Patient counseled for approximately 5 minutes regarding the health risks of ongoing nicotine use, specifically all types of cancer, heart disease, stroke and respiratory failure. The options available for help with cessation ,the behavioral changes to assist the process, and the option to either gradully reduce usage  Or abruptly stop.is also discussed. Pt is also encouraged to set specific goals in number of cigarettes used daily, as well as to set a quit date.  Number of cigarettes/cigars currently smoking daily: 10   Hyperlipemia Controlled, no change in medication  Bipolar disorder Improved, now seeing therapist to deal with grief and loss which is helping a lot. Followed closely by psychiatry. Bout of intoxication last Fall was a "wake uo call " for him to be more pro active in dealing with Mother's passing  Thrombocytosis ([place]) Platelet count at 551 and has been elevated on more than one occasion, normal Hb  and WBC. Will refer to hematolgy  Prediabetes Normalized with change in diet and weight loss which is excellant         Review of Systems     Objective:   Physical Exam        Assessment & Plan:

## 2015-04-04 ENCOUNTER — Other Ambulatory Visit: Payer: Self-pay | Admitting: Family Medicine

## 2015-04-04 ENCOUNTER — Telehealth: Payer: Self-pay | Admitting: Family Medicine

## 2015-04-04 DIAGNOSIS — D691 Qualitative platelet defects: Secondary | ICD-10-CM

## 2015-04-04 DIAGNOSIS — D759 Disease of blood and blood-forming organs, unspecified: Secondary | ICD-10-CM

## 2015-04-04 NOTE — Telephone Encounter (Addendum)
Pls let him know that excellent labs except low vit D 7.2 Please  advise vit D is low, and erx Vit D3 50,000 IU once weekly #4 refill 11, and let pt know .   Also platelet count high, 551, upper norm is 379, rest of bone marrow test normal  I am referring him to hematology for eval has been high in the past also  Refer for elevated platelet count , (thrombocytosis) if he agrees, explain this is in the cancer center BUT he is not going there with a diagnosis of cancer,  But because blood work is abnormal  ??? pls ask!

## 2015-04-05 ENCOUNTER — Telehealth: Payer: Self-pay | Admitting: Family Medicine

## 2015-04-05 NOTE — Telephone Encounter (Signed)
Patient is asking to speak to the nurse, please advise?

## 2015-04-05 NOTE — Telephone Encounter (Signed)
Called patient and left message for them to return call at the office   

## 2015-04-05 NOTE — Telephone Encounter (Signed)
See previous message, trying to call patient with lab results

## 2015-04-06 MED ORDER — VITAMIN D (ERGOCALCIFEROL) 1.25 MG (50000 UNIT) PO CAPS: 50000.0000 [IU] | ORAL_CAPSULE | ORAL | Status: DC

## 2015-04-06 NOTE — Addendum Note (Signed)
Addended by: Eual Fines on: 04/06/2015 10:02 AM   Modules accepted: Orders

## 2015-04-06 NOTE — Telephone Encounter (Signed)
Called patient and left message for them to return call at the office   

## 2015-04-06 NOTE — Addendum Note (Signed)
Addended by: Eual Fines on: 04/06/2015 10:06 AM   Modules accepted: Orders

## 2015-04-06 NOTE — Telephone Encounter (Signed)
Pt aware med sent and pt referred

## 2015-04-11 ENCOUNTER — Encounter: Payer: Self-pay | Admitting: Family Medicine

## 2015-04-11 DIAGNOSIS — D75839 Thrombocytosis, unspecified: Secondary | ICD-10-CM | POA: Insufficient documentation

## 2015-04-11 DIAGNOSIS — M25531 Pain in right wrist: Secondary | ICD-10-CM | POA: Insufficient documentation

## 2015-04-11 DIAGNOSIS — D473 Essential (hemorrhagic) thrombocythemia: Secondary | ICD-10-CM | POA: Insufficient documentation

## 2015-04-11 NOTE — Assessment & Plan Note (Signed)
Current flare, 5 day course of prednisone and referred to ortho

## 2015-04-11 NOTE — Assessment & Plan Note (Signed)
Normalized with change in diet and weight loss which is excellant

## 2015-04-11 NOTE — Assessment & Plan Note (Signed)
Symptoms controlled on meds, needs to quiot smoking to prevent further progression

## 2015-04-11 NOTE — Assessment & Plan Note (Signed)
Controlled, no change in medication  

## 2015-04-11 NOTE — Assessment & Plan Note (Signed)
No recent trauma, limited mobility due to pain, ortho to eval and treat

## 2015-04-11 NOTE — Assessment & Plan Note (Signed)
Platelet count at 551 and has been elevated on more than one occasion, normal Hb and WBC. Will refer to hematolgy

## 2015-04-11 NOTE — Assessment & Plan Note (Signed)
Controlled, no change in medication DASH diet and commitment to daily physical activity for a minimum of 30 minutes discussed and encouraged, as a part of hypertension management. The importance of attaining a healthy weight is also discussed.  BP/Weight 03/31/2015 01/27/2015 12/22/2014 11/23/2014 11/17/2014 08/13/2014 123XX123  Systolic BP 123456 98 0000000 Q000111Q Q000111Q [id number]  Diastolic BP 88 62 [id number] 96 98 84 66  Wt. (Lbs) 229 - - 238.8 220 230 217.04  BMI 30.22 - - 31.51 29.03 30.35 28.64  Some encounter information is confidential and restricted. Go to Review Flowsheets activity to see all data.

## 2015-04-11 NOTE — Assessment & Plan Note (Signed)

## 2015-04-11 NOTE — Assessment & Plan Note (Signed)
Improved, now seeing therapist to deal with grief and loss which is helping a lot. Followed closely by psychiatry. Bout of intoxication last Fall was a "wake uo call " for him to be more pro active in dealing with Mother's passing

## 2015-04-20 DIAGNOSIS — J449 Chronic obstructive pulmonary disease, unspecified: Secondary | ICD-10-CM | POA: Diagnosis not present

## 2015-04-21 ENCOUNTER — Ambulatory Visit: Payer: Self-pay | Admitting: Orthopaedic Surgery

## 2015-04-21 ENCOUNTER — Encounter: Payer: Self-pay | Admitting: Orthopaedic Surgery

## 2015-04-22 ENCOUNTER — Ambulatory Visit (HOSPITAL_COMMUNITY): Payer: Self-pay | Admitting: Hematology & Oncology

## 2015-04-29 ENCOUNTER — Other Ambulatory Visit: Payer: Self-pay

## 2015-04-29 MED ORDER — OXYCODONE-ACETAMINOPHEN 10-325 MG PO TABS: 1.0000 | ORAL_TABLET | Freq: Four times a day (QID) | ORAL | Status: DC | PRN

## 2015-05-01 NOTE — Progress Notes (Signed)
This encounter was created in error - please disregard.

## 2015-05-02 ENCOUNTER — Ambulatory Visit (HOSPITAL_COMMUNITY): Payer: Self-pay | Admitting: Psychiatry

## 2015-05-03 ENCOUNTER — Other Ambulatory Visit (HOSPITAL_COMMUNITY): Payer: Self-pay | Admitting: Psychiatry

## 2015-05-03 ENCOUNTER — Other Ambulatory Visit: Payer: Self-pay | Admitting: Family Medicine

## 2015-05-04 ENCOUNTER — Other Ambulatory Visit (HOSPITAL_COMMUNITY): Payer: Self-pay

## 2015-05-04 DIAGNOSIS — F319 Bipolar disorder, unspecified: Secondary | ICD-10-CM

## 2015-05-04 DIAGNOSIS — F1010 Alcohol abuse, uncomplicated: Secondary | ICD-10-CM

## 2015-05-04 NOTE — Telephone Encounter (Signed)
Fax received for a refill on Alprazolam, patient was last here on 12/5 and had a f/u on 3/6 but did not come. I called the patient and left a voicemail for him to call back and make a follow up appointment. Okay to refill? Please advise, thank you

## 2015-05-05 MED ORDER — BENZTROPINE MESYLATE 2 MG PO TABS: 2.0000 mg | ORAL_TABLET | Freq: Every day | ORAL | Status: DC

## 2015-05-05 MED ORDER — DISULFIRAM 250 MG PO TABS: 250.0000 mg | ORAL_TABLET | Freq: Every day | ORAL | Status: DC

## 2015-05-05 MED ORDER — FLUOXETINE HCL 40 MG PO CAPS: 40.0000 mg | ORAL_CAPSULE | Freq: Every day | ORAL | Status: DC

## 2015-05-05 MED ORDER — HALOPERIDOL 10 MG PO TABS: 30.0000 mg | ORAL_TABLET | Freq: Every day | ORAL | Status: DC

## 2015-05-05 MED ORDER — ALPRAZOLAM 1 MG PO TABS: 1.0000 mg | ORAL_TABLET | Freq: Three times a day (TID) | ORAL | Status: DC | PRN

## 2015-05-05 NOTE — Telephone Encounter (Signed)
I spoke to Dr. Adele Schilder this morning and he approved refills for 1 month, I sent all refills to the pharmacy and called patient to let him know this had been done

## 2015-05-09 ENCOUNTER — Ambulatory Visit (HOSPITAL_COMMUNITY): Payer: Self-pay | Admitting: Hematology & Oncology

## 2015-05-09 NOTE — Progress Notes (Signed)
This encounter was created in error - please disregard.

## 2015-05-09 NOTE — Patient Instructions (Signed)
[facility] at [facility] Discharge Instructions  RECOMMENDATIONS MADE BY THE CONSULTANT AND ANY TEST RESULTS WILL BE SENT TO YOUR REFERRING PHYSICIAN.   Exam and discussion by Dr Penland today  Return to see the doctor in Please call the clinic if you have any questions or concerns    Thank you for choosing [facility] at [facility] to provide your oncology and hematology care.  To afford each patient quality time with our provider, please arrive at least 15 minutes before your scheduled appointment time.   Beginning January 23rd 2017 lab work for the Cancer Center will be done in the  Main lab at [place] on 1st floor. If you have a lab appointment with the Cancer Center please come in thru the  Main Entrance and check in at the main information desk  You need to re-schedule your appointment should you arrive 10 or more minutes late.  We strive to give you quality time with our providers, and arriving late affects you and other patients whose appointments are after yours.  Also, if you no show three or more times for appointments you may be dismissed from the clinic at the providers discretion.     Again, thank you for choosing [facility].  Our hope is that these requests will decrease the amount of time that you wait before being seen by our physicians.       _____________________________________________________________  Should you have questions after your visit to [facility], please contact our office at [phone] between the hours of 8:30 a.m. and 4:30 p.m.  Voicemails left after 4:30 p.m. will not be returned until the following business day.  For prescription refill requests, have your pharmacy contact our office.         Resources For Cancer Patients and their Caregivers ? American Cancer Society: Can assist with transportation, wigs, general needs, runs Look Good Feel Better.         1-888-227-6333 ? Cancer Care: Provides financial assistance, online support groups, medication/co-pay assistance.  1-800-813-HOPE (4673) ? [facility] Assists [place] cancer patients and their families through emotional , educational and financial support.  336-427-4357 ? [place] DSS Where to apply for food stamps, Medicaid and utility assistance. 336-342-1394 ? [facility]: Transportation to medical appointments. 336-347-2287 ? Social Security Administration: May apply for disability if have a Stage IV cancer. 336-342-7796 1-800-772-1213 ? [place] Aging, Disability and Transit Services: Assists with nutrition, care and transit needs. 336-349-2343  

## 2015-05-11 ENCOUNTER — Ambulatory Visit (INDEPENDENT_AMBULATORY_CARE_PROVIDER_SITE_OTHER): Payer: 59 | Admitting: Psychiatry

## 2015-05-11 ENCOUNTER — Encounter (HOSPITAL_COMMUNITY): Payer: Self-pay | Admitting: Psychiatry

## 2015-05-11 VITALS — BP 124/85 | HR 71 | Ht 73.0 in | Wt 231.0 lb

## 2015-05-11 DIAGNOSIS — F1010 Alcohol abuse, uncomplicated: Secondary | ICD-10-CM

## 2015-05-11 DIAGNOSIS — F319 Bipolar disorder, unspecified: Secondary | ICD-10-CM | POA: Diagnosis not present

## 2015-05-11 MED ORDER — FLUOXETINE HCL 40 MG PO CAPS: 40.0000 mg | ORAL_CAPSULE | Freq: Every day | ORAL | Status: DC

## 2015-05-11 MED ORDER — BENZTROPINE MESYLATE 2 MG PO TABS: 2.0000 mg | ORAL_TABLET | Freq: Every day | ORAL | Status: DC

## 2015-05-11 MED ORDER — ALPRAZOLAM 1 MG PO TABS: 1.0000 mg | ORAL_TABLET | Freq: Three times a day (TID) | ORAL | Status: DC | PRN

## 2015-05-11 MED ORDER — HALOPERIDOL 10 MG PO TABS: 30.0000 mg | ORAL_TABLET | Freq: Every day | ORAL | Status: DC
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
## 2015-05-16 ENCOUNTER — Telehealth (HOSPITAL_COMMUNITY): Payer: Self-pay
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
## 2015-05-18 DIAGNOSIS — J449 Chronic obstructive pulmonary disease, unspecified: Secondary | ICD-10-CM | POA: Diagnosis not present

## 2015-05-27 ENCOUNTER — Other Ambulatory Visit: Payer: Self-pay

## 2015-05-27 MED ORDER — OXYCODONE-ACETAMINOPHEN 10-325 MG PO TABS: 1.0000 | ORAL_TABLET | Freq: Four times a day (QID) | ORAL | Status: DC | PRN

## 2015-06-06 ENCOUNTER — Telehealth: Payer: Self-pay | Admitting: Family Medicine

## 2015-06-06 ENCOUNTER — Emergency Department (HOSPITAL_COMMUNITY): Payer: Medicare Other

## 2015-06-06 ENCOUNTER — Emergency Department (HOSPITAL_COMMUNITY)
Admission: EM | Admit: 2015-06-06 | Discharge: 2015-06-06 | Disposition: A | Discharge status: Home / Self Care | Payer: Medicare Other | Attending: Emergency Medicine | Admitting: Emergency Medicine

## 2015-06-06 ENCOUNTER — Encounter (HOSPITAL_COMMUNITY): Payer: Self-pay | Admitting: *Deleted

## 2015-06-06 DIAGNOSIS — E785 Hyperlipidemia, unspecified: Secondary | ICD-10-CM | POA: Diagnosis not present

## 2015-06-06 DIAGNOSIS — J441 Chronic obstructive pulmonary disease with (acute) exacerbation: Secondary | ICD-10-CM | POA: Insufficient documentation

## 2015-06-06 DIAGNOSIS — I10 Essential (primary) hypertension: Secondary | ICD-10-CM | POA: Diagnosis not present

## 2015-06-06 DIAGNOSIS — R05 Cough: Secondary | ICD-10-CM | POA: Diagnosis not present

## 2015-06-06 DIAGNOSIS — Z79899 Other long term (current) drug therapy: Secondary | ICD-10-CM | POA: Diagnosis not present

## 2015-06-06 DIAGNOSIS — F209 Schizophrenia, unspecified: Secondary | ICD-10-CM | POA: Diagnosis not present

## 2015-06-06 DIAGNOSIS — E119 Type 2 diabetes mellitus without complications: Secondary | ICD-10-CM | POA: Diagnosis not present

## 2015-06-06 MED ORDER — PREDNISONE 10 MG PO TABS: 20.0000 mg | ORAL_TABLET | Freq: Two times a day (BID) | ORAL | Status: DC

## 2015-06-06 MED ORDER — ALBUTEROL SULFATE (2.5 MG/3ML) 0.083% IN NEBU
5.0000 mg | INHALATION_SOLUTION | Freq: Once | RESPIRATORY_TRACT | Status: AC
  Administered 2015-06-06: 5 mg via RESPIRATORY_TRACT
  Filled 2015-06-06: qty 6

## 2015-06-06 MED ORDER — AZITHROMYCIN 250 MG PO TABS: ORAL_TABLET | ORAL | Status: DC

## 2015-06-06 MED ORDER — ALBUTEROL SULFATE (2.5 MG/3ML) 0.083% IN NEBU: 2.5000 mg | INHALATION_SOLUTION | RESPIRATORY_TRACT | Status: AC | PRN

## 2015-06-06 MED ORDER — PREDNISONE 20 MG PO TABS
40.0000 mg | ORAL_TABLET | Freq: Once | ORAL | Status: AC
  Administered 2015-06-06: 40 mg via ORAL
  Filled 2015-06-06: qty 2
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
## 2015-06-06 NOTE — Discharge Instructions (Signed)
Zithromax and prednisone as prescribed.  Albuterol nebulizer treatment every 4 hours as needed for cough or wheezing.  Return to the ER if symptoms significantly worsen or change.   Chronic Obstructive Pulmonary Disease Exacerbation Chronic obstructive pulmonary disease (COPD) is a common lung condition in which airflow from the lungs is limited. COPD is a general term that can be used to describe many different lung problems that limit airflow, including chronic bronchitis and emphysema. COPD exacerbations are episodes when breathing symptoms become much worse and require extra treatment. Without treatment, COPD exacerbations can be life threatening, and frequent COPD exacerbations can cause further damage to your lungs. CAUSES  Respiratory infections.  Exposure to smoke.  Exposure to air pollution, chemical fumes, or dust. Sometimes there is no apparent cause or trigger. RISK FACTORS  Smoking cigarettes.  Older age.  Frequent prior COPD exacerbations. SIGNS AND SYMPTOMS  Increased coughing.  Increased thick spit (sputum) production.  Increased wheezing.  Increased shortness of breath.  Rapid breathing.  Chest tightness. DIAGNOSIS Your medical history, a physical exam, and tests will help your health care provider make a diagnosis. Tests may include:  A chest X-ray.  Basic lab tests.  Sputum testing.  An arterial blood gas test. TREATMENT Depending on the severity of your COPD exacerbation, you may need to be admitted to a hospital for treatment. Some of the treatments commonly used to treat COPD exacerbations are:   Antibiotic medicines.  Bronchodilators. These are drugs that expand the air passages. They may be given with an inhaler or nebulizer. Spacer devices may be needed to help improve drug delivery.  Corticosteroid medicines.  Supplemental oxygen therapy.  Airway clearing techniques, such as noninvasive ventilation (NIV) and positive expiratory  pressure (PEP). These provide respiratory support through a mask or other noninvasive device. HOME CARE INSTRUCTIONS  Do not smoke. Quitting smoking is very important to prevent COPD from getting worse and exacerbations from happening as often.  Avoid exposure to all substances that irritate the airway, especially to tobacco smoke.  If you were prescribed an antibiotic medicine, finish it all even if you start to feel better.  Take all medicines as directed by your health care provider.It is important to use correct technique with inhaled medicines.  Drink enough fluids to keep your urine clear or pale yellow (unless you have a medical condition that requires fluid restriction).  Use a cool mist vaporizer. This makes it easier to clear your chest when you cough.  If you have a home nebulizer and oxygen, continue to use them as directed.  Maintain all necessary vaccinations to prevent infections.  Exercise regularly.  Eat a healthy diet.  Keep all follow-up appointments as directed by your health care provider. SEEK IMMEDIATE MEDICAL CARE IF:  You have worsening shortness of breath.  You have trouble talking.  You have severe chest pain.  You have blood in your sputum.  You have a fever.  You have weakness, vomit repeatedly, or faint.  You feel confused.  You continue to get worse. MAKE SURE YOU:  Understand these instructions.  Will watch your condition.  Will get help right away if you are not doing well or get worse.   This information is not intended to replace advice given to you by your health care provider. Make sure you discuss any questions you have with your health care provider.   Document Released: 12/10/2006 Document Revised: 03/05/2014 Document Reviewed: 10/17/2012 Elsevier Interactive Patient Education [employer].
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
## 2015-06-06 NOTE — ED Notes (Signed)
Pt was sent here by PCP for eval. Pt states he has been having a productive cough and bilateral hand tingling. Pt is anxious in triage. Pt states he is having come chest tightness.

## 2015-06-07 ENCOUNTER — Telehealth: Payer: Self-pay | Admitting: Family Medicine

## 2015-06-07 MED ORDER — ESOMEPRAZOLE MAGNESIUM 40 MG PO CPDR: DELAYED_RELEASE_CAPSULE | ORAL | Status: DC

## 2015-06-07 MED ORDER — GABAPENTIN 400 MG PO CAPS: ORAL_CAPSULE | ORAL | Status: DC

## 2015-06-07 NOTE — Telephone Encounter (Signed)
meds refilled 

## 2015-06-07 NOTE — Telephone Encounter (Signed)
Patient is asking for a refills on medications gabapentin (NEURONTIN) 400 MG capsule and esomeprazole (NEXIUM) 40 MG capsule, patient states he is feeling much better after the ER visit yesterday

## 2015-06-08 ENCOUNTER — Other Ambulatory Visit: Payer: Self-pay

## 2015-06-08 MED ORDER — ESOMEPRAZOLE MAGNESIUM 40 MG PO CPDR: DELAYED_RELEASE_CAPSULE | ORAL | Status: DC

## 2015-06-08 MED ORDER — GABAPENTIN 400 MG PO CAPS: ORAL_CAPSULE | ORAL | Status: DC

## 2015-06-08 NOTE — Telephone Encounter (Signed)
Medications sent to requested pharmacy

## 2015-06-08 NOTE — Telephone Encounter (Signed)
Patient is asking that his Gabapentin and Nexium be sent to [facility] in [place], please advise?

## 2015-06-13 ENCOUNTER — Telehealth (HOSPITAL_COMMUNITY): Payer: Self-pay
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
## 2015-06-14 NOTE — Telephone Encounter (Signed)
I tried to call back the number patient left and I just get a fast busy signal, the other number in the chart was working and I left a message on it for patient to call me back.

## 2015-06-14 NOTE — Telephone Encounter (Signed)
He knows. No early refill benzodiazepine.

## 2015-06-18 DIAGNOSIS — J449 Chronic obstructive pulmonary disease, unspecified: Secondary | ICD-10-CM | POA: Diagnosis not present

## 2015-06-22 ENCOUNTER — Telehealth: Payer: Self-pay | Admitting: Family Medicine

## 2015-06-22 ENCOUNTER — Other Ambulatory Visit: Payer: Self-pay

## 2015-06-22 MED ORDER — TOPIRAMATE 25 MG PO TABS: ORAL_TABLET | ORAL | Status: DC

## 2015-06-22 NOTE — Telephone Encounter (Signed)
Patient returned call and spoke to [facility]

## 2015-06-22 NOTE — Telephone Encounter (Signed)
Patient is asking for a returned call from either [place] or [place], please advise?

## 2015-06-22 NOTE — Telephone Encounter (Signed)
Called back number left twice and it was an error message. Wrong number provided. Will speak with patient if he calls back

## 2015-06-23 ENCOUNTER — Other Ambulatory Visit: Payer: Self-pay

## 2015-06-23 MED ORDER — OXYCODONE-ACETAMINOPHEN 10-325 MG PO TABS: 1.0000 | ORAL_TABLET | Freq: Four times a day (QID) | ORAL | Status: DC | PRN

## 2015-06-29 ENCOUNTER — Other Ambulatory Visit: Payer: Self-pay

## 2015-06-29 MED ORDER — AMLODIPINE BESY-BENAZEPRIL HCL 10-20 MG PO CAPS: ORAL_CAPSULE | ORAL | Status: DC

## 2015-07-01 ENCOUNTER — Other Ambulatory Visit: Payer: Self-pay | Admitting: Family Medicine

## 2015-07-18 DIAGNOSIS — J449 Chronic obstructive pulmonary disease, unspecified: Secondary | ICD-10-CM | POA: Diagnosis not present

## 2015-07-22 ENCOUNTER — Other Ambulatory Visit: Payer: Self-pay

## 2015-07-22 MED ORDER — OXYCODONE-ACETAMINOPHEN 10-325 MG PO TABS: 1.0000 | ORAL_TABLET | Freq: Four times a day (QID) | ORAL | Status: DC | PRN

## 2015-07-26 ENCOUNTER — Telehealth: Payer: Self-pay | Admitting: Family Medicine

## 2015-07-26 NOTE — Telephone Encounter (Signed)
Patient is asking for a returned call from the nurse regarding refills on his medications no refills, his appointment is not until 08/18/15 with Dr. Moshe Cipro

## 2015-07-26 NOTE — Telephone Encounter (Signed)
Patient aware that he can collect rx

## 2015-08-01 ENCOUNTER — Encounter: Payer: Self-pay | Admitting: Family Medicine

## 2015-08-01 ENCOUNTER — Other Ambulatory Visit: Payer: Self-pay | Admitting: Family Medicine

## 2015-08-05 ENCOUNTER — Other Ambulatory Visit: Payer: Self-pay | Admitting: Family Medicine

## 2015-08-11 ENCOUNTER — Ambulatory Visit (HOSPITAL_COMMUNITY): Payer: Self-pay | Admitting: Psychiatry

## 2015-08-18 ENCOUNTER — Encounter: Payer: Self-pay | Admitting: Family Medicine

## 2015-08-18 DIAGNOSIS — J449 Chronic obstructive pulmonary disease, unspecified: Secondary | ICD-10-CM | POA: Diagnosis not present

## 2015-08-19 ENCOUNTER — Other Ambulatory Visit: Payer: Self-pay

## 2015-08-19 MED ORDER — OXYCODONE-ACETAMINOPHEN 10-325 MG PO TABS: 1.0000 | ORAL_TABLET | Freq: Four times a day (QID) | ORAL | Status: DC | PRN

## 2015-08-31 ENCOUNTER — Encounter: Payer: Self-pay | Admitting: Family Medicine

## 2015-09-05 ENCOUNTER — Other Ambulatory Visit: Payer: Self-pay | Admitting: Family Medicine

## 2015-09-05 ENCOUNTER — Telehealth: Payer: Self-pay | Admitting: Family Medicine

## 2015-09-05 DIAGNOSIS — F319 Bipolar disorder, unspecified: Secondary | ICD-10-CM

## 2015-09-05 DIAGNOSIS — J449 Chronic obstructive pulmonary disease, unspecified: Secondary | ICD-10-CM

## 2015-09-05 MED ORDER — ESOMEPRAZOLE MAGNESIUM 40 MG PO CPDR: DELAYED_RELEASE_CAPSULE | ORAL | Status: DC

## 2015-09-05 MED ORDER — FLUOXETINE HCL 40 MG PO CAPS: 40.0000 mg | ORAL_CAPSULE | Freq: Every day | ORAL | Status: DC

## 2015-09-05 MED ORDER — RIZATRIPTAN BENZOATE 10 MG PO TBDP
ORAL_TABLET | ORAL | Status: AC
Start: 2015-09-05 — End: ?

## 2015-09-05 MED ORDER — RANITIDINE HCL 150 MG PO TABS: 150.0000 mg | ORAL_TABLET | Freq: Two times a day (BID) | ORAL | Status: DC

## 2015-09-05 MED ORDER — GABAPENTIN 400 MG PO CAPS: ORAL_CAPSULE | ORAL | Status: DC

## 2015-09-05 MED ORDER — MONTELUKAST SODIUM 10 MG PO TABS: 10.0000 mg | ORAL_TABLET | Freq: Every day | ORAL | Status: DC

## 2015-09-05 MED ORDER — DICLOFENAC SODIUM 75 MG PO TBEC: 75.0000 mg | DELAYED_RELEASE_TABLET | Freq: Two times a day (BID) | ORAL | Status: DC

## 2015-09-05 MED ORDER — LORATADINE 10 MG PO TABS: ORAL_TABLET | ORAL | Status: AC

## 2015-09-05 MED ORDER — AMLODIPINE BESY-BENAZEPRIL HCL 10-20 MG PO CAPS: 1.0000 | ORAL_CAPSULE | Freq: Every day | ORAL | Status: DC

## 2015-09-05 MED ORDER — BUDESONIDE-FORMOTEROL FUMARATE 160-4.5 MCG/ACT IN AERO
2.0000 | INHALATION_SPRAY | Freq: Two times a day (BID) | RESPIRATORY_TRACT | Status: DC
Start: 2015-09-05 — End: 2015-09-05

## 2015-09-05 MED ORDER — TOPIRAMATE 25 MG PO TABS: ORAL_TABLET | ORAL | Status: DC

## 2015-09-05 NOTE — Telephone Encounter (Signed)
1 refill only 

## 2015-09-05 NOTE — Telephone Encounter (Signed)
Patient is asking for a refill on all meds until he is seen, please advise?

## 2015-09-13 ENCOUNTER — Other Ambulatory Visit: Payer: Self-pay

## 2015-09-13 MED ORDER — OXYCODONE-ACETAMINOPHEN 10-325 MG PO TABS: 1.0000 | ORAL_TABLET | Freq: Four times a day (QID) | ORAL | Status: DC | PRN

## 2015-09-17 DIAGNOSIS — J449 Chronic obstructive pulmonary disease, unspecified: Secondary | ICD-10-CM | POA: Diagnosis not present

## 2015-10-03 ENCOUNTER — Encounter (INDEPENDENT_AMBULATORY_CARE_PROVIDER_SITE_OTHER): Payer: Self-pay

## 2015-10-03 ENCOUNTER — Ambulatory Visit (INDEPENDENT_AMBULATORY_CARE_PROVIDER_SITE_OTHER): Payer: Medicare Other | Admitting: Family Medicine

## 2015-10-03 ENCOUNTER — Encounter: Payer: Self-pay | Admitting: Family Medicine

## 2015-10-03 VITALS — BP 130/98 | HR 80 | Resp 18 | Ht 73.0 in | Wt 237.0 lb

## 2015-10-03 DIAGNOSIS — F17200 Nicotine dependence, unspecified, uncomplicated: Secondary | ICD-10-CM | POA: Diagnosis not present

## 2015-10-03 DIAGNOSIS — Z9119 Patient's noncompliance with other medical treatment and regimen: Secondary | ICD-10-CM | POA: Diagnosis not present

## 2015-10-03 DIAGNOSIS — E663 Overweight: Secondary | ICD-10-CM

## 2015-10-03 DIAGNOSIS — E785 Hyperlipidemia, unspecified: Secondary | ICD-10-CM

## 2015-10-03 DIAGNOSIS — I10 Essential (primary) hypertension: Secondary | ICD-10-CM | POA: Diagnosis not present

## 2015-10-03 DIAGNOSIS — M79603 Pain in arm, unspecified: Secondary | ICD-10-CM

## 2015-10-03 DIAGNOSIS — Z91199 Patient's noncompliance with other medical treatment and regimen due to unspecified reason: Secondary | ICD-10-CM

## 2015-10-03 MED ORDER — METOPROLOL SUCCINATE ER 50 MG PO TB24: 50.0000 mg | ORAL_TABLET | Freq: Every day | ORAL | 4 refills | Status: AC

## 2015-10-03 NOTE — Assessment & Plan Note (Signed)
Uncontrolled, voluntarily stopped medication, resume medication DASH diet and commitment to daily physical activity for a minimum of 30 minutes discussed and encouraged, as a part of hypertension management. The importance of attaining a healthy weight is also discussed.  BP/Weight 10/03/2015 06/06/2015 03/31/2015 01/27/2015 12/22/2014 11/23/2014 [id number]  Systolic BP [id number] 123456 98 0000000 Q000111Q Q000111Q  Diastolic BP 98 67 88 62 [id number] 96 98  Wt. (Lbs) 237 200 229 - - 238.8 220  BMI 31.27 26.39 30.22 - - 31.51 29.03  Some encounter information is confidential and restricted. Go to Review Flowsheets activity to see all data.

## 2015-10-03 NOTE — Patient Instructions (Addendum)
F/u end September or first week in October  REDUCE oxycodone to one TWICE DAILY starting today, which means that you have sufficient at home to l;ast for next 4 weeks, you may collect the new script the Friday before   Please work on stopping smoking  Stop nexium and continue zantac  I recommend that you resume mental health services, and for certain if you feel that you are a risk to yourself or anyone go to the emergency room  Please return stool cards for checking for hidden blood in stool, this is a part of colon cancer screening

## 2015-10-03 NOTE — Assessment & Plan Note (Signed)
Patient counseled for approximately 5 minutes regarding the health risks of ongoing nicotine use, specifically all types of cancer, heart disease, stroke and respiratory failure. The options available for help with cessation ,the behavioral changes to assist the process, and the option to either gradully reduce usage  Or abruptly stop.is also discussed. Pt is also encouraged to set specific goals in number of cigarettes used daily, as well as to set a quit date. Current is 1PPD

## 2015-10-12 ENCOUNTER — Telehealth: Payer: Self-pay | Admitting: Family Medicine

## 2015-10-12 DIAGNOSIS — Z9119 Patient's noncompliance with other medical treatment and regimen: Secondary | ICD-10-CM | POA: Insufficient documentation

## 2015-10-12 DIAGNOSIS — Z91199 Patient's noncompliance with other medical treatment and regimen due to unspecified reason: Secondary | ICD-10-CM | POA: Insufficient documentation

## 2015-10-12 NOTE — Telephone Encounter (Signed)
Pls cll pt and let him know that as we discussed at last visit , since he is "doing away with most  Of his med" Pain mill percocet was reduced to twice daily When he next collects, that will be last time forr 2 weeks only for once daily. If he cuts back now to once daily , he does not need to collect any script for that medication and it will be discontinued and that will be bEST  ?? pls ask

## 2015-10-12 NOTE — Assessment & Plan Note (Signed)
Deteriorated. Patient re-educated about  the importance of commitment to a  minimum of 150 minutes of exercise per week.  The importance of healthy food choices with portion control discussed. Encouraged to start a food diary, count calories and to consider  joining a support group. Sample diet sheets offered. Goals set by the patient for the next several months.   Weight /BMI 10/03/2015 06/06/2015 03/31/2015  WEIGHT 237 lb 200 lb 229 lb  HEIGHT 6\' 1"  6\' 1"  6\' 1"   BMI 31.27 kg/m2 26.39 kg/m2 30.22 kg/m2  Some encounter information is confidential and restricted. Go to Review Flowsheets activity to see all data.

## 2015-10-12 NOTE — Progress Notes (Signed)
Gavin Gray     MRN: [id number]      DOB: [birth year]   HPI Gavin Gray is here for annual exam which he refused, so he had  follow up and re-evaluation of chronic medical conditions, medication management and review of any available recent lab and radiology data.  Preventive health is updated, specifically  Cancer screening and Immunization.   Pt reports that he"lost  His home " recently when his wife left him, he has relocated to [facility] and is living with his daughter States he stopped all mental health medication in the past 3 months, does not intend to take it or re establish care , states he felt better than he has in a long time, denies being suicidal or homicidal  States I can "take  All of the medication away" ROS Denies recent fever or chills. Denies sinus pressure, nasal congestion, ear pain or sore throat. Denies chest congestion, productive cough or wheezing. Denies chest pains, palpitations and leg swelling Denies abdominal pain, nausea, vomiting,diarrhea or constipation.   Denies dysuria, frequency, hesitancy or incontinence. C/o chronic joint pain, swelling and limitation in mobility. Denies headaches, seizures, n Denies depression, anxiety or insomnia. Denies skin break down or rash.   PE  BP (!) 130/98   Pulse 80   Resp 18   Ht 6\' 1"  (1.854 m)   Wt 237 lb (107.5 kg)   SpO2 98%   BMI 31.27 kg/m   Patient alert and oriented and in no cardiopulmonary distress.  HEENT: No facial asymmetry, EOMI,   oropharynx pink and moist.  Neck supple no JVD, no mass.  Chest: Clear to auscultation bilaterally.  CVS: S1, S2 no murmurs, no S3.Regular rate.  ABD: Soft non tender.   Ext: No edema  [id number]  ROM spine, shoulders, hips and knees.  Skin: Intact, no ulcerations or rash noted.  Psych: Good eye contact, normal affect. Memory intact not anxious or depressed appearing.  CNS: CN 2-12 intact,   Assessment & Plan  Essential hypertension Uncontrolled,  voluntarily stopped medication, resume medication DASH diet and commitment to daily physical activity for a minimum of 30 minutes discussed and encouraged, as a part of hypertension management. The importance of attaining a healthy weight is also discussed.  BP/Weight 10/03/2015 06/06/2015 03/31/2015 01/27/2015 12/22/2014 11/23/2014 [id number]  Systolic BP [id number] 123456 98 0000000 Q000111Q Q000111Q  Diastolic BP 98 67 88 62 [id number] 96 98  Wt. (Lbs) 237 200 229 - - 238.8 220  BMI 31.27 26.39 30.22 - - 31.51 29.03  Some encounter information is confidential and restricted. Go to Review Flowsheets activity to see all data.       TOBACCO ABUSE Patient counseled for approximately 5 minutes regarding the health risks of ongoing nicotine use, specifically all types of cancer, heart disease, stroke and respiratory failure. The options available for help with cessation ,the behavioral changes to assist the process, and the option to either gradully reduce usage  Or abruptly stop.is also discussed. Pt is also encouraged to set specific goals in number of cigarettes used daily, as well as to set a quit date. Current is 1PPD    Medical non-compliance Established mental health illness and has decided to discontinue medication independently. Currently denies suicidal or homicidal ideation , reports living with his daughter Strongly encouraged and advised patient to resume mental health care, not interested at this time  ARM PAIN, LEFT Reduce oxycodone to one twice daily for next 4 weeks, then will get one  daily for an additional 2 weeks then discontinue oxycodone entirely  Hyperlipemia Hyperlipidemia:Low fat diet discussed and encouraged.   Lipid Panel  Lab Results  Component Value Date   CHOL 157 07/30/2013   HDL 25 (L) 07/30/2013   LDLCALC 103 (H) 07/30/2013   LDLDIRECT 138 (H) 01/08/2012   TRIG 146 07/30/2013   CHOLHDL 6.3 07/30/2013  Updated lab needed at/ before next visit.

## 2015-10-12 NOTE — Assessment & Plan Note (Addendum)
Established mental health illness and has decided to discontinue medication independently. Currently denies suicidal or homicidal ideation , reports living with his daughter Strongly encouraged and advised patient to resume mental health care, not interested at this time

## 2015-10-12 NOTE — Assessment & Plan Note (Signed)
Reduce oxycodone to one twice daily for next 4 weeks, then will get one daily for an additional 2 weeks then discontinue oxycodone entirely

## 2015-10-12 NOTE — Assessment & Plan Note (Signed)
Hyperlipidemia:Low fat diet discussed and encouraged.   Lipid Panel  Lab Results  Component Value Date   CHOL 157 07/30/2013   HDL 25 (L) 07/30/2013   LDLCALC 103 (H) 07/30/2013   LDLDIRECT 138 (H) 01/08/2012   TRIG 146 07/30/2013   CHOLHDL 6.3 07/30/2013  Updated lab needed at/ before next visit.

## 2015-10-17 NOTE — Telephone Encounter (Signed)
Called pt no answer °

## 2015-10-18 DIAGNOSIS — J449 Chronic obstructive pulmonary disease, unspecified: Secondary | ICD-10-CM | POA: Diagnosis not present

## 2015-10-28 ENCOUNTER — Telehealth: Payer: Self-pay

## 2015-10-28 ENCOUNTER — Other Ambulatory Visit: Payer: Self-pay

## 2015-10-28 ENCOUNTER — Other Ambulatory Visit: Payer: Self-pay | Admitting: Family Medicine

## 2015-10-28 DIAGNOSIS — G894 Chronic pain syndrome: Secondary | ICD-10-CM

## 2015-10-28 MED ORDER — OXYCODONE-ACETAMINOPHEN 10-325 MG PO TABS: ORAL_TABLET | ORAL | 0 refills | Status: DC

## 2015-10-28 MED ORDER — OXYCODONE-ACETAMINOPHEN 10-325 MG PO TABS: ORAL_TABLET | ORAL | 0 refills | Status: AC

## 2015-10-28 NOTE — Telephone Encounter (Signed)
noited

## 2015-10-28 NOTE — Telephone Encounter (Signed)
I have continued the twice daily dose based on pt c/o uncontrolled pain, he is also referred to pain management , pls refer, [facility]

## 2015-10-28 NOTE — Telephone Encounter (Signed)
Had been taking his oxycodone twice daily per the discussion at last visit and he states he is hurting so bad he can hardly go. Wants referral to pain management also.

## 2015-10-28 NOTE — Telephone Encounter (Signed)
[facility] is stating that they have a waiting list for referrals to be approved. I sent referral to Dr. Francesco Runner to see if his office could see him sooner

## 2015-10-28 NOTE — Telephone Encounter (Signed)
There is a pain clinic in [place], Dr Maryjean Ka?? I think look at that one please

## 2015-10-28 NOTE — Telephone Encounter (Signed)
Referral made to Dr. Clydell Hakim at [facility]

## 2015-10-28 NOTE — Telephone Encounter (Signed)
Pt aware.

## 2015-11-15 ENCOUNTER — Telehealth: Payer: Self-pay | Admitting: Family Medicine

## 2015-11-15 NOTE — Telephone Encounter (Signed)
LM to reschedule - Dr. Simpson PAL °

## 2015-11-18 DIAGNOSIS — J449 Chronic obstructive pulmonary disease, unspecified: Secondary | ICD-10-CM | POA: Diagnosis not present

## 2015-11-22 DIAGNOSIS — Z79899 Other long term (current) drug therapy: Secondary | ICD-10-CM | POA: Diagnosis not present

## 2015-11-22 DIAGNOSIS — M25561 Pain in right knee: Secondary | ICD-10-CM | POA: Diagnosis not present

## 2015-11-22 DIAGNOSIS — G8929 Other chronic pain: Secondary | ICD-10-CM | POA: Diagnosis not present

## 2015-11-22 DIAGNOSIS — M25511 Pain in right shoulder: Secondary | ICD-10-CM | POA: Diagnosis not present

## 2015-11-23 ENCOUNTER — Ambulatory Visit: Payer: Self-pay | Admitting: Family Medicine

## 2015-12-08 ENCOUNTER — Ambulatory Visit: Payer: Self-pay | Admitting: Family Medicine

## 2015-12-08 ENCOUNTER — Telehealth: Payer: Self-pay | Admitting: Family Medicine

## 2015-12-09 NOTE — Telephone Encounter (Signed)
Spoke directly with pt, unaware of missed appt reported appt on 10/13, new appt given to pt

## 2015-12-18 DIAGNOSIS — J449 Chronic obstructive pulmonary disease, unspecified: Secondary | ICD-10-CM | POA: Diagnosis not present

## 2015-12-21 ENCOUNTER — Ambulatory Visit: Payer: Self-pay | Admitting: Family Medicine

## 2015-12-21 ENCOUNTER — Encounter: Payer: Self-pay | Admitting: Family Medicine

## 2016-01-18 DIAGNOSIS — J449 Chronic obstructive pulmonary disease, unspecified: Secondary | ICD-10-CM | POA: Diagnosis not present

## 2016-02-17 DIAGNOSIS — J449 Chronic obstructive pulmonary disease, unspecified: Secondary | ICD-10-CM | POA: Diagnosis not present

## 2016-03-19 DIAGNOSIS — J449 Chronic obstructive pulmonary disease, unspecified: Secondary | ICD-10-CM | POA: Diagnosis not present

## 2016-03-21 ENCOUNTER — Telehealth: Payer: Self-pay | Admitting: Family Medicine

## 2016-03-21 NOTE — Telephone Encounter (Signed)
Left message that AWV need to be scheduled in MD's office if patient has not already been seen by [facility] home nurse.-ab

## 2016-04-19 DIAGNOSIS — J449 Chronic obstructive pulmonary disease, unspecified: Secondary | ICD-10-CM | POA: Diagnosis not present

## 2016-05-17 DIAGNOSIS — J449 Chronic obstructive pulmonary disease, unspecified: Secondary | ICD-10-CM | POA: Diagnosis not present

## 2016-06-07 DIAGNOSIS — G629 Polyneuropathy, unspecified: Secondary | ICD-10-CM | POA: Diagnosis not present

## 2016-06-07 DIAGNOSIS — S5411XS Injury of median nerve at forearm level, right arm, sequela: Secondary | ICD-10-CM | POA: Diagnosis not present

## 2016-06-07 DIAGNOSIS — M24542 Contracture, left hand: Secondary | ICD-10-CM | POA: Diagnosis not present

## 2016-06-07 DIAGNOSIS — G5622 Lesion of ulnar nerve, left upper limb: Secondary | ICD-10-CM | POA: Diagnosis not present

## 2016-06-17 DIAGNOSIS — J449 Chronic obstructive pulmonary disease, unspecified: Secondary | ICD-10-CM | POA: Diagnosis not present

## 2016-06-20 DIAGNOSIS — M25531 Pain in right wrist: Secondary | ICD-10-CM | POA: Diagnosis not present
